# Patient Record
Sex: Female | Born: 1935 | Race: White | Hispanic: No | Smoking: Former smoker
Health system: Southern US, Community
[De-identification: ages and names within clinical notes are randomized; demographics above are authoritative.]

## PROBLEM LIST (undated history)

## (undated) DIAGNOSIS — K219 Gastro-esophageal reflux disease without esophagitis: Secondary | ICD-10-CM

## (undated) DIAGNOSIS — C3490 Malignant neoplasm of unspecified part of unspecified bronchus or lung: Secondary | ICD-10-CM

## (undated) DIAGNOSIS — E079 Disorder of thyroid, unspecified: Secondary | ICD-10-CM

## (undated) DIAGNOSIS — E039 Hypothyroidism, unspecified: Secondary | ICD-10-CM

## (undated) DIAGNOSIS — M7550 Bursitis of unspecified shoulder: Secondary | ICD-10-CM

## (undated) DIAGNOSIS — M25519 Pain in unspecified shoulder: Secondary | ICD-10-CM

## (undated) DIAGNOSIS — Z923 Personal history of irradiation: Secondary | ICD-10-CM

## (undated) DIAGNOSIS — M67919 Unspecified disorder of synovium and tendon, unspecified shoulder: Secondary | ICD-10-CM

## (undated) DIAGNOSIS — M2550 Pain in unspecified joint: Secondary | ICD-10-CM

## (undated) DIAGNOSIS — F419 Anxiety disorder, unspecified: Secondary | ICD-10-CM

## (undated) DIAGNOSIS — J189 Pneumonia, unspecified organism: Secondary | ICD-10-CM

## (undated) DIAGNOSIS — M1990 Unspecified osteoarthritis, unspecified site: Secondary | ICD-10-CM

## (undated) DIAGNOSIS — H8110 Benign paroxysmal vertigo, unspecified ear: Secondary | ICD-10-CM

## (undated) DIAGNOSIS — J90 Pleural effusion, not elsewhere classified: Secondary | ICD-10-CM

## (undated) DIAGNOSIS — I10 Essential (primary) hypertension: Secondary | ICD-10-CM

## (undated) DIAGNOSIS — E785 Hyperlipidemia, unspecified: Secondary | ICD-10-CM

## (undated) DIAGNOSIS — R32 Unspecified urinary incontinence: Secondary | ICD-10-CM

## (undated) DIAGNOSIS — M719 Bursopathy, unspecified: Secondary | ICD-10-CM

## (undated) DIAGNOSIS — Z9289 Personal history of other medical treatment: Secondary | ICD-10-CM

## (undated) DIAGNOSIS — S52539A Colles' fracture of unspecified radius, initial encounter for closed fracture: Secondary | ICD-10-CM

## (undated) DIAGNOSIS — D649 Anemia, unspecified: Secondary | ICD-10-CM

## (undated) DIAGNOSIS — C3410 Malignant neoplasm of upper lobe, unspecified bronchus or lung: Secondary | ICD-10-CM

## (undated) DIAGNOSIS — F0390 Unspecified dementia without behavioral disturbance: Secondary | ICD-10-CM

## (undated) HISTORY — DX: Anxiety disorder, unspecified: F41.9

## (undated) HISTORY — DX: Hyperlipidemia, unspecified: E78.5

## (undated) HISTORY — PX: TONSILLECTOMY: SUR1361

## (undated) HISTORY — DX: Disorder of thyroid, unspecified: E07.9

## (undated) HISTORY — PX: CATARACT EXTRACTION: SUR2

## (undated) HISTORY — PX: EYE SURGERY: SHX253

## (undated) HISTORY — PX: FRACTURE SURGERY: SHX138

## (undated) HISTORY — DX: Unspecified osteoarthritis, unspecified site: M19.90

## (undated) HISTORY — PX: ABDOMINAL HYSTERECTOMY: SHX81

## (undated) HISTORY — DX: Essential (primary) hypertension: I10

## (undated) HISTORY — PX: CARPAL TUNNEL RELEASE: SHX101

## (undated) HISTORY — PX: CHOLECYSTECTOMY: SHX55

## (undated) HISTORY — PX: APPENDECTOMY: SHX54

## (undated) HISTORY — PX: OTHER SURGICAL HISTORY: SHX169

---

## 1998-05-14 ENCOUNTER — Ambulatory Visit (HOSPITAL_COMMUNITY): Admission: RE | Admit: 1998-05-14 | Discharge: 1998-05-14 | Payer: Self-pay | Admitting: Gastroenterology

## 1998-05-14 ENCOUNTER — Encounter: Payer: Self-pay | Admitting: Gastroenterology

## 2002-06-21 ENCOUNTER — Ambulatory Visit (HOSPITAL_COMMUNITY): Admission: RE | Admit: 2002-06-21 | Discharge: 2002-06-21 | Payer: Self-pay | Admitting: General Surgery

## 2002-12-07 ENCOUNTER — Ambulatory Visit: Admission: RE | Admit: 2002-12-07 | Discharge: 2002-12-07 | Payer: Self-pay | Admitting: Orthopedic Surgery

## 2002-12-07 ENCOUNTER — Encounter: Payer: Self-pay | Admitting: Orthopedic Surgery

## 2003-08-28 ENCOUNTER — Ambulatory Visit (HOSPITAL_COMMUNITY): Admission: RE | Admit: 2003-08-28 | Discharge: 2003-08-28 | Payer: Self-pay | Admitting: Family Medicine

## 2003-12-13 ENCOUNTER — Emergency Department (HOSPITAL_COMMUNITY): Admission: EM | Admit: 2003-12-13 | Discharge: 2003-12-13 | Payer: Self-pay | Admitting: Emergency Medicine

## 2003-12-17 ENCOUNTER — Ambulatory Visit (HOSPITAL_COMMUNITY): Admission: RE | Admit: 2003-12-17 | Discharge: 2003-12-17 | Payer: Self-pay | Admitting: Orthopedic Surgery

## 2003-12-17 ENCOUNTER — Encounter: Payer: Self-pay | Admitting: Orthopedic Surgery

## 2003-12-26 ENCOUNTER — Encounter (HOSPITAL_COMMUNITY): Admission: RE | Admit: 2003-12-26 | Discharge: 2003-12-27 | Payer: Self-pay | Admitting: Orthopedic Surgery

## 2004-01-01 ENCOUNTER — Encounter (HOSPITAL_COMMUNITY): Admission: RE | Admit: 2004-01-01 | Discharge: 2004-01-31 | Payer: Self-pay | Admitting: Orthopedic Surgery

## 2004-02-04 ENCOUNTER — Encounter (HOSPITAL_COMMUNITY): Admission: RE | Admit: 2004-02-04 | Discharge: 2004-03-05 | Payer: Self-pay | Admitting: Orthopedic Surgery

## 2004-03-12 ENCOUNTER — Ambulatory Visit: Payer: Self-pay | Admitting: Orthopedic Surgery

## 2004-06-08 ENCOUNTER — Ambulatory Visit: Payer: Self-pay | Admitting: Orthopedic Surgery

## 2004-07-01 ENCOUNTER — Ambulatory Visit (HOSPITAL_COMMUNITY): Admission: RE | Admit: 2004-07-01 | Discharge: 2004-07-01 | Payer: Self-pay | Admitting: Family Medicine

## 2004-10-21 ENCOUNTER — Ambulatory Visit (HOSPITAL_COMMUNITY): Admission: RE | Admit: 2004-10-21 | Discharge: 2004-10-21 | Payer: Self-pay | Admitting: Internal Medicine

## 2004-10-21 ENCOUNTER — Ambulatory Visit: Payer: Self-pay | Admitting: Internal Medicine

## 2004-12-11 ENCOUNTER — Ambulatory Visit (HOSPITAL_COMMUNITY): Admission: RE | Admit: 2004-12-11 | Discharge: 2004-12-11 | Payer: Self-pay | Admitting: Family Medicine

## 2005-06-07 ENCOUNTER — Ambulatory Visit: Payer: Self-pay | Admitting: Orthopedic Surgery

## 2006-05-20 ENCOUNTER — Ambulatory Visit (HOSPITAL_COMMUNITY): Admission: RE | Admit: 2006-05-20 | Discharge: 2006-05-20 | Payer: Self-pay | Admitting: Family Medicine

## 2006-10-21 ENCOUNTER — Ambulatory Visit (HOSPITAL_COMMUNITY): Admission: RE | Admit: 2006-10-21 | Discharge: 2006-10-21 | Payer: Self-pay | Admitting: Family Medicine

## 2006-11-03 ENCOUNTER — Ambulatory Visit: Payer: Self-pay | Admitting: Orthopedic Surgery

## 2006-12-01 ENCOUNTER — Ambulatory Visit: Payer: Self-pay | Admitting: Orthopedic Surgery

## 2007-06-27 ENCOUNTER — Ambulatory Visit: Payer: Self-pay | Admitting: Orthopedic Surgery

## 2007-06-27 DIAGNOSIS — M1710 Unilateral primary osteoarthritis, unspecified knee: Secondary | ICD-10-CM

## 2007-06-27 DIAGNOSIS — IMO0002 Reserved for concepts with insufficient information to code with codable children: Secondary | ICD-10-CM | POA: Insufficient documentation

## 2008-03-18 ENCOUNTER — Ambulatory Visit: Payer: Self-pay | Admitting: Orthopedic Surgery

## 2008-03-18 DIAGNOSIS — M25539 Pain in unspecified wrist: Secondary | ICD-10-CM

## 2008-05-24 ENCOUNTER — Ambulatory Visit (HOSPITAL_COMMUNITY): Admission: RE | Admit: 2008-05-24 | Discharge: 2008-05-24 | Payer: Self-pay | Admitting: Family Medicine

## 2008-06-05 ENCOUNTER — Encounter (HOSPITAL_COMMUNITY): Admission: RE | Admit: 2008-06-05 | Discharge: 2008-07-05 | Payer: Self-pay | Admitting: Family Medicine

## 2008-06-26 ENCOUNTER — Ambulatory Visit (HOSPITAL_COMMUNITY): Admission: RE | Admit: 2008-06-26 | Discharge: 2008-06-26 | Payer: Self-pay | Admitting: Urology

## 2008-12-03 ENCOUNTER — Ambulatory Visit: Payer: Self-pay | Admitting: Orthopedic Surgery

## 2008-12-03 DIAGNOSIS — S52539A Colles' fracture of unspecified radius, initial encounter for closed fracture: Secondary | ICD-10-CM | POA: Insufficient documentation

## 2008-12-03 HISTORY — DX: Colles' fracture of unspecified radius, initial encounter for closed fracture: S52.539A

## 2009-09-23 ENCOUNTER — Ambulatory Visit (HOSPITAL_COMMUNITY): Admission: RE | Admit: 2009-09-23 | Discharge: 2009-09-23 | Payer: Self-pay | Admitting: Family Medicine

## 2009-09-24 ENCOUNTER — Ambulatory Visit: Payer: Self-pay | Admitting: Orthopedic Surgery

## 2009-12-31 ENCOUNTER — Ambulatory Visit: Payer: Self-pay | Admitting: Orthopedic Surgery

## 2010-01-27 ENCOUNTER — Encounter: Payer: Self-pay | Admitting: Orthopedic Surgery

## 2010-03-12 ENCOUNTER — Ambulatory Visit (HOSPITAL_COMMUNITY)
Admission: RE | Admit: 2010-03-12 | Discharge: 2010-03-12 | Payer: Self-pay | Source: Home / Self Care | Attending: Ophthalmology | Admitting: Ophthalmology

## 2010-04-18 ENCOUNTER — Encounter: Payer: Self-pay | Admitting: Family Medicine

## 2010-04-20 ENCOUNTER — Ambulatory Visit (HOSPITAL_COMMUNITY)
Admission: RE | Admit: 2010-04-20 | Discharge: 2010-04-20 | Payer: Self-pay | Source: Home / Self Care | Attending: Ophthalmology | Admitting: Ophthalmology

## 2010-04-21 LAB — GLUCOSE, CAPILLARY: Glucose-Capillary: 93 mg/dL (ref 70–99)

## 2010-04-28 NOTE — Assessment & Plan Note (Signed)
Summary: WANTS INJECT IN BOTH KNEES XR  HERE  JUNE  '11/MCR/TRICARE/BSF   Visit Type:  Follow-up  CC:  bilateral knee pain.  History of Present Illness: I saw Peggy Espinoza in the office today for a followup visit.  She is a 77 years old woman with the complaint of:  knee pain, left is worse.  She has a history of osteoarthritis and today she is requesting injections  09/24/09 last injections bilateral knees, lasted for 3 months.  Meds: Synthroid, Metformin, Nexium, Advil as needed, helps.   bilateral knee pain LEFT greater than RIGHT patient would like to have 2 injections.  We also discussed her reluctance to have knee replacement surgery and this is secondary to a relative of her dying after knee surgery from a pulmonary embolus and then a friend of hers also had fracture of her knee area and also had the same issue  I advised her that we do for different things to prevent embolus including early mobilization, TED hose stockings, or medication and foot pumps  We then injected both knee joints using the following technique:  Verbal consent was obtained. The knee was prepped with alcohol and ethyl chloride. 1 cc of depomedrol 40mg /cc and 4 cc of lidocaine 1% was injected. there were no complications.          Allergies: No Known Drug Allergies   Impression & Recommendations:  Problem # 1:  KNEE, ARTHRITIS, DEGEN./OSTEO (ICD-715.96) Assessment Deteriorated  Orders: Joint Aspirate / Injection, Large (20610) Depo- Medrol 40mg  (J1030)  Patient Instructions: 1)  Please schedule a follow-up appointment as needed.

## 2010-04-28 NOTE — Letter (Signed)
Summary: [facility] medical record request  [facility] medical record request   Imported By: Jacklynn Ganong 02/26/2010 14:15:59  _____________________________________________________________________  External Attachment:    Type:   Image     Comment:   External Document

## 2010-04-28 NOTE — Assessment & Plan Note (Signed)
Summary: REQ INJECT IN BOTH KNEES NO RECENT XR/[facility]/TRICARE/BSF   Visit Type:  Follow-up  CC:  knee pain.  History of Present Illness: I saw Peggy Espinoza in the office today for a followup visit.  She is a 77 years old woman with the complaint of:  knee pain,   She has a history of osteoarthritis and today she is requesting injections  Last injections 02/2008.  Meds: Synthroid, Metformin, Nexium, Advil helps.  X-rays will be obtained as well. Both knees.      Allergies: No Known Drug Allergies  Past History:  Past Medical History: Last updated: 06/27/2007 high cholesterol nerves headaches knee pain  Past Surgical History: Last updated: 06/27/2007 Appendectomy hysterectomy tumor removed from side Tonsillectomy OTIF right wrist  Review of Systems       RIGHT wrist prominent screw from volar plating, not bothering her. Right now, she did ask if she needed a new x-ray, but we decided, that she's not having any surgery or having it removed. No need for extra radiation. No other difficulties in the wrist.    Physical Exam  Additional Exam:  The patient is well developed and nourished, with normal grooming and hygiene. The body habitus is Medium  The pulses and perfusion were normal with normal color, temperature  and no swelling In both lower extremities  skin normal in both lower extremities  RIGHT knee mild varus deformity, mild medial joint line tenderness. No effusion, mild flexion contracture, flexion, 120, knee stable  LEFT knee, mild varus deformity, medial joint line tenderness, mild effusion, mild flexion contracture, flexion, 120 knee is stable    Impression & Recommendations:  Problem # 1:  KNEE, ARTHRITIS, DEGEN./OSTEO (ICD-715.96) Assessment Deteriorated  AP and lateral with standing films, including patellofemoral views of both knees show LEFT knee has bone to bone changes with medial joint space na, alignment, varus, mild.  RIGHT  knee, not as bad, but still joint space compromise medially less varus.  Patellofemoral joints are aligned properly on the sunrise view.  Compared to previous films. There appears to be decreased joint space on the LEFT knee.   Orders: Est. Patient Level IV (04540) Depo- Medrol 40mg  (J1030) Joint Aspirate / Injection, Large (20610) Knees  x-ray bilateral ([id number]) Verbal consent was obtained. The knee was prepped with alcohol and ethyl chloride. 1 cc of depomedrol 40mg /cc and 4 cc of lidocaine 1% was injected. there were no complications.  both knees   Patient Instructions: 1)  You have received an injection of cortisone today. You may experience increased pain at the injection site. Apply ice pack to the area for 20 minutes every 2 hours and take 2 xtra strength tylenol every 8 hours. This increased pain will usually resolve in 24 hours. The injection will take effect in 3-10 days.  2)  follow up as needed

## 2010-06-08 LAB — BASIC METABOLIC PANEL
Calcium: 9.8 mg/dL (ref 8.4–10.5)
Chloride: 101 mEq/L (ref 96–112)

## 2010-06-08 LAB — HEMOGLOBIN AND HEMATOCRIT, BLOOD: HCT: 37.2 % (ref 36.0–46.0)

## 2010-08-14 NOTE — Op Note (Signed)
NAMEKEYONNI, PERCIVAL                 ACCOUNT NO.:  [id number]   MEDICAL RECORD NO.:  [id number]          PATIENT TYPE:  AMB   LOCATION:  DAY                           FACILITY:  [facility]   PHYSICIAN:  Vickki Hearing, M.D.DATE OF BIRTH:  Jan 28, 1930   DATE OF PROCEDURE:  DATE OF DISCHARGE:                                 OPERATIVE REPORT   A 77 year old female fell and fractured her right distal radius sustaining a  Smith's fracture. She was placed in a splint and scheduled for surgery.   INDICATIONS FOR PROCEDURE:  Displaced unstable right distal radius fracture.   PREOPERATIVE DIAGNOSIS:  Right distal radius fracture.   POSTOPERATIVE DIAGNOSIS:  Right distal radius fracture.   PROCEDURE:  Open treatment internal fixation with volar plate and a Stryker-  Leibinger plate set.   SURGEON:  Romeo Apple.   ANESTHESIA:  LMA general.   OPERATIVE FINDINGS:  Comminuted intra-articular distal radius fracture with  volar fragment.   DETAILS OF PROCEDURE:  The patient was identified in the preoperative  holding area. Her right upper extremity was marked as the surgical site. My  initials were placed over the surgical site as well. She was given  clindamycin due to penicillin allergy and taken to the operating room for  LMA general anesthesia. Closed manipulation of the wrist was obtained prior  to surgery. The right upper extremity was prepped and draped in the usual  sterile technique. A mandatory time out was taken. The procedure was  confirmed as open treatment internal fixation right distal radius on Jackee  Munday. After exsanguination of the limb and elevation of the tourniquet, a  volar incision was made over the FCR tendon. The radial artery was  protected. The dissection was carried down bluntly to the pronator quadratus  which was elevated from the distal radius. Open manipulation was performed.  The plate secured the reduction. Two screws were placed using a 2.0 drill  bit.  Radiographs confirmed the position of the hardware and the fracture  reduction in AP and lateral views. The wound was irrigated, closed with 2-0  Vicryl and staples. A volar splint was  applied, and the tourniquet was deflated. The patient was extubated, taken  to recovery room in stable condition. Plan is for her to go home today on  Lorcet Plus. She will follow up in 2 days for a dressing change. She will  start rehab after staples are taken out in 10 days.      SEH/MEDQ  D:  12/17/2003  T:  12/17/2003  Job:  [id number]

## 2010-08-14 NOTE — Op Note (Signed)
NAMEJEMEKA, WAGLER                 ACCOUNT NO.:  [id number]   MEDICAL RECORD NO.:  [id number]          PATIENT TYPE:  AMB   LOCATION:  DAY                           FACILITY:  [facility]   PHYSICIAN:  Lionel December, M.D.    DATE OF BIRTH:  Apr 21, 1929   DATE OF PROCEDURE:  10/21/2004  DATE OF DISCHARGE:                                 OPERATIVE REPORT   PROCEDURE:  Colonoscopy.   INDICATION:  Markeeta is a 77 year old Caucasian female who is here for  surveillance colonoscopy. She had a tubular adenoma removed in September  1998. She another polyp removed from cecum which was hyperplastic. She  apparently has gone eight years without an exam. Her father died of colon  carcinoma at age 22. Procedure risks were reviewed with the patient, and  informed consent was obtained.   PREMEDICATION:  Demerol 50 mg IV, Versed 12 mg IV.   FINDINGS:  Procedure performed in endoscopy suite. The patient's vital signs  and O2 saturation were monitored during the procedure and remained stable.  The patient was placed in left lateral position and rectal examination  performed. No abnormality noted on external or digital exam. Olympus  videoscope was placed in rectum and advanced under vision into sigmoid colon  beyond. Preparation was satisfactory. Redundant sigmoid colon. Scope had to  be withdrawn multiple times in order to prevent loop formation. Finally,  this was possible. Endoscope was advanced to cecum which was identified by  ileocecal valve. Blunt cecum was well seen. She had some stool which was  washed away. Pictures taken for the record. As the scope was withdrawn,  colonic mucosa was once again carefully examined. There was scattered small  diverticula throughout the colon, few in number, but there were no polyps or  other abnormalities. The rectal mucosa was normal. Scope was retroflexed to  examine anorectal junction and small hemorrhoids were noted below the  dentate line.  Endoscope was  straightened and withdrawn. The patient  tolerated the procedure well.   FINAL DIAGNOSIS:  Few small scattered diverticula throughout colon and small  external hemorrhoids.   No evidence of recurrent polyps.   RECOMMENDATIONS:  1.  High-fiber diet.  2.  She may consider next exam in five years given history of polyps and      family history of colon carcinoma.      NR/MEDQ  D:  10/21/2004  T:  10/21/2004  Job:  [id number]

## 2010-08-14 NOTE — H&P (Signed)
   NAMEAIDEN, RAO                             ACCOUNT NO.:  [id number]   MEDICAL RECORD NO.:  [id number]                  PATIENT TYPE:   LOCATION:                                       FACILITY:   PHYSICIAN:  Dalia Heading, M.D.               DATE OF BIRTH:  05-06-1929   DATE OF ADMISSION:  DATE OF DISCHARGE:                                HISTORY & PHYSICAL   CHIEF COMPLAINT:  History of colon polyps.  Family history of colon  carcinoma.   HISTORY OF PRESENT ILLNESS:  The patient is a 77 year old white female who  is referred for evaluation and treatment of a history of colon polyps and a  family history of colon carcinoma.  Her last colonoscopy was in 1999.  Her  father had a history of colon cancer. She denies any abdominal complaints.   PAST MEDICAL HISTORY:  Her past medical history includes hypothyroidism and  high cholesterol levels.   PAST SURGICAL HISTORY:  Hysterectomy, appendectomy, and tonsillectomy.   CURRENT MEDICATIONS:  1. Synthroid.  2. Zocor.  3. Glucosamine.   ALLERGIES:  No known drug allergies.   REVIEW OF SYSTEMS:  Noncontributory.   PHYSICAL EXAMINATION:  GENERAL:  On physical examination, the patient is a  well-developed well-nourished white female in no acute distress.  VITAL SIGNS:  She is afebrile and vital signs are stable.  LUNGS:  Clear to auscultation with equal breath sounds bilaterally.  HEART:  Heart examination reveals a regular rate and rhythm without S3, S4,  or murmurs.  ABDOMEN:  The abdomen is soft, nontender, nondistended.  No  hepatosplenomegaly, masses, or hernias are identified.  RECTAL:  Examination was deferred to the procedure.   IMPRESSION:  1. History of colon polyps.  2. Family history of colon carcinoma.    PLAN:  The patient is scheduled for a colonoscopy on 06/19/2002.  The risks  and benefits of the procedure including bleeding and perforation were fully  explained to the patient, who gave informed  consent.                                               Dalia Heading, M.D.    MAJ/MEDQ  D:  06/12/2002  T:  06/12/2002  Job:  [id number]   cc:   Kirk Ruths, M.D.  [address]  [place]  [place] [address]  Fax: [phone]

## 2011-01-08 ENCOUNTER — Other Ambulatory Visit (HOSPITAL_COMMUNITY): Payer: Self-pay | Admitting: Family Medicine

## 2011-01-08 DIAGNOSIS — Z139 Encounter for screening, unspecified: Secondary | ICD-10-CM

## 2011-01-11 ENCOUNTER — Other Ambulatory Visit (HOSPITAL_COMMUNITY): Payer: Self-pay

## 2011-01-15 ENCOUNTER — Ambulatory Visit (HOSPITAL_COMMUNITY)
Admission: RE | Admit: 2011-01-15 | Discharge: 2011-01-15 | Disposition: A | Discharge status: Home / Self Care | Payer: Medicare Other | Source: Ambulatory Visit | Attending: Family Medicine | Admitting: Family Medicine

## 2011-01-15 DIAGNOSIS — M818 Other osteoporosis without current pathological fracture: Secondary | ICD-10-CM | POA: Insufficient documentation

## 2011-01-15 DIAGNOSIS — Z780 Asymptomatic menopausal state: Secondary | ICD-10-CM | POA: Insufficient documentation

## 2011-01-15 DIAGNOSIS — Z139 Encounter for screening, unspecified: Secondary | ICD-10-CM

## 2011-01-26 ENCOUNTER — Encounter: Payer: Self-pay | Admitting: Orthopedic Surgery

## 2011-01-26 ENCOUNTER — Ambulatory Visit (INDEPENDENT_AMBULATORY_CARE_PROVIDER_SITE_OTHER): Payer: Medicare Other | Admitting: Orthopedic Surgery

## 2011-01-26 VITALS — BP 130/90 | Ht 63.5 in | Wt 153.0 lb

## 2011-01-26 DIAGNOSIS — M25519 Pain in unspecified shoulder: Secondary | ICD-10-CM

## 2011-01-26 HISTORY — DX: Pain in unspecified shoulder: M25.519

## 2011-01-26 MED ORDER — HYDROCODONE-ACETAMINOPHEN 5-325 MG PO TABS: 1.0000 | ORAL_TABLET | Freq: Four times a day (QID) | ORAL | Status: AC | PRN

## 2011-01-26 MED ORDER — METHYLPREDNISOLONE ACETATE 40 MG/ML IJ SUSP: 40.0000 mg | Freq: Once | INTRAMUSCULAR | Status: DC

## 2011-01-26 NOTE — Patient Instructions (Signed)
You have received a steroid shot. 15% of patients experience increased pain at the injection site with in the next 24 hours. This is best treated with ice and tylenol extra strength 2 tabs every 8 hours. If you are still having pain please call the office.    

## 2011-01-26 NOTE — Progress Notes (Signed)
RIGHT shoulder pain x2 weeks.  The patient says that she fell out of the bed. Her RIGHT shoulder hit the corner of the end table, and she's had pain and loss of motion since been unrelieved by N. Phil  Review of systems easy bleeding and bruising seasonal allergies, joint pain, the other 11 systems are normal.  Past Medical History  Diagnosis Date  . HTN (hypertension)   . Hyperlipidemia   . Thyroid disease   . Diabetes mellitus     Past Surgical History  Procedure Date  . Arm fracture surgery   . Tonsillectomy   . Appendectomy   . Abdominal hysterectomy   . Lipoma removal   . Eye surgery     Physical Exam(12) GENERAL: normal development   CDV: pulses are normal   Skin: normal  Lymph: nodes were not palpable/normal  Psychiatric: awake, alert and oriented  Neuro: normal sensation  MSK Ambulation is normal 1 RIGHT shoulder is nontender over the a.c. Joint and subacromial space 2 Painful forward elevation, passive range of motion, 150 3 Mild weakness. Rotator cuff 4 No instability 5 Positive impingement sign 6 Negative abduction sign  Separate x-ray report 2 views, RIGHT shoulder There is no gross deformity or fracture. There is calcification in the subacromial space with a type II acromion.  Impression no acute fracture  Assessment: Shoulder pain with possible impingement syndrome, posttraumatic    Plan: Injection oral analgesic  Shoulder Injection Procedure Note   Pre-operative Diagnosis: right  RC Syndrome  Post-operative Diagnosis: same  Indications: pain   Anesthesia: ethyl chloride   Procedure Details   Verbal consent was obtained for the procedure. The shoulder was prepped withalcohol and the skin was anesthetized. A 20 gauge needle was advanced into the subacromial space through posterior approach without difficulty  The space was then injected with 3 ml 1% lidocaine and 1 ml of depomedrol. The injection site was cleansed with isopropyl  alcohol and a dressing was applied.  Complications:  None; patient tolerated the procedure well.

## 2011-03-10 ENCOUNTER — Encounter: Payer: Self-pay | Admitting: Orthopedic Surgery

## 2011-03-10 ENCOUNTER — Ambulatory Visit (INDEPENDENT_AMBULATORY_CARE_PROVIDER_SITE_OTHER): Payer: TRICARE For Life (TFL) | Admitting: Orthopedic Surgery

## 2011-03-10 VITALS — BP 110/60 | Ht 63.5 in | Wt 153.0 lb

## 2011-03-10 DIAGNOSIS — M5410 Radiculopathy, site unspecified: Secondary | ICD-10-CM

## 2011-03-10 DIAGNOSIS — M5412 Radiculopathy, cervical region: Secondary | ICD-10-CM

## 2011-03-10 MED ORDER — HYDROCODONE-ACETAMINOPHEN 5-325 MG PO TABS: 1.0000 | ORAL_TABLET | Freq: Four times a day (QID) | ORAL | Status: AC | PRN

## 2011-03-10 MED ORDER — PREDNISONE 10 MG PO KIT: 10.0000 mg | PACK | ORAL | Status: DC

## 2011-03-10 MED ORDER — GABAPENTIN 100 MG PO CAPS: 100.0000 mg | ORAL_CAPSULE | Freq: Three times a day (TID) | ORAL | Status: DC

## 2011-03-10 NOTE — Patient Instructions (Signed)
Take new meds as directed

## 2011-03-10 NOTE — Progress Notes (Signed)
Right shoulder pain  Right shoulder injection did not tell  Complaint of right arm pain radiating to the right wrist  Normal forward elevation without pain normal range across the chest without pain  Denies neck pain  Reexamination  Normal appearance, alert and oriented x3 mood and affect normal. Ambulation normal. Right shoulder full range of motion. Negative impingement sign. Negative Hawkins sign. Negative cross chest abduction test. Range of motion elbow wrist and hand.  Suspect radicular pain  Recommend Norco, steroid Dosepak and gabapentin. Followup Monday.

## 2011-03-15 ENCOUNTER — Ambulatory Visit (INDEPENDENT_AMBULATORY_CARE_PROVIDER_SITE_OTHER): Payer: TRICARE For Life (TFL) | Admitting: Orthopedic Surgery

## 2011-03-15 ENCOUNTER — Encounter: Payer: Self-pay | Admitting: Orthopedic Surgery

## 2011-03-15 DIAGNOSIS — M25519 Pain in unspecified shoulder: Secondary | ICD-10-CM

## 2011-03-15 DIAGNOSIS — IMO0002 Reserved for concepts with insufficient information to code with codable children: Secondary | ICD-10-CM

## 2011-03-15 DIAGNOSIS — M792 Neuralgia and neuritis, unspecified: Secondary | ICD-10-CM

## 2011-03-15 NOTE — Patient Instructions (Signed)
Continue the gabapentin 100 mg 3 times a day

## 2011-03-15 NOTE — Progress Notes (Signed)
Patient ID: Peggy Espinoza, female   DOB: Jan 26, 1930, 77 y.o.   MRN: [id number] Visit after starting oral Neurontin and prednisone Dosepak for RIGHT upper extremity pain suspected radiculopathy  Patient notes major improvement just some stiffness now in the shoulder most of her arm pain has resolved she is getting a little bit sleepy from the medication.  But today she has full forward elevation.  Continue medication Neurontin 100 mg 3 times a day until this bottle has been finished.

## 2011-04-07 ENCOUNTER — Encounter: Payer: Self-pay | Admitting: Orthopedic Surgery

## 2011-04-07 ENCOUNTER — Ambulatory Visit (INDEPENDENT_AMBULATORY_CARE_PROVIDER_SITE_OTHER): Payer: Medicare Other | Admitting: Orthopedic Surgery

## 2011-04-07 VITALS — BP 100/60 | Ht 63.5 in | Wt 153.0 lb

## 2011-04-07 DIAGNOSIS — S43429A Sprain of unspecified rotator cuff capsule, initial encounter: Secondary | ICD-10-CM

## 2011-04-07 DIAGNOSIS — M75100 Unspecified rotator cuff tear or rupture of unspecified shoulder, not specified as traumatic: Secondary | ICD-10-CM

## 2011-04-07 MED ORDER — PREDNISONE 5 MG PO KIT: 5.0000 mg | PACK | ORAL | Status: DC

## 2011-04-07 MED ORDER — HYDROCODONE-ACETAMINOPHEN 5-500 MG PO TABS: 1.0000 | ORAL_TABLET | ORAL | Status: DC | PRN

## 2011-04-07 MED ORDER — GABAPENTIN 100 MG PO CAPS: 100.0000 mg | ORAL_CAPSULE | Freq: Three times a day (TID) | ORAL | Status: DC

## 2011-04-07 NOTE — Progress Notes (Signed)
Patient ID: Peggy Espinoza, female   DOB: 1929-08-23, 78 y.o.   MRN: [id number] Chief Complaint  Patient presents with  . Follow-up    Still having right shoulder pain, meds did not help.   She says the medications did not help but needed help her when she stopped taking them her pain came back.  Sheet she complains of pain over the RIGHT deltoid radiating down the RIGHT arm into the RIGHT wrist, she complains of loss of motion, she describes the pain as a dull ache, she rates the pain as severe, she reports weakness in the RIGHT upper extremity.  Review of systems she denies neck pain, numbness or tendon  Past Medical History  Diagnosis Date  . HTN (hypertension)   . Hyperlipidemia   . Thyroid disease   . Diabetes mellitus     Exam she is a well-developed well-nourished female who is awake alert and oriented x3 her mood and affect are normal.  She has normal ambulation.  His cervical spine is nontender with no deformity range of motion is full and supple with no instability and normal muscle tone the scan is normal without rash or pigmented lesion  The RIGHT shoulder is tender over the lateral and anterior deltoid musculature with decreased active range of motion.  Passive external rotation 45, passive abduction 45, passive forward elevation 130.  Shoulder stability normal.  Strength could not be assessed in the shoulder cuff muscles.  Skin intact.  Distally her pulses were normal, the temperature of the arm was normal.  There is no edema, swelling or varicosities  The lymph nodes in the axilla were normal the supraclavicular region normal.  Sensation was normal.  Pathologic reflexes such as a Hoffman test were normal.  Overall balance was normal.  X-rays were taken prior to this and are included in previous notes there was no glenohumeral arthritis  Impression differential diagnosis of rotator cuff syndrome still not resolved vs. Partial thickness rotator cuff tear.  Note, I was able to  elevate the arm to 130 but she had significant pain and giving way when the arm was brought down to her side-this is indicative of partial tearing of the cuff usually on the articular surface side.  We repeated her injection and started her on Vicodin, repeated her prednisone and Neurontin which she responded to well last time  Recommend MRI of RIGHT shoulder to evaluate the rotator cuff repair  Return 2 weeks  Open unit needed. Subacromial Shoulder Injection Procedure Note  Pre-operative Diagnosis: right shoulder pain , RC SYNDROME , ? tear  Post-operative Diagnosis: same  Indications: pain   Anesthesia: not required na  Procedure Details   Verbal consent was obtained for the procedure. The shoulder was prepped withalcohol and the skin was anesthetized. A 20 gauge needle was advanced into the subacromial space through posterior approach without difficulty  The space was then injected with 3 ml 1% lidocaine and 1 ml of depomedrol. The injection site was cleansed with isopropyl alcohol and a dressing was applied.  Complications:  None; patient tolerated the procedure well.

## 2011-04-07 NOTE — Patient Instructions (Signed)
MRI OF YOUR SHOULDER WE WILL CALL YOU WITH YOUR APPOINTMENT

## 2011-04-26 ENCOUNTER — Telehealth: Payer: Self-pay | Admitting: Radiology

## 2011-04-26 NOTE — Telephone Encounter (Signed)
I called to give the patient her MRI appointment at [facility] on 05-03-11 at 1:45. Patient has [employer], authorization [id number]. Patient will follow up back at our office to go over her results.

## 2011-04-27 ENCOUNTER — Ambulatory Visit: Payer: Medicare Other | Admitting: Orthopedic Surgery

## 2011-04-30 ENCOUNTER — Encounter: Payer: Self-pay | Admitting: Radiology

## 2011-04-30 NOTE — Telephone Encounter (Signed)
error 

## 2011-05-03 ENCOUNTER — Ambulatory Visit
Admission: RE | Admit: 2011-05-03 | Discharge: 2011-05-03 | Disposition: A | Discharge status: Home / Self Care | Payer: Medicare Other | Source: Ambulatory Visit | Attending: Orthopedic Surgery | Admitting: Orthopedic Surgery

## 2011-05-03 DIAGNOSIS — M75100 Unspecified rotator cuff tear or rupture of unspecified shoulder, not specified as traumatic: Secondary | ICD-10-CM

## 2011-05-06 ENCOUNTER — Ambulatory Visit (INDEPENDENT_AMBULATORY_CARE_PROVIDER_SITE_OTHER): Payer: Medicare Other | Admitting: Orthopedic Surgery

## 2011-05-06 ENCOUNTER — Encounter: Payer: Self-pay | Admitting: Orthopedic Surgery

## 2011-05-06 VITALS — Ht 63.5 in | Wt 153.0 lb

## 2011-05-06 DIAGNOSIS — S43429A Sprain of unspecified rotator cuff capsule, initial encounter: Secondary | ICD-10-CM

## 2011-05-06 DIAGNOSIS — M75100 Unspecified rotator cuff tear or rupture of unspecified shoulder, not specified as traumatic: Secondary | ICD-10-CM

## 2011-05-06 NOTE — Progress Notes (Signed)
Subjective:     Patient ID: Peggy Espinoza, female   DOB: Aug 12, 1929, 78 y.o.   MRN: [id number]  HPI Followup  Diagnosis rotator cuff tear RIGHT shoulder   Review of Systems     Objective:   Physical Exam Active range of motion of 150 of flexion with weakness in the supraspinatus    Assessment:     MRI shows complete supraspinatus tendon tear with retraction to the top of the humeral head estimated 4 cm no atrophy.  There is tendinitis and tendinopathy of the infraspinatus and a.c. Joint arthrosis mild, type III acromion with subacromial spur.    Plan:     The patient feels that she has recovered significantly and has only intermittent pain.  She can perform for elevation with mild discomfort and would like to continue nonoperative treatment.  On agreement with this.  We discussed her surgical options.  She will need skilled care and if she decides to have surgery.  If things get worse she will call us back.

## 2011-05-06 NOTE — Patient Instructions (Signed)
Prn

## 2011-06-22 ENCOUNTER — Telehealth: Payer: Self-pay | Admitting: Orthopedic Surgery

## 2011-06-22 NOTE — Telephone Encounter (Signed)
Patient had stopped into office to relay that the same shoulder is hurting again.  Notes that Dr. Romeo Apple had discussed options at last visit to continue non-surgical treatment and to return as needed.  She is requesting another injection if possible, and would like to "talk with Dr. Romeo Apple."  Please advise.  Her home ph# is [phone].

## 2011-06-23 NOTE — Telephone Encounter (Signed)
Ill have to see her when I come back

## 2011-06-23 NOTE — Telephone Encounter (Signed)
Called back to patient, appointment scheduled. °

## 2011-07-12 ENCOUNTER — Ambulatory Visit (INDEPENDENT_AMBULATORY_CARE_PROVIDER_SITE_OTHER): Payer: Medicare Other | Admitting: Orthopedic Surgery

## 2011-07-12 ENCOUNTER — Encounter: Payer: Self-pay | Admitting: Orthopedic Surgery

## 2011-07-12 VITALS — BP 100/70 | Ht 63.5 in | Wt 158.0 lb

## 2011-07-12 DIAGNOSIS — M5410 Radiculopathy, site unspecified: Secondary | ICD-10-CM

## 2011-07-12 DIAGNOSIS — S43429A Sprain of unspecified rotator cuff capsule, initial encounter: Secondary | ICD-10-CM

## 2011-07-12 DIAGNOSIS — M75100 Unspecified rotator cuff tear or rupture of unspecified shoulder, not specified as traumatic: Secondary | ICD-10-CM

## 2011-07-12 MED ORDER — PREDNISONE 5 MG PO KIT: 5.0000 mg | PACK | ORAL | Status: DC

## 2011-07-12 NOTE — Patient Instructions (Signed)
You have received a steroid shot. 15% of patients experience increased pain at the injection site with in the next 24 hours. This is best treated with ice and tylenol extra strength 2 tabs every 8 hours. If you are still having pain please call the office.    

## 2011-07-12 NOTE — Progress Notes (Signed)
Patient ID: Raelyn Number, female   DOB: Jul 25, 1929, 78 y.o.   MRN: [id number] Chief Complaint  Patient presents with  . Follow-up    recheck right shoulder, still hurting   Followup RIGHT shoulder MRI showed complete supraspinatus tendon tear with retraction estimated 4 cm no atrophy  Still complains of RIGHT shoulder pain  Has good forward elevation and she does have some strength in the cuff.  She also complained of some knee pain but basically she would like another injection  Shoulder Injection Procedure Note   Pre-operative Diagnosis: right  RC Syndrome  Post-operative Diagnosis: same  Indications: pain   Anesthesia: ethyl chloride   Procedure Details   Verbal consent was obtained for the procedure. The shoulder was prepped withalcohol and the skin was anesthetized. A 20 gauge needle was advanced into the subacromial space through posterior approach without difficulty  The space was then injected with 3 ml 1% lidocaine and 1 ml of depomedrol. The injection site was cleansed with isopropyl alcohol and a dressing was applied.  Complications:  None; patient tolerated the procedure well.

## 2011-10-06 ENCOUNTER — Ambulatory Visit (INDEPENDENT_AMBULATORY_CARE_PROVIDER_SITE_OTHER): Payer: Medicare Other | Admitting: Orthopedic Surgery

## 2011-10-06 ENCOUNTER — Encounter: Payer: Self-pay | Admitting: Orthopedic Surgery

## 2011-10-06 VITALS — BP 90/60 | Ht 63.5 in | Wt 157.0 lb

## 2011-10-06 DIAGNOSIS — M1710 Unilateral primary osteoarthritis, unspecified knee: Secondary | ICD-10-CM

## 2011-10-06 DIAGNOSIS — M179 Osteoarthritis of knee, unspecified: Secondary | ICD-10-CM | POA: Insufficient documentation

## 2011-10-06 NOTE — Progress Notes (Signed)
Patient ID: Peggy Espinoza, female   DOB: 30-Aug-1929, 78 y.o.   MRN: [id number] Chief Complaint  Patient presents with  . Injections    requests bilateral knee injections    BP 90/60  Ht 5' 3.5" (1.613 m)  Wt 157 lb (71.215 kg)  BMI 27.38 kg/m2  Knee  Injection Procedure Note  Pre-operative Diagnosis: left knee oa  Post-operative Diagnosis: same  Indications: pain  Anesthesia: ethyl chloride   Procedure Details   Verbal consent was obtained for the procedure. Time out was completed.The joint was prepped with alcohol, followed by  Ethyl chloride spray and A 20 gauge needle was inserted into the knee via lateral approach; 4ml 1% lidocaine and 1 ml of depomedrol  was then injected into the joint . The needle was removed and the area cleansed and dressed.  Complications:  None; patient tolerated the procedure well. Knee  Injection Procedure Note  Pre-operative Diagnosis: right knee oa  Post-operative Diagnosis: same  Indications: pain  Anesthesia: ethyl chloride   Procedure Details   Verbal consent was obtained for the procedure. Time out was completed.The joint was prepped with alcohol, followed by  Ethyl chloride spray and A 20 gauge needle was inserted into the knee via lateral approach; 4ml 1% lidocaine and 1 ml of depomedrol  was then injected into the joint . The needle was removed and the area cleansed and dressed.  Complications:  None; patient tolerated the procedure well.

## 2012-01-06 ENCOUNTER — Encounter: Payer: Self-pay | Admitting: Orthopedic Surgery

## 2012-01-06 ENCOUNTER — Ambulatory Visit (INDEPENDENT_AMBULATORY_CARE_PROVIDER_SITE_OTHER): Payer: Medicare Other | Admitting: Orthopedic Surgery

## 2012-01-06 VITALS — BP 88/60 | Ht 63.5 in | Wt 157.0 lb

## 2012-01-06 DIAGNOSIS — M1710 Unilateral primary osteoarthritis, unspecified knee: Secondary | ICD-10-CM

## 2012-01-06 DIAGNOSIS — M25569 Pain in unspecified knee: Secondary | ICD-10-CM

## 2012-01-06 DIAGNOSIS — IMO0002 Reserved for concepts with insufficient information to code with codable children: Secondary | ICD-10-CM

## 2012-01-06 MED ORDER — HYDROCODONE-ACETAMINOPHEN 10-325 MG PO TABS: 1.0000 | ORAL_TABLET | Freq: Four times a day (QID) | ORAL | Status: DC | PRN

## 2012-01-06 NOTE — Progress Notes (Signed)
Patient ID: Raelyn Number, female   DOB: 02/07/1930, 78 y.o.   MRN: [id number] Chief Complaint  Patient presents with  . Follow-up    Recheck, requests knee injection.    Knee  Injection Procedure Note  Pre-operative Diagnosis: left knee oa  Post-operative Diagnosis: same  Indications: pain  Anesthesia: ethyl chloride   Procedure Details   Verbal consent was obtained for the procedure. Time out was completed.The joint was prepped with alcohol, followed by  Ethyl chloride spray and A 20 gauge needle was inserted into the knee via lateral approach; 4ml 1% lidocaine and 1 ml of depomedrol  was then injected into the joint . The needle was removed and the area cleansed and dressed.  Complications:  None; patient tolerated the procedure well.  Knee  Injection Procedure Note  Pre-operative Diagnosis: right knee oa  Post-operative Diagnosis: same  Indications: pain  Anesthesia: ethyl chloride   Procedure Details   Verbal consent was obtained for the procedure. Time out was completed.The joint was prepped with alcohol, followed by  Ethyl chloride spray and A 20 gauge needle was inserted into the knee via lateral approach; 4ml 1% lidocaine and 1 ml of depomedrol  was then injected into the joint . The needle was removed and the area cleansed and dressed.  Complications:  None; patient tolerated the procedure well.

## 2012-01-06 NOTE — Patient Instructions (Addendum)
You have received a steroid shot. 15% of patients experience increased pain at the injection site with in the next 24 hours. This is best treated with ice and tylenol extra strength 2 tabs every 8 hours. If you are still having pain please call the office.    

## 2012-02-14 ENCOUNTER — Encounter (INDEPENDENT_AMBULATORY_CARE_PROVIDER_SITE_OTHER): Payer: Self-pay

## 2012-08-01 ENCOUNTER — Encounter: Payer: Self-pay | Admitting: Orthopedic Surgery

## 2012-08-01 ENCOUNTER — Ambulatory Visit (INDEPENDENT_AMBULATORY_CARE_PROVIDER_SITE_OTHER): Payer: Medicare Other | Admitting: Orthopedic Surgery

## 2012-08-01 VITALS — BP 130/78 | Ht 63.5 in | Wt 157.0 lb

## 2012-08-01 DIAGNOSIS — M2550 Pain in unspecified joint: Secondary | ICD-10-CM

## 2012-08-01 DIAGNOSIS — M1710 Unilateral primary osteoarthritis, unspecified knee: Secondary | ICD-10-CM

## 2012-08-01 HISTORY — DX: Pain in unspecified joint: M25.50

## 2012-08-01 MED ORDER — PREDNISONE 5 MG PO KIT: 5.0000 mg | PACK | ORAL | Status: DC

## 2012-08-01 NOTE — Progress Notes (Signed)
Patient ID: Raelyn Number, female   DOB: [birth year], 79 y.o.   MRN: [id number] Chief Complaint  Patient presents with  . Follow-up    follow up bilateral knees requst injections    Knee  Injection Procedure Note  Pre-operative Diagnosis: left knee oa  Post-operative Diagnosis: same  Indications: pain  Anesthesia: ethyl chloride   Procedure Details   Verbal consent was obtained for the procedure. Time out was completed.The joint was prepped with alcohol, followed by  Ethyl chloride spray and A 20 gauge needle was inserted into the knee via lateral approach; 4ml 1% lidocaine and 1 ml of depomedrol  was then injected into the joint . The needle was removed and the area cleansed and dressed.  Complications:  None; patient tolerated the procedure well. Knee  Injection Procedure Note  Pre-operative Diagnosis: right knee oa  Post-operative Diagnosis: same  Indications: pain  Anesthesia: ethyl chloride   Procedure Details   Verbal consent was obtained for the procedure. Time out was completed.The joint was prepped with alcohol, followed by  Ethyl chloride spray and A 20 gauge needle was inserted into the knee via lateral approach; 4ml 1% lidocaine and 1 ml of depomedrol  was then injected into the joint . The needle was removed and the area cleansed and dressed.  Complications:  None; patient tolerated the procedure well.

## 2012-08-01 NOTE — Addendum Note (Signed)
Addended by: Vickki Hearing on: 08/01/2012 09:43 AM   Modules accepted: Orders

## 2012-08-01 NOTE — Patient Instructions (Addendum)
You have received a steroid shot. 15% of patients experience increased pain at the injection site with in the next 24 hours. This is best treated with ice and tylenol extra strength 2 tabs every 8 hours. If you are still having pain please call the office.    

## 2012-10-09 ENCOUNTER — Other Ambulatory Visit (HOSPITAL_COMMUNITY): Payer: Self-pay | Admitting: Family Medicine

## 2012-10-09 ENCOUNTER — Ambulatory Visit (HOSPITAL_COMMUNITY)
Admission: RE | Admit: 2012-10-09 | Discharge: 2012-10-09 | Disposition: A | Discharge status: Home / Self Care | Payer: Medicare Other | Source: Ambulatory Visit | Attending: Family Medicine | Admitting: Family Medicine

## 2012-10-09 DIAGNOSIS — M19019 Primary osteoarthritis, unspecified shoulder: Secondary | ICD-10-CM

## 2012-10-09 DIAGNOSIS — M25519 Pain in unspecified shoulder: Secondary | ICD-10-CM | POA: Insufficient documentation

## 2012-10-26 ENCOUNTER — Ambulatory Visit (INDEPENDENT_AMBULATORY_CARE_PROVIDER_SITE_OTHER): Payer: Medicare Other | Admitting: Orthopedic Surgery

## 2012-10-26 ENCOUNTER — Encounter: Payer: Self-pay | Admitting: Orthopedic Surgery

## 2012-10-26 VITALS — BP 117/70 | Ht 63.5 in | Wt 157.0 lb

## 2012-10-26 DIAGNOSIS — M7030 Other bursitis of elbow, unspecified elbow: Secondary | ICD-10-CM | POA: Insufficient documentation

## 2012-10-26 DIAGNOSIS — M7550 Bursitis of unspecified shoulder: Secondary | ICD-10-CM

## 2012-10-26 DIAGNOSIS — M67919 Unspecified disorder of synovium and tendon, unspecified shoulder: Secondary | ICD-10-CM

## 2012-10-26 DIAGNOSIS — M7020 Olecranon bursitis, unspecified elbow: Secondary | ICD-10-CM

## 2012-10-26 DIAGNOSIS — M7022 Olecranon bursitis, left elbow: Secondary | ICD-10-CM

## 2012-10-26 DIAGNOSIS — M7552 Bursitis of left shoulder: Secondary | ICD-10-CM

## 2012-10-26 HISTORY — DX: Bursitis of unspecified shoulder: M75.50

## 2012-10-26 NOTE — Progress Notes (Signed)
Patient ID: Raelyn Number, female   DOB: Sep 17, 1929, 79 y.o.   MRN: [id number] Chief Complaint  Patient presents with  . Shoulder Pain    Left shoulder pain. Referred by DR. Phillips Odor  . Elbow Pain    Swelling and pain left elbow   Patient history: Left shoulder pain. Left arm pain. Left elbow pain and swelling. Pain started a few weeks ago No injury Sharp 10 out of 10 Constant Improved by Aleve  Review of Systems  Constitutional: Positive for weight loss.  Gastrointestinal: Positive for nausea.  Neurological:       Anxiety nervousness  Endo/Heme/Allergies: Bruises/bleeds easily.  Psychiatric/Behavioral: The patient is nervous/anxious.   All other systems reviewed and are negative.     BP 117/70  Ht 5' 3.5" (1.613 m)  Wt 157 lb (71.215 kg)  BMI 27.37 kg/m2 General appearance is normal, the patient is alert and oriented x3 with normal mood and affect. Body habitus is medium Ambulation is slow stride lengths are diminished there is no limp  Left upper extremity she has tenderness over the deltoid insertion in the peri-acromial region with decreased active range of motion normal painful passive range of motion with normal shoulder stability. Drop arm test was negative but on descent of the arm she had pain at about 90 of flexion which could indicate partial tearing of the rotator cuff which is expected in this age group. Overall strength and muscle tone were normal skin was intact there was a large bursal swelling and tenderness over the elbow but the elbow motion was normal biceps and triceps strength was normal she had good distal pulse no lymphadenopathy in the axilla or supraclavicular region sensation was normal good reflexes were noted  X-ray showed some a.c. joint arthrosis which is chronic  Encounter Diagnoses  Name Primary?  . Bursitis of elbow, left Yes  . Shoulder bursitis, left     Subacromial Shoulder Injection Procedure Note  Pre-operative Diagnosis: left RC  Syndrome  Post-operative Diagnosis: same  Indications: pain   Anesthesia: ethyl chloride   Procedure Details   Verbal consent was obtained for the procedure. The shoulder was prepped withalcohol and the skin was anesthetized. A 20 gauge needle was advanced into the subacromial space through posterior approach without difficulty  The space was then injected with 3 ml 1% lidocaine and 1 ml of depomedrol. The injection site was cleansed with isopropyl alcohol and a dressing was applied.  Complications:  None; patient tolerated the procedure well.  Second procedure note aspiration injection left elbow Left elbow olecranon bursitis Pain swelling loss of motion Anesthesia ethyl chloride  Procedure details After verbal consent and timeout the elbow was prepped with alcohol and anesthetized with ethyl chloride. We aspirated approximately 8 cc of clear yellow fluid and injected 2-1/2 cc of Depo-Medrol 40 and lidocaine 1% no complications were noted

## 2012-10-26 NOTE — Patient Instructions (Addendum)
You have received a steroid shot. 15% of patients experience increased pain at the injection site with in the next 24 hours. This is best treated with ice and tylenol extra strength 2 tabs every 8 hours. If you are still having pain please call the office.   BURSITIS SHOULDER AND ELBOW   TAKE STERAPRED DOSE PACK X 12 DAYS    Thank you for enrolling in MyChart. Please follow the instructions below to securely access your online medical record. MyChart allows you to send messages to your doctor, view your test results, manage appointments, and more.   How Do I Sign Up? 1. In your Internet browser, go to [employer] and enter [email or web address]. 2. Click on the Sign Up Now link in the Sign In box. You will see the New Member Sign Up page. 3. Enter your MyChart Access Code exactly as it appears below. You will not need to use this code after you've completed the sign-up process. If you do not sign up before the expiration date, you must request a new code. MyChart Access Code: [email or web address] Expires: 11/25/2012  9:08 AM  4. Enter your Social Security Number ([id number]) and Date of Birth (mm/dd/yyyy) as indicated and click Submit. You will be taken to the next sign-up page. 5. Create a MyChart ID. This will be your MyChart login ID and cannot be changed, so think of one that is secure and easy to remember. 6. Create a MyChart password. You can change your password at any time. 7. Enter your Password Reset Question and Answer. This can be used at a later time if you forget your password.  8. Enter your e-mail address. You will receive e-mail notification when new information is available in MyChart. 9. Click Sign Up. You can now view your medical record.   Additional Information Remember, MyChart is NOT to be used for urgent needs. For medical emergencies, dial 911.

## 2012-11-01 ENCOUNTER — Ambulatory Visit (HOSPITAL_COMMUNITY)
Admission: RE | Admit: 2012-11-01 | Discharge: 2012-11-01 | Disposition: A | Discharge status: Home / Self Care | Payer: Medicare Other | Source: Ambulatory Visit | Attending: Family Medicine | Admitting: Family Medicine

## 2012-11-01 ENCOUNTER — Other Ambulatory Visit (HOSPITAL_COMMUNITY): Payer: Self-pay | Admitting: Family Medicine

## 2012-11-01 DIAGNOSIS — R222 Localized swelling, mass and lump, trunk: Secondary | ICD-10-CM

## 2012-11-01 DIAGNOSIS — J069 Acute upper respiratory infection, unspecified: Secondary | ICD-10-CM

## 2012-11-01 MED ORDER — IOHEXOL 300 MG/ML  SOLN
80.0000 mL | Freq: Once | INTRAMUSCULAR | Status: AC | PRN
  Administered 2012-11-01: 80 mL via INTRAVENOUS

## 2012-11-07 ENCOUNTER — Institutional Professional Consult (permissible substitution) (INDEPENDENT_AMBULATORY_CARE_PROVIDER_SITE_OTHER): Payer: Medicare Other | Admitting: Thoracic Surgery (Cardiothoracic Vascular Surgery)

## 2012-11-07 ENCOUNTER — Encounter: Payer: Self-pay | Admitting: Thoracic Surgery (Cardiothoracic Vascular Surgery)

## 2012-11-07 ENCOUNTER — Other Ambulatory Visit: Payer: Self-pay

## 2012-11-07 VITALS — BP 116/72 | HR 100 | Resp 20 | Ht 63.5 in | Wt 150.0 lb

## 2012-11-07 DIAGNOSIS — D381 Neoplasm of uncertain behavior of trachea, bronchus and lung: Secondary | ICD-10-CM

## 2012-11-07 DIAGNOSIS — R918 Other nonspecific abnormal finding of lung field: Secondary | ICD-10-CM

## 2012-11-07 DIAGNOSIS — R222 Localized swelling, mass and lump, trunk: Secondary | ICD-10-CM

## 2012-11-07 NOTE — Progress Notes (Signed)
PCP is Kirk Ruths, MD Referring Provider is Corrie Mckusick, MD  Chief Complaint  Patient presents with  . Lung Mass    Surgical eval on right lung mass, Chest CT 11/01/12    HPI: 79 year old woman with a remote history of tobacco abuse who presents with a chief complaint of frequent cough.  Mrs. Monday is an 79 year old woman with a 60-pack-year history of smoking who quit 27 years ago. She was in her usual state of health until a couple of months ago. At that time she lost her appetite and had a frequent cough. The cough was productive and she thought it was due to a sinus infection. She has lost about 28 pounds over the past 2 months. She was seen and initially treated for possible bronchitis with antibiotics. When she failed to improve a chest x-ray was done, and it showed a 6 cm right hilar mass.  A CT of the chest then was done which confirmed a 4.6 x 4.3 x 5.8 cm mass centrally located in the right middle and upper lobes. There was no mediastinal adenopathy.  She is widowed and lives at home alone. She says that she sleeps a lot and doesn't have much energy. She does not get short of breath with routine activities such as light housework. She has a walk up 7 steps to get to her front door and does not had any difficulty with that. She cannot remember the last time she walked up a flight of stairs. She denies any prior history of cardiac disease. She denies asthma, COPD, emphysema. She says her cough has been productive of yellow and clear sputum. She denies any hemoptysis.  She was also noted to have some abnormality of her distal esophagus on the CT and has been referred to Dr. Leone Payor for evaluation of that.  Zubrod score is 1   Past Medical History  Diagnosis Date  . HTN (hypertension)   . Hyperlipidemia   . Thyroid disease   . Diabetes mellitus   . Arthritis   . Anxiety     Past Surgical History  Procedure Laterality Date  . Arm fracture surgery    . Tonsillectomy     . Appendectomy    . Abdominal hysterectomy    . Lipoma removal    . Eye surgery    . Cholecystectomy    . Cataract extraction      Family History  Problem Relation Age of Onset  . Heart disease    . Arthritis    . Cancer    . Diabetes      Social History History  Substance Use Topics  . Smoking status: Former Smoker    Types: Cigarettes    Start date: 03/29/1985  . Smokeless tobacco: Never Used  . Alcohol Use: No    Current Outpatient Prescriptions  Medication Sig Dispense Refill  . cholecalciferol (VITAMIN D) 1000 UNITS tablet Take 1,000 Units by mouth daily.      . diazepam (VALIUM) 5 MG tablet Take 5 mg by mouth every 6 (six) hours as needed for anxiety.      . donepezil (ARICEPT) 5 MG tablet Take 5 mg by mouth at bedtime as needed.      [name] esomeprazole (NEXIUM) 40 MG capsule Take 40 mg by mouth daily before breakfast.        . ezetimibe (ZETIA) 10 MG tablet Take 10 mg by mouth daily.        . ferrous sulfate 325 (65  FE) MG tablet Take 325 mg by mouth 2 (two) times daily.      . fluticasone (FLONASE) 50 MCG/ACT nasal spray Place 2 sprays into the nose daily.      [name] levothyroxine (SYNTHROID, LEVOTHROID) 88 MCG tablet Take 100 mcg by mouth daily.       . meclizine (ANTIVERT) 25 MG tablet Take 25 mg by mouth 3 (three) times daily as needed for dizziness.      . metFORMIN (GLUCOPHAGE) 500 MG tablet Take 500 mg by mouth 2 (two) times daily with a meal.        . Multiple Vitamin (MULTIVITAMIN) tablet Take 1 tablet by mouth daily.      [name] olmesartan-hydrochlorothiazide (BENICAR HCT) 20-12.5 MG per tablet Take 1 tablet by mouth daily.      . promethazine (PHENERGAN) 25 MG tablet Take 25 mg by mouth every 6 (six) hours as needed for nausea.      . rosuvastatin (CRESTOR) 5 MG tablet Take 5 mg by mouth daily.         No current facility-administered medications for this visit.    Allergies  Allergen Reactions  . Bactrim (Sulfamethoxazole W-Trimethoprim) Hives  . Ciprofloxacin  Hives  . Penicillins Swelling  . Welchol (Colesevelam Hcl) Other (See Comments)    Body aches  . Zocor (Simvastatin) Other (See Comments)    Muscle aches    Review of Systems  Constitutional: Positive for activity change, appetite change, fatigue and unexpected weight change (lost 28 pounds in 2 months).       Sleeps a lot  Respiratory: Positive for cough (frequent, productive/ no hemoptysis). Negative for chest tightness and wheezing.   Cardiovascular: Negative for chest pain.  Gastrointestinal:       Reflux  Musculoskeletal: Positive for joint swelling (left shoulder, left elbow) and arthralgias.  Neurological: Positive for dizziness.       Memory loss - on aricept  Hematological: Bruises/bleeds easily.  Psychiatric/Behavioral: The patient is nervous/anxious.        Anxiety and depression  All other systems reviewed and are negative.    BP 116/72  Pulse 100  Resp 20  Ht 5' 3.5" (1.613 m)  Wt 150 lb (68.04 kg)  BMI 26.15 kg/m2  SpO2 94% Physical Exam  Vitals reviewed. Constitutional: She is oriented to person, place, and time. She appears well-developed and well-nourished. No distress.  HENT:  Head: Normocephalic and atraumatic.  Eyes: EOM are normal. Pupils are equal, round, and reactive to light.  Neck: Neck supple. No thyromegaly present.  Cardiovascular: Normal rate, regular rhythm and normal heart sounds.  Exam reveals no gallop and no friction rub.   No murmur heard. Pulmonary/Chest: Effort normal and breath sounds normal. She has no wheezes. She has no rales.  Abdominal: Soft. There is no tenderness.  Musculoskeletal: She exhibits no edema.  Lymphadenopathy:    She has no cervical adenopathy.  Neurological: She is alert and oriented to person, place, and time. No cranial nerve deficit.  No focal motor abnormalities  Skin: Skin is warm and dry.     Diagnostic Tests: PA and lateral chest x-ray 11/01/12 *RADIOLOGY REPORT*  Clinical Data: Productive cough   CHEST - 2 VIEW  Comparison: September 23, 2009  Findings: There is a mass in the right middle lobe region measuring  6.5 x 6.8 x 5.0 cm. This mass is suspicious for neoplasm.  There is atelectatic change in the right middle lobe anteriorly.  Elsewhere, the lungs clear. There does  appear be some underlying  emphysema.  Heart size is normal. Pulmonary vascularity is normal. No  apparent adenopathy. Aorta is tortuous. There is degenerative  change in the thoracic spine.  IMPRESSION: Large right middle lobe mass suspicious for neoplasm.  Correlation with contrast enhanced chest CT advised to further  assess. Underlying emphysema.  Original Report Authenticated By: Bretta Bang, M.D.   CT of chest 11/01/12 *RADIOLOGY REPORT*  Clinical Data: Possible mass noted on recent chest x-ray.  CT CHEST WITH CONTRAST  Technique: Multidetector CT imaging of the chest was performed  following the standard protocol during bolus administration of  intravenous contrast.  Contrast: 80mL OMNIPAQUE IOHEXOL 300 MG/ML SOLN  Comparison: Chest x-ray 11/01/2012.  Findings:  Mediastinum: Heart size is normal. There is no significant  pericardial fluid, thickening or pericardial calcification. There  is atherosclerosis of the thoracic aorta, the great vessels of the  mediastinum and the coronary arteries, including calcified  atherosclerotic plaque in the left anterior descending and right  coronary arteries. No pathologically enlarged mediastinal or hilar  lymph nodes. Moderate sized hiatal hernia with some circumferential  thickening of the distal third of the esophagus.  Lungs/Pleura: In the anterior aspect of the right lung there is a  large mass which measures approximately 4.6 x 4.3 x 5.8 cm. This  mass involves the central portions of both the right middle and  upper lobes, and clearly violates the minor fissure (best  demonstrated on sagittal reconstructions). Extending anteriorly  from this mass there  is some nodular thickening of the minor  fissure. The portion of the mass within the right middle lobe  splays the bronchi extending to the medial and lateral segments of  the right middle lobe. It demonstrates heterogeneous peripheral  enhancement with central areas of lower attenuation, which may  represent some internal necrosis. No additional suspicious  appearing pulmonary nodules or masses are otherwise identified.  Small pleural-based calcification in the medial aspect of the left  lower lobe likely represents calcified granuloma. A small area of  scarring is noted in the medial aspect of the left lower lobe. No  acute consolidative airspace disease. No pleural effusions.  Upper Abdomen: Status post cholecystectomy. Multiple small  calcifications within the liver compatible with calcified  granulomas. A 1.6 cm well-defined low attenuation lesion in the  upper pole of the left kidney compatible with a simple cyst.  Atherosclerosis.  Musculoskeletal: There are no aggressive appearing lytic or blastic  lesions noted in the visualized portions of the skeleton.  IMPRESSION:  1. 4.6 x 4.3 x 5.8 cm mass in the right lung involving the central  portions of both the right middle and upper lobes. No associated  hilar or mediastinal adenopathy at this time. Based on the  aggressive appearance extending across the minor fissure, this is  compatible with a T4 lesion. Assuming no distal metastases (which  needs to be confirmed), this appears to represent T4, N0, M0  disease (i.e., this may represent Stage III-A disease). Given the  proximity of this lesion into the right middle lobe bronchi  (discussed above), this should be amendable to bronchoscopic guided  biopsy. Surgical consultation is recommended.  2. Atherosclerosis, including left main and three-vessel coronary  artery disease.  3. Moderate sized hiatal hernia with some mild circumferential  thickening of the distal third of the  esophagus. These findings  may simply reflect a reflux esophagitis, however if there is any  clinical concern for Barrett's metaplasia or esophageal neoplasia,  further evaluation with endoscopy should be considered.  4. Sequelae of old granulomatous disease, as above.  5. Status post cholecystectomy.  These results were called by telephone on 11/01/2012 at 11:20 a.m.  to Dr. Phillips Odor, who verbally acknowledged these results.  Original Report Authenticated By: Trudie Reed, M.D.  Impression: 79 year old former smoker who presents with a persistent cough and a 28 pound weight loss over a two-month period of time. She has been found to have a large right hilar mass. This almost certainly represents a new lung cancer. Has to be considered as such was can be proven otherwise. This lesion is very central and large it abuts the mediastinum as well as the PA. It would require a right upper and middle lobectomy at a minimum, but there is a very good chance that it would require pneumonectomy. I tried to give the patient and her daughter a realistic viewpoint on the magnitude of the surgery. She was resistant to consider any alternative. We did briefly discuss radiation and chemotherapy.  I had a long discussion with Mrs. Huntoon and her daughter. I made it clear that this would be a very big operation with significant risk in a patient her age. I think she is a moderately high risk candidate for a bilobectomy and extremely high risk candidate for a pneumonectomy.  We need a PET/CT to look for any evidence of metastatic disease.  She'll need pulmonary function testing with and without bronchodilators and a room air blood gas.  She'll need an MR of the brain to rule out metastatic disease.  She has no history of cardiac disease and no definite anginal symptoms. However she is somewhat limited in her physical activities and her CT showed left main and three-vessel disease. Therefore she needs cardiology  evaluation for clearance prior to considering surgical resection.  Once we have obtained the results of these tests and consultations, I will see her back to further discuss definitive diagnosis and treatment.  Plan: 1. PET/CT  2. MR brain  3. PFTs with room air blood gas  4. Cardiology consultation.  5. Return in 2 weeks to discuss results of tests

## 2012-11-09 ENCOUNTER — Encounter (HOSPITAL_COMMUNITY): Payer: Self-pay

## 2012-11-09 ENCOUNTER — Encounter (HOSPITAL_COMMUNITY): Payer: Medicare Other | Attending: Internal Medicine

## 2012-11-09 VITALS — BP 126/81 | HR 109 | Temp 97.9°F | Resp 18 | Ht 63.0 in | Wt 149.1 lb

## 2012-11-09 DIAGNOSIS — R222 Localized swelling, mass and lump, trunk: Secondary | ICD-10-CM | POA: Insufficient documentation

## 2012-11-09 DIAGNOSIS — R634 Abnormal weight loss: Secondary | ICD-10-CM

## 2012-11-09 DIAGNOSIS — R918 Other nonspecific abnormal finding of lung field: Secondary | ICD-10-CM

## 2012-11-09 DIAGNOSIS — N39498 Other specified urinary incontinence: Secondary | ICD-10-CM

## 2012-11-09 LAB — CBC
HCT: 38.1 % (ref 36.0–46.0)
MCV: 90.5 fL (ref 78.0–100.0)
Platelets: 306 10*3/uL (ref 150–400)
RBC: 4.21 MIL/uL (ref 3.87–5.11)
WBC: 13.3 10*3/uL — ABNORMAL HIGH (ref 4.0–10.5)

## 2012-11-09 LAB — APTT: aPTT: 30 seconds (ref 24–37)

## 2012-11-09 LAB — CREATININE, SERUM: GFR calc Af Amer: >90 mL/min (ref >90)

## 2012-11-09 LAB — PROTIME-INR
INR: 0.97 (ref 0.00–1.49)
Prothrombin Time: 12.7 seconds (ref 11.6–15.2)

## 2012-11-09 NOTE — Patient Instructions (Addendum)
[facility] Discharge Instructions  RECOMMENDATIONS MADE BY THE CONSULTANT AND ANY TEST RESULTS WILL BE SENT TO YOUR REFERRING PHYSICIAN.  EXAM FINDINGS BY THE PHYSICIAN TODAY AND SIGNS OR SYMPTOMS TO REPORT TO CLINIC OR PRIMARY PHYSICIAN: Exam and discussion by Dr. Neale Burly.  MEDICATIONS PRESCRIBED:  none  INSTRUCTIONS GIVEN AND DISCUSSED: Will see you back after seen by thoracic surgeon  SPECIAL INSTRUCTIONS/FOLLOW-UP: Follow-up after seen by surgeon.  Thank you for choosing [facility] to provide your oncology and hematology care.  To afford each patient quality time with our providers, please arrive at least 15 minutes before your scheduled appointment time.  With your help, our goal is to use those 15 minutes to complete the necessary work-up to ensure our physicians have the information they need to help with your evaluation and healthcare recommendations.    Effective January 1st, 2014, we ask that you re-schedule your appointment with our physicians should you arrive 10 or more minutes late for your appointment.  We strive to give you quality time with our providers, and arriving late affects you and other patients whose appointments are after yours.    Again, thank you for choosing [facility].  Our hope is that these requests will decrease the amount of time that you wait before being seen by our physicians.       _____________________________________________________________  Should you have questions after your visit to [facility], please contact our office at [phone] between the hours of 8:30 a.m. and 5:00 p.m.  Voicemails left after 4:30 p.m. will not be returned until the following business day.  For prescription refill requests, have your pharmacy contact our office with your prescription refill request.

## 2012-11-12 NOTE — Progress Notes (Signed)
[facility] - [facility]    MEDICAL ONCOLOGY - INITIAL CONSULATION  Referral MD  DR Phillips Odor  HAS ALSO SEEN DR Charlett Lango, THORACIC SURGERY  Reason for Referral: EVALUATION LUNG MASS  NO ACUTE COMPLAINT  HPI: patient seen in the office for initial evaluation, per the history is as patient who presented recently to primary care with a respiratory infection, had an abnormal chest x-ray, showing lung mass, followed by a CT chest with contrast on 11/01/12.  Patient has been referred to thoracic surgery as well as medical oncology.  The CT scan shows a 5.8 x 4.3 x 4.6 cm mass in the right lung involving the right middle and upper lobes has been interpreted as a T4 N0 NX tumor extends across the minor fissure. The patient is already seen thoracic oncology.  She is scheduled for pulmonary function tests and a PET scan and brain MRI.  She will has an appointment with cardiology on 8/25.  There is a history of a 20 pound weight loss with poor appetite.  Some cough nonproductive no mitosis.  No fevers chills or sweats. No chest pain. No abdominal pain.  She complained of diarrhea to nursing she mentioned to me occasional loose stools minimize any changes from chronic.  She has urine incontinence this is chronic.  No new bone pain    Past Medical History  Diagnosis Date  . HTN (hypertension)   . Hyperlipidemia   . Thyroid disease   . Diabetes mellitus   . Arthritis   . Anxiety   :  Past Surgical History  Procedure Laterality Date  . Arm fracture surgery    . Tonsillectomy    . Appendectomy    . Abdominal hysterectomy    . Lipoma removal    . Eye surgery    . Cholecystectomy    . Cataract extraction    :  Current Outpatient Prescriptions  Medication Sig Dispense Refill  . cholecalciferol (VITAMIN D) 1000 UNITS tablet Take 1,000 Units by mouth daily.      . diazepam (VALIUM) 5 MG tablet Take 5 mg by mouth every 6 (six) hours as needed for anxiety.      . donepezil (ARICEPT) 5  MG tablet Take 5 mg by mouth at bedtime as needed.      [name] esomeprazole (NEXIUM) 40 MG capsule Take 40 mg by mouth daily before breakfast.        . ezetimibe (ZETIA) 10 MG tablet Take 10 mg by mouth daily.        . ferrous sulfate 325 (65 FE) MG tablet Take 325 mg by mouth 2 (two) times daily.      . fluticasone (FLONASE) 50 MCG/ACT nasal spray Place 2 sprays into the nose daily.      [name] levothyroxine (SYNTHROID, LEVOTHROID) 88 MCG tablet Take 100 mcg by mouth daily.       . meclizine (ANTIVERT) 25 MG tablet Take 25 mg by mouth 3 (three) times daily as needed for dizziness.      . metFORMIN (GLUCOPHAGE) 500 MG tablet Take 500 mg by mouth 2 (two) times daily with a meal.        . Multiple Vitamin (MULTIVITAMIN) tablet Take 1 tablet by mouth daily.      [name] olmesartan-hydrochlorothiazide (BENICAR HCT) 20-12.5 MG per tablet Take 1 tablet by mouth daily.      . promethazine (PHENERGAN) 25 MG tablet Take 25 mg by mouth every 6 (six) hours as needed for nausea.      [name]  rosuvastatin (CRESTOR) 5 MG tablet Take 5 mg by mouth daily.         No current facility-administered medications for this visit.     Allergies  Allergen Reactions  . Bactrim [Sulfamethoxazole W-Trimethoprim] Hives  . Ciprofloxacin Hives  . Penicillins Swelling  . Welchol [Colesevelam Hcl] Other (See Comments)    Body aches  . Zocor [Simvastatin] Other (See Comments)    Muscle aches  :  Family History  Problem Relation Age of Onset  . Heart disease    . Arthritis    . Cancer    . Diabetes    :  History   Social History  . Marital Status: Widowed    Spouse Name: N/A    Number of Children: 3  . Years of Education: 11   Occupational History  . retired    Social History Main Topics  . Smoking status: Former Smoker    Types: Cigarettes    Start date: 03/29/1985  . Smokeless tobacco: Never Used  . Alcohol Use: No  . Drug Use: No  . Sexual Activity: Not on file   Other Topics Concern  . Not on file   Social  History Narrative  . No narrative on file  :  Resolving cough.  GI and GU symptoms as in the current illness.  Patient is anxious over the results of and this.  Otherwise the rest of 10 point review of systems except for a 20 pound weight loss and poor appetite as already noted, is negative  Exam: @IPVITALS @  General:   in no acute distress.  Eyes:  no scleral icterus.  ENT: no thrush.    Lymphatics:  Negative cervical, supraclavicular or axillary adenopathy.  Respiratory: lungs were clear bilaterally without wheezing or crackles.  Cardiovascular:  Regular rate and rhythm, .  There was no pedal edema.  GI:  abdomen was soft, flat, nontender, nondistended, without organomegaly.  Muscoloskeletal:  no spinal tenderness of palpation of vertebral spine.  Skin exam was without echymosis, petichae.  Neuro exam was nonfocal.    Patient was alerted and oriented.  Attention was good.   Language was appropriate.  Mood was normal without depression, but anxious.  Speech was not pressured.  Thought content some Tangential , some memory lapses.     Lab Results  Component Value Date   WBC 13.3* 11/09/2012   HGB 12.5 11/09/2012   HCT 38.1 11/09/2012   PLT 306 11/09/2012   GLUCOSE 104* 03/03/2010   NA 140 03/03/2010   K 4.3 03/03/2010   CL 101 03/03/2010   CREATININE 0.50 11/09/2012   BUN 25* 03/03/2010   CO2 31 03/03/2010   INR 0.97 11/09/2012    Dg Chest 2 View  11/01/2012   *RADIOLOGY REPORT*  Clinical Data: Productive cough  CHEST - 2 VIEW  Comparison:  September 23, 2009  Findings: There is a mass in the right middle lobe region measuring 6.5 x 6.8 x 5.0 cm.  This mass is suspicious for neoplasm.  There is atelectatic change in the right middle lobe anteriorly.  Elsewhere, the lungs clear.  There does appear be some underlying emphysema.  Heart size is normal.  Pulmonary vascularity is normal.  No apparent adenopathy.  Aorta is tortuous.  There is degenerative change in the thoracic spine.  IMPRESSION: Large right  middle lobe mass suspicious for neoplasm. Correlation with contrast enhanced chest CT advised to further assess.  Underlying emphysema.   Original Report Authenticated By: Bretta Bang,  M.D.   Ct Chest W Contrast  11/01/2012   *RADIOLOGY REPORT*  Clinical Data: Possible mass noted on recent chest x-ray.  CT CHEST WITH CONTRAST  Technique:  Multidetector CT imaging of the chest was performed following the standard protocol during bolus administration of intravenous contrast.  Contrast: 80mL OMNIPAQUE IOHEXOL 300 MG/ML  SOLN  Comparison: Chest x-ray 11/01/2012.  Findings:  Mediastinum: Heart size is normal. There is no significant pericardial fluid, thickening or pericardial calcification. There is atherosclerosis of the thoracic aorta, the great vessels of the mediastinum and the coronary arteries, including calcified atherosclerotic plaque in the left anterior descending and right coronary arteries. No pathologically enlarged mediastinal or hilar lymph nodes. Moderate sized hiatal hernia with some circumferential thickening of the distal third of the esophagus.  Lungs/Pleura: In the anterior aspect of the right lung there is a large mass which measures approximately 4.6 x 4.3 x 5.8 cm.  This mass involves the central portions of both the right middle and upper lobes, and clearly violates the minor fissure (best demonstrated on sagittal reconstructions).  Extending anteriorly from this mass there is some nodular thickening of the minor fissure.  The portion of the mass within the right middle lobe splays the bronchi extending to the medial and lateral segments of the right middle lobe. It demonstrates heterogeneous peripheral enhancement with central areas of lower attenuation, which may represent some internal necrosis.  No additional suspicious appearing pulmonary nodules or masses are otherwise identified. Small pleural-based calcification in the medial aspect of the left lower lobe likely represents  calcified granuloma.  A small area of scarring is noted in the medial aspect of the left lower lobe.  No acute consolidative airspace disease.  No pleural effusions.  Upper Abdomen: Status post cholecystectomy.  Multiple small calcifications within the liver compatible with calcified granulomas.  A 1.6 cm well-defined low attenuation lesion in the upper pole of the left kidney compatible with a simple cyst. Atherosclerosis.  Musculoskeletal: There are no aggressive appearing lytic or blastic lesions noted in the visualized portions of the skeleton.  IMPRESSION: 1. 4.6 x 4.3 x 5.8 cm mass in the right lung involving the central portions of both the right middle and upper lobes.  No associated hilar or mediastinal adenopathy at this time.  Based on the aggressive appearance extending across the minor fissure, this is compatible with a T4 lesion.  Assuming no distal metastases (which needs to be confirmed), this appears to represent T4, N0, M0 disease (i.e., this may represent Stage III-A disease). Given the proximity of this lesion into the right middle lobe bronchi (discussed above), this should be amendable to bronchoscopic guided biopsy.  Surgical consultation is recommended. 2. Atherosclerosis, including left main and three-vessel coronary artery disease. 3.  Moderate sized hiatal hernia with some mild circumferential thickening of the distal third of the esophagus.  These findings may simply reflect a reflux esophagitis, however if there is any clinical concern for Barrett's metaplasia or esophageal neoplasia, further evaluation with endoscopy should be considered. 4.  Sequelae of old granulomatous disease, as above. 5.  Status post cholecystectomy.  These results were called by telephone on 11/01/2012 at 11:20 a.m. to Dr. Phillips Odor, who verbally acknowledged these results.   Original Report Authenticated By: Trudie Reed, M.D.    Pathology:none  Dg Chest 2 View  11/01/2012   *RADIOLOGY REPORT*  Clinical  Data: Productive cough  CHEST - 2 VIEW  Comparison:  September 23, 2009  Findings: There is a mass  in the right middle lobe region measuring 6.5 x 6.8 x 5.0 cm.  This mass is suspicious for neoplasm.  There is atelectatic change in the right middle lobe anteriorly.  Elsewhere, the lungs clear.  There does appear be some underlying emphysema.  Heart size is normal.  Pulmonary vascularity is normal.  No apparent adenopathy.  Aorta is tortuous.  There is degenerative change in the thoracic spine.  IMPRESSION: Large right middle lobe mass suspicious for neoplasm. Correlation with contrast enhanced chest CT advised to further assess.  Underlying emphysema.   Original Report Authenticated By: Bretta Bang, M.D.   Ct Chest W Contrast  11/01/2012   *RADIOLOGY REPORT*  Clinical Data: Possible mass noted on recent chest x-ray.  CT CHEST WITH CONTRAST  Technique:  Multidetector CT imaging of the chest was performed following the standard protocol during bolus administration of intravenous contrast.  Contrast: 80mL OMNIPAQUE IOHEXOL 300 MG/ML  SOLN  Comparison: Chest x-ray 11/01/2012.  Findings:  Mediastinum: Heart size is normal. There is no significant pericardial fluid, thickening or pericardial calcification. There is atherosclerosis of the thoracic aorta, the great vessels of the mediastinum and the coronary arteries, including calcified atherosclerotic plaque in the left anterior descending and right coronary arteries. No pathologically enlarged mediastinal or hilar lymph nodes. Moderate sized hiatal hernia with some circumferential thickening of the distal third of the esophagus.  Lungs/Pleura: In the anterior aspect of the right lung there is a large mass which measures approximately 4.6 x 4.3 x 5.8 cm.  This mass involves the central portions of both the right middle and upper lobes, and clearly violates the minor fissure (best demonstrated on sagittal reconstructions).  Extending anteriorly from this mass there is  some nodular thickening of the minor fissure.  The portion of the mass within the right middle lobe splays the bronchi extending to the medial and lateral segments of the right middle lobe. It demonstrates heterogeneous peripheral enhancement with central areas of lower attenuation, which may represent some internal necrosis.  No additional suspicious appearing pulmonary nodules or masses are otherwise identified. Small pleural-based calcification in the medial aspect of the left lower lobe likely represents calcified granuloma.  A small area of scarring is noted in the medial aspect of the left lower lobe.  No acute consolidative airspace disease.  No pleural effusions.  Upper Abdomen: Status post cholecystectomy.  Multiple small calcifications within the liver compatible with calcified granulomas.  A 1.6 cm well-defined low attenuation lesion in the upper pole of the left kidney compatible with a simple cyst. Atherosclerosis.  Musculoskeletal: There are no aggressive appearing lytic or blastic lesions noted in the visualized portions of the skeleton.  IMPRESSION: 1. 4.6 x 4.3 x 5.8 cm mass in the right lung involving the central portions of both the right middle and upper lobes.  No associated hilar or mediastinal adenopathy at this time.  Based on the aggressive appearance extending across the minor fissure, this is compatible with a T4 lesion.  Assuming no distal metastases (which needs to be confirmed), this appears to represent T4, N0, M0 disease (i.e., this may represent Stage III-A disease). Given the proximity of this lesion into the right middle lobe bronchi (discussed above), this should be amendable to bronchoscopic guided biopsy.  Surgical consultation is recommended. 2. Atherosclerosis, including left main and three-vessel coronary artery disease. 3.  Moderate sized hiatal hernia with some mild circumferential thickening of the distal third of the esophagus.  These findings may simply reflect a reflux  esophagitis, however if there is any clinical concern for Barrett's metaplasia or esophageal neoplasia, further evaluation with endoscopy should be considered. 4.  Sequelae of old granulomatous disease, as above. 5.  Status post cholecystectomy.  These results were called by telephone on 11/01/2012 at 11:20 a.m. to Dr. Phillips Odor, who verbally acknowledged these results.   Original Report Authenticated By: Trudie Reed, M.D.    Assessment and Plan:  Large lung mass crossing a fissure so T4 lesion, on CT no evidence adenopathy or any obvious metastatic disease but staging is incomplete.  Some cognitive impairment but consistent with age and according to family at baseline.  CBC creatinine unremarkable liver functions have been documented coagulation studies need to be document.  Patient currently appears to be a potential surgical candidate.  She is already seen thoracic surgery an MRI brain, PET scan for staging, cardiology evaluation for surgical clearance, already scheduled.  Patient expressed some interest in a 2nd opinion  Plan.. Go ahead and check liver function test PT and PTT.  Keep your scheduled appointments.  Will followup after evaluation on 8/26.  Weight for to certify results and then talk the patient's family with our recommendations about whether or not she wants  2nd opinion at another center. If patient is ultimate surgical candidate then certainly recommend proceeding without a biopsy, otherwise bronchoscopy would be done for tissue prior to evaluation for chemotherapy and radiation.  Potential following surgery, possible postop radiation might be needed depending on findings, the patient would appear to be possible candidate for postoperative chemotherapy if she goes on to successful surgery of a stage III tumor with potential cure this would have to be tempered by her age and postop performance status.  The next that this did bring all information together on 8/26 visit  Marin Roberts,  MD 11/12/2012,11:41 AM

## 2012-11-15 ENCOUNTER — Telehealth: Payer: Self-pay | Admitting: Orthopedic Surgery

## 2012-11-15 NOTE — Telephone Encounter (Signed)
Patient called to cancel her upcoming apointment (4 wk fu - Lt shoulder & elbow bursitis) due to other medical issues and appointments; states her shoulder is doing well.  She will call back to re-schedule.

## 2012-11-16 ENCOUNTER — Encounter (HOSPITAL_COMMUNITY): Payer: Self-pay

## 2012-11-16 ENCOUNTER — Ambulatory Visit (HOSPITAL_COMMUNITY)
Admission: RE | Admit: 2012-11-16 | Discharge: 2012-11-16 | Disposition: A | Discharge status: Home / Self Care | Payer: Medicare Other | Source: Ambulatory Visit | Attending: Thoracic Surgery (Cardiothoracic Vascular Surgery) | Admitting: Thoracic Surgery (Cardiothoracic Vascular Surgery)

## 2012-11-16 DIAGNOSIS — R222 Localized swelling, mass and lump, trunk: Secondary | ICD-10-CM | POA: Insufficient documentation

## 2012-11-16 DIAGNOSIS — I700 Atherosclerosis of aorta: Secondary | ICD-10-CM | POA: Insufficient documentation

## 2012-11-16 DIAGNOSIS — D381 Neoplasm of uncertain behavior of trachea, bronchus and lung: Secondary | ICD-10-CM

## 2012-11-16 DIAGNOSIS — R51 Headache: Secondary | ICD-10-CM | POA: Insufficient documentation

## 2012-11-16 DIAGNOSIS — R42 Dizziness and giddiness: Secondary | ICD-10-CM | POA: Insufficient documentation

## 2012-11-16 DIAGNOSIS — I10 Essential (primary) hypertension: Secondary | ICD-10-CM | POA: Insufficient documentation

## 2012-11-16 DIAGNOSIS — I517 Cardiomegaly: Secondary | ICD-10-CM | POA: Insufficient documentation

## 2012-11-16 LAB — PULMONARY FUNCTION TEST

## 2012-11-16 MED ORDER — ALBUTEROL SULFATE (5 MG/ML) 0.5% IN NEBU
2.5000 mg | INHALATION_SOLUTION | Freq: Once | RESPIRATORY_TRACT | Status: AC
  Administered 2012-11-16: 2.5 mg via RESPIRATORY_TRACT

## 2012-11-16 MED ORDER — GADOBENATE DIMEGLUMINE 529 MG/ML IV SOLN
15.0000 mL | Freq: Once | INTRAVENOUS | Status: AC | PRN
  Administered 2012-11-16: 14 mL via INTRAVENOUS

## 2012-11-16 MED ORDER — FLUDEOXYGLUCOSE F - 18 (FDG) INJECTION
18.0000 | Freq: Once | INTRAVENOUS | Status: AC | PRN
  Administered 2012-11-16: 18 via INTRAVENOUS

## 2012-11-20 LAB — GLUCOSE, CAPILLARY: Glucose-Capillary: 123 mg/dL — ABNORMAL HIGH (ref 70–99)

## 2012-11-21 ENCOUNTER — Ambulatory Visit: Payer: Medicare Other | Admitting: Thoracic Surgery (Cardiothoracic Vascular Surgery)

## 2012-11-22 ENCOUNTER — Ambulatory Visit (HOSPITAL_COMMUNITY): Payer: Medicare Other

## 2012-11-27 DIAGNOSIS — Z9289 Personal history of other medical treatment: Secondary | ICD-10-CM

## 2012-11-27 HISTORY — DX: Personal history of other medical treatment: Z92.89

## 2012-11-30 ENCOUNTER — Ambulatory Visit: Payer: Medicare Other | Admitting: Orthopedic Surgery

## 2012-12-12 ENCOUNTER — Encounter: Payer: Self-pay | Admitting: Thoracic Surgery (Cardiothoracic Vascular Surgery)

## 2012-12-12 ENCOUNTER — Ambulatory Visit (INDEPENDENT_AMBULATORY_CARE_PROVIDER_SITE_OTHER): Payer: Medicare Other | Admitting: Thoracic Surgery (Cardiothoracic Vascular Surgery)

## 2012-12-12 VITALS — BP 131/79 | HR 104 | Resp 20 | Ht 63.0 in | Wt 146.0 lb

## 2012-12-12 DIAGNOSIS — R222 Localized swelling, mass and lump, trunk: Secondary | ICD-10-CM

## 2012-12-12 DIAGNOSIS — R918 Other nonspecific abnormal finding of lung field: Secondary | ICD-10-CM

## 2012-12-12 NOTE — Progress Notes (Signed)
HPI:  Patient returns for routine postoperative follow-up having undergone  The patient's early postoperative recovery while in the hospital was notable for Since hospital discharge the patient reports  Past Medical History  Diagnosis Date  . HTN (hypertension)   . Hyperlipidemia   . Thyroid disease   . Diabetes mellitus   . Arthritis   . Anxiety     Current Outpatient Prescriptions  Medication Sig Dispense Refill  . cholecalciferol (VITAMIN D) 1000 UNITS tablet Take 1,000 Units by mouth daily.      . diazepam (VALIUM) 5 MG tablet Take 5 mg by mouth every 6 (six) hours as needed for anxiety.      . donepezil (ARICEPT) 5 MG tablet Take 5 mg by mouth at bedtime as needed.      . esomeprazole (NEXIUM) 40 MG capsule Take 40 mg by mouth daily before breakfast.        . ezetimibe (ZETIA) 10 MG tablet Take 10 mg by mouth daily.        . ferrous sulfate 325 (65 FE) MG tablet Take 325 mg by mouth 2 (two) times daily.      . fluticasone (FLONASE) 50 MCG/ACT nasal spray Place 2 sprays into the nose daily.      . levothyroxine (SYNTHROID, LEVOTHROID) 88 MCG tablet Take 100 mcg by mouth daily.       . meclizine (ANTIVERT) 25 MG tablet Take 25 mg by mouth 3 (three) times daily as needed for dizziness.      . metFORMIN (GLUCOPHAGE) 500 MG tablet Take 500 mg by mouth 2 (two) times daily with a meal.        . Multiple Vitamin (MULTIVITAMIN) tablet Take 1 tablet by mouth daily.      . olmesartan-hydrochlorothiazide (BENICAR HCT) 20-12.5 MG per tablet Take 1 tablet by mouth daily.      . promethazine (PHENERGAN) 25 MG tablet Take 25 mg by mouth every 6 (six) hours as needed for nausea.      . rosuvastatin (CRESTOR) 5 MG tablet Take 5 mg by mouth daily.         No current facility-administered medications for this visit.   Allergies  Allergen Reactions  . Bactrim [Sulfamethoxazole W-Trimethoprim] Hives  . Ciprofloxacin Hives  . Penicillins Swelling  . Welchol [Colesevelam Hcl] Other (See  Comments)    Body aches  . Zocor [Simvastatin] Other (See Comments)    Muscle aches     Physical Exam BP 131/79  Pulse 104  Resp 20  Ht 5' 3" (1.6 m)  Wt 146 lb (66.225 kg)  BMI 25.87 kg/m2  SpO2 93% 79-year-old woman in no acute distress Neurologic alert and oriented x3 with no focal deficits No cervical or subclavicular adenopathy Lungs diminished breath sounds bilaterally no wheezing Cardiac regular rate and rhythm normal S1 and S2, no rub or murmur Abdomen soft nontender Extremities without clubbing cyanosis or edema  Diagnostic Tests: PET/CT Clinical Data: Initial treatment strategy for lung mass.  NUCLEAR MEDICINE PET SKULL BASE TO THIGH  Fasting Blood Glucose: 123  Technique: 18 mCi F-18 FDG was injected intravenously. CT data was  obtained and used for attenuation correction and anatomic  localization only. (This was not acquired as a diagnostic CT  examination.) Additional exam technical data entered on  technologist worksheet.  Comparison: CT chest 11/01/2012  Findings:  Neck: No hypermetabolic pulmonary nodule or mass noted. No  hypermetabolic adenopathy.  Chest: The right upper lobe paramediastinal mass abutting the  right   hilum is again noted. This measures 4.2 x 5.0 x 4.7 cm. The  SUV max associated this mass is equal to 20, image 76. There is  mild postobstructive atelectasis involving the anterior right upper  lobe. No significant FDG uptake is associated with the mediastinal  or hilar lymph nodes. Prominent low right paratracheal lymph node  measures 1.1 cm but does not exhibit any significant FDG uptake.  There is no uptake in the subcarinal region. The heart size is  moderately enlarged. There is no pericardial effusion. No pleural  effusion noted.  Abdomen/Pelvis: No abnormal hypermetabolic activity within the  liver, pancreas, adrenal glands, or spleen. No hypermetabolic  lymph nodes in the abdomen or pelvis. Nonobstructing calculus is  noted  within the upper pole of the left kidney. There is a cyst  within the right adnexal region which measures 3.8 cm. Advanced  calcified atherosclerotic disease affects the abdominal aorta and  its branches. No aneurysm noted.  Skeleton: No focal hypermetabolic activity to suggest skeletal  metastasis.  IMPRESSION:  1. The right upper lobe lung mass exhibits intense FDG uptake and  is compatible with primary lung neoplasm. There is associated mild  postobstructive atelectasis involving the anterior right upper  lobe.  2. No evidence for lymph node metastasis or distant metastatic  disease.  Original Report Authenticated By: Peggy Espinoza, M.D.  MR brain CLINICAL DATA: 79-year-old female with recently diagnosed lung  malignancy. Headaches, dizziness. History of hypertension.  EXAM:  MRI HEAD WITHOUT AND WITH CONTRAST  TECHNIQUE:  Multiplanar, multiecho pulse sequences of the brain and surrounding  structures were obtained according to standard protocol without and  with intravenous contrast  CONTRAST: 14mL MULTIHANCE GADOBENATE DIMEGLUMINE 529 MG/ML IV SOLN  COMPARISON: PET-CT 11/16/2012. Paranasal sinus CT 07/01/2004.  FINDINGS:  No midline shift, mass effect, or evidence of intracranial mass  lesion. No abnormal enhancement identified.  No restricted diffusion to suggest acute infarction. No  ventriculomegaly, extra-axial collection or acute intracranial  hemorrhage. Cervicomedullary junction and pituitary are within  normal limits. Negative visualized cervical spinal cord.  Major intracranial vascular flow voids are preserved. Scattered mild  for age cerebral white matter nonspecific T2 and FLAIR  hyperintensity. No cortical encephalomalacia. Deep gray matter  nuclei, brainstem, and cerebellum within normal limits for age.  Grossly normal visualized internal auditory structures.  Normal bone marrow signal. Negative orbits soft tissues. Minor  paranasal sinus mucosal thickening.  Mastoids are clear. Visualized  scalp soft tissues are within normal limits.  IMPRESSION:  No acute or metastatic intracranial abnormality. Mild for age  nonspecific white matter signal changes in the brain.  Electronically Signed  By: Peggy Espinoza  Nuclear stress Normal with Diaphragm attenuation  Pulmonary function tests FVC 1.39 (61%) FEV1 1.07 (64%) FEV1 to FVC ratio 77% TLC 3.2 L (68%) DLCO 46% predicted  Impression: Mrs. Monday is an 79-year-old gentleman history of tobacco abuse with a new right lung mass involving the upper and middle lobes. This is a relatively central mass. We do not yet have a diagnosis. That almost certainly is a new primary bronchogenic carcinoma. It is possibly resectable although it does abut the pulmonary artery to the lower lobe. There is no evidence of regional or distant metastases.  Her stress test showed no high risk features.  My main concern with surgery is her pulmonary reserve. Her predicted postresection FEV1 would be 0.8. Her diffusion capacity is already impaired and would be further compromise as well. I had a long discussion with   Mrs. Monday and her family regarding PFT results. They understand that she is absolutely on the borderline in terms of tolerating a resection. It really is impossible to predict whether she will have an acceptable lifestyle for pulmonary reserve standpoint after resection. It is clear that she would not tolerate a pneumonectomy, which this lesion might require.  I think at this point it would be reasonable to go ahead and do a biopsy so that we know exactly what we are dealing with. I do think that will help them with decision making.  The did have questions about treating with chemoradiation. I explained to them that this would be the alternative to surgery. There is less up front morbidity and mortality but less chance of cure with that approach as well.  I discussed with them the nature of bronchoscopy for biopsy.  They understand this is diagnostic inaccuracy. We discussed the indications, risks, benefits, and alternatives. They understand that there is risk associated with the anesthesia/sedation. They understand there is a risk of bleeding, pneumothorax, failure to make a diagnosis, and laryngospasm associated with the bronchoscopy. She understands the risks and wishes to proceed   Plan: Bronchoscopy for biopsy on Friday, September 19.  

## 2012-12-13 ENCOUNTER — Encounter (HOSPITAL_COMMUNITY): Payer: Self-pay

## 2012-12-13 ENCOUNTER — Ambulatory Visit (HOSPITAL_COMMUNITY): Payer: Medicare Other | Admitting: Oncology

## 2012-12-13 ENCOUNTER — Other Ambulatory Visit: Payer: Self-pay | Admitting: *Deleted

## 2012-12-13 DIAGNOSIS — R918 Other nonspecific abnormal finding of lung field: Secondary | ICD-10-CM

## 2012-12-13 NOTE — Pre-Procedure Instructions (Signed)
Peggy Espinoza  12/13/2012   Your procedure is scheduled on:  Fri, Sept 19 @ 7:30 AM  Report to [facility] at 5:30 AM.  Call this number if you have problems the morning of surgery: [phone]   Remember:   Do not eat food or drink liquids after midnight. On Thursday   Take these medicines the morning of surgery with A SIP OF WATER: Diazepam(Valium-if needed),Esomeprazole(Nexium),Flonase(Fluticasone-if needed),Synthroid(Levothyroxine),and Phenergan(Promethazine-if needed)              Stop taking your Aleve.No Aspirin,Goody's,BC's,Ibuprofen,Fish Oil,or any Herbal Medications   Do not wear jewelry, make-up or nail polish.  Do not wear lotions, powders, or perfumes. You may wear deodorant.  Do not shave 48 hours prior to surgery.   Do not bring valuables to the hospital.  [facility] is not responsible                   for any belongings or valuables.  Contacts, dentures or bridgework may not be worn into surgery.  Leave suitcase in the car. After surgery it may be brought to your room.  For patients admitted to the hospital, checkout time is 11:00 AM the day of  discharge.   Patients discharged the day of surgery will not be allowed to drive  home.    Special Instructions: Shower using CHG 2 nights before surgery and the night before surgery.  If you shower the day of surgery use CHG.  Use special wash - you have one bottle of CHG for all showers.  You should use approximately 1/3 of the bottle for each shower.   Please read over the following fact sheets that you were given: Pain Booklet, Coughing and Deep Breathing and Surgical Site Infection Prevention

## 2012-12-14 ENCOUNTER — Encounter (HOSPITAL_COMMUNITY): Payer: Self-pay

## 2012-12-14 ENCOUNTER — Ambulatory Visit (HOSPITAL_COMMUNITY)
Admission: RE | Admit: 2012-12-14 | Discharge: 2012-12-14 | Disposition: A | Discharge status: Home / Self Care | Payer: Medicare Other | Source: Ambulatory Visit | Attending: Thoracic Surgery (Cardiothoracic Vascular Surgery) | Admitting: Thoracic Surgery (Cardiothoracic Vascular Surgery)

## 2012-12-14 ENCOUNTER — Encounter (HOSPITAL_COMMUNITY)
Admission: RE | Admit: 2012-12-14 | Discharge: 2012-12-14 | Disposition: A | Discharge status: Home / Self Care | Payer: Medicare Other | Source: Ambulatory Visit | Attending: Thoracic Surgery (Cardiothoracic Vascular Surgery) | Admitting: Thoracic Surgery (Cardiothoracic Vascular Surgery)

## 2012-12-14 VITALS — BP 108/72 | HR 105 | Temp 98.2°F | Resp 20 | Ht 60.5 in | Wt 152.0 lb

## 2012-12-14 DIAGNOSIS — R918 Other nonspecific abnormal finding of lung field: Secondary | ICD-10-CM

## 2012-12-14 DIAGNOSIS — R222 Localized swelling, mass and lump, trunk: Secondary | ICD-10-CM | POA: Insufficient documentation

## 2012-12-14 HISTORY — DX: Personal history of other medical treatment: Z92.89

## 2012-12-14 HISTORY — DX: Unspecified dementia, unspecified severity, without behavioral disturbance, psychotic disturbance, mood disturbance, and anxiety: F03.90

## 2012-12-14 HISTORY — DX: Gastro-esophageal reflux disease without esophagitis: K21.9

## 2012-12-14 HISTORY — DX: Unspecified urinary incontinence: R32

## 2012-12-14 HISTORY — DX: Hypothyroidism, unspecified: E03.9

## 2012-12-14 LAB — COMPREHENSIVE METABOLIC PANEL
AST: 17 U/L (ref 0–37)
BUN: 16 mg/dL (ref 6–23)
CO2: 30 mEq/L (ref 19–32)
Calcium: 9.9 mg/dL (ref 8.4–10.5)
Chloride: 96 mEq/L (ref 96–112)
Creatinine, Ser: 0.6 mg/dL (ref 0.50–1.10)
GFR calc Af Amer: >90 mL/min (ref >90)
GFR calc non Af Amer: 83 mL/min — ABNORMAL LOW (ref >90)
Glucose, Bld: 166 mg/dL — ABNORMAL HIGH (ref 70–99)
Total Bilirubin: 0.3 mg/dL (ref 0.3–1.2)

## 2012-12-14 LAB — PROTIME-INR: INR: 1.02 (ref 0.00–1.49)

## 2012-12-14 LAB — CBC
HCT: 35.5 % — ABNORMAL LOW (ref 36.0–46.0)
Hemoglobin: 11.6 g/dL — ABNORMAL LOW (ref 12.0–15.0)
MCH: 29.2 pg (ref 26.0–34.0)
MCV: 89.4 fL (ref 78.0–100.0)
Platelets: 320 10*3/uL (ref 150–400)
RBC: 3.97 MIL/uL (ref 3.87–5.11)
WBC: 8.7 10*3/uL (ref 4.0–10.5)

## 2012-12-14 NOTE — Progress Notes (Signed)
Call to [facility] in MR at [facility]. Asked for ekg, cardiac study results & OV note if there is one. Left this msg. On voicemail.

## 2012-12-15 ENCOUNTER — Encounter (HOSPITAL_COMMUNITY): Payer: Self-pay | Admitting: Surgery

## 2012-12-15 ENCOUNTER — Encounter (HOSPITAL_COMMUNITY)
Admission: RE | Disposition: A | Discharge status: Home / Self Care | Payer: Self-pay | Source: Ambulatory Visit | Attending: Thoracic Surgery (Cardiothoracic Vascular Surgery)

## 2012-12-15 ENCOUNTER — Encounter (HOSPITAL_COMMUNITY): Payer: Self-pay | Admitting: Anesthesiology

## 2012-12-15 ENCOUNTER — Ambulatory Visit (HOSPITAL_COMMUNITY): Payer: Medicare Other | Admitting: Anesthesiology

## 2012-12-15 ENCOUNTER — Ambulatory Visit (HOSPITAL_COMMUNITY)
Admission: RE | Admit: 2012-12-15 | Discharge: 2012-12-15 | Disposition: A | Discharge status: Home / Self Care | Payer: Medicare Other | Source: Ambulatory Visit | Attending: Thoracic Surgery (Cardiothoracic Vascular Surgery) | Admitting: Thoracic Surgery (Cardiothoracic Vascular Surgery)

## 2012-12-15 DIAGNOSIS — I10 Essential (primary) hypertension: Secondary | ICD-10-CM | POA: Insufficient documentation

## 2012-12-15 DIAGNOSIS — R918 Other nonspecific abnormal finding of lung field: Secondary | ICD-10-CM

## 2012-12-15 DIAGNOSIS — Z01812 Encounter for preprocedural laboratory examination: Secondary | ICD-10-CM | POA: Insufficient documentation

## 2012-12-15 DIAGNOSIS — E785 Hyperlipidemia, unspecified: Secondary | ICD-10-CM | POA: Insufficient documentation

## 2012-12-15 DIAGNOSIS — E079 Disorder of thyroid, unspecified: Secondary | ICD-10-CM | POA: Insufficient documentation

## 2012-12-15 DIAGNOSIS — F411 Generalized anxiety disorder: Secondary | ICD-10-CM | POA: Insufficient documentation

## 2012-12-15 DIAGNOSIS — F0390 Unspecified dementia without behavioral disturbance: Secondary | ICD-10-CM | POA: Insufficient documentation

## 2012-12-15 DIAGNOSIS — D381 Neoplasm of uncertain behavior of trachea, bronchus and lung: Secondary | ICD-10-CM

## 2012-12-15 DIAGNOSIS — M129 Arthropathy, unspecified: Secondary | ICD-10-CM | POA: Insufficient documentation

## 2012-12-15 DIAGNOSIS — R222 Localized swelling, mass and lump, trunk: Secondary | ICD-10-CM | POA: Insufficient documentation

## 2012-12-15 DIAGNOSIS — K219 Gastro-esophageal reflux disease without esophagitis: Secondary | ICD-10-CM | POA: Insufficient documentation

## 2012-12-15 DIAGNOSIS — Z01818 Encounter for other preprocedural examination: Secondary | ICD-10-CM | POA: Insufficient documentation

## 2012-12-15 HISTORY — PX: VIDEO BRONCHOSCOPY: SHX5072

## 2012-12-15 LAB — GLUCOSE, CAPILLARY: Glucose-Capillary: 181 mg/dL — ABNORMAL HIGH (ref 70–99)

## 2012-12-15 SURGERY — BRONCHOSCOPY, VIDEO-ASSISTED
Anesthesia: General | Site: Chest | Wound class: Clean

## 2012-12-15 MED ORDER — LIDOCAINE HCL 4 % MT SOLN
OROMUCOSAL | Status: DC | PRN
  Administered 2012-12-15: 4 mL via TOPICAL

## 2012-12-15 MED ORDER — OXYCODONE HCL 5 MG PO TABS: 5.0000 mg | ORAL_TABLET | Freq: Once | ORAL | Status: DC | PRN

## 2012-12-15 MED ORDER — LIDOCAINE HCL (CARDIAC) 20 MG/ML IV SOLN
INTRAVENOUS | Status: DC | PRN
  Administered 2012-12-15: 100 mg via INTRAVENOUS

## 2012-12-15 MED ORDER — FENTANYL CITRATE 0.05 MG/ML IJ SOLN: 25.0000 ug | INTRAMUSCULAR | Status: DC | PRN

## 2012-12-15 MED ORDER — EPINEPHRINE HCL 1 MG/ML IJ SOLN
INTRAMUSCULAR | Status: AC
  Filled 2012-12-15: qty 1

## 2012-12-15 MED ORDER — ONDANSETRON HCL 4 MG/2ML IJ SOLN
INTRAMUSCULAR | Status: DC | PRN
  Administered 2012-12-15 (×2): 4 mg via INTRAVENOUS

## 2012-12-15 MED ORDER — 0.9 % SODIUM CHLORIDE (POUR BTL) OPTIME
TOPICAL | Status: DC | PRN
  Administered 2012-12-15: 1000 mL

## 2012-12-15 MED ORDER — OXYCODONE HCL 5 MG/5ML PO SOLN: 5.0000 mg | Freq: Once | ORAL | Status: DC | PRN

## 2012-12-15 MED ORDER — PHENYLEPHRINE HCL 10 MG/ML IJ SOLN
INTRAMUSCULAR | Status: DC | PRN
  Administered 2012-12-15 (×4): 40 ug via INTRAVENOUS
  Administered 2012-12-15: 80 ug via INTRAVENOUS

## 2012-12-15 MED ORDER — NEOSTIGMINE METHYLSULFATE 1 MG/ML IJ SOLN
INTRAMUSCULAR | Status: DC | PRN
  Administered 2012-12-15: 5 mg via INTRAVENOUS

## 2012-12-15 MED ORDER — FENTANYL CITRATE 0.05 MG/ML IJ SOLN
INTRAMUSCULAR | Status: DC | PRN
  Administered 2012-12-15 (×2): 50 ug via INTRAVENOUS

## 2012-12-15 MED ORDER — GLYCOPYRROLATE 0.2 MG/ML IJ SOLN
INTRAMUSCULAR | Status: DC | PRN
  Administered 2012-12-15: .8 mg via INTRAVENOUS

## 2012-12-15 MED ORDER — MENTHOL 3 MG MT LOZG
LOZENGE | OROMUCOSAL | Status: AC
  Filled 2012-12-15: qty 9

## 2012-12-15 MED ORDER — ARTIFICIAL TEARS OP OINT
TOPICAL_OINTMENT | OPHTHALMIC | Status: DC | PRN
  Administered 2012-12-15: 1 via OPHTHALMIC

## 2012-12-15 MED ORDER — LACTATED RINGERS IV SOLN
INTRAVENOUS | Status: DC | PRN
  Administered 2012-12-15: 07:00:00 via INTRAVENOUS

## 2012-12-15 MED ORDER — PHENYLEPHRINE HCL 10 MG/ML IJ SOLN
10.0000 mg | INTRAVENOUS | Status: DC | PRN
  Administered 2012-12-15: 10 ug/min via INTRAVENOUS

## 2012-12-15 MED ORDER — PROPOFOL 10 MG/ML IV BOLUS
INTRAVENOUS | Status: DC | PRN
  Administered 2012-12-15: 10 mg via INTRAVENOUS
  Administered 2012-12-15: 20 mg via INTRAVENOUS
  Administered 2012-12-15: 120 mg via INTRAVENOUS
  Administered 2012-12-15: 20 mg via INTRAVENOUS
  Administered 2012-12-15: 30 mg via INTRAVENOUS

## 2012-12-15 MED ORDER — EPINEPHRINE HCL 1 MG/ML IJ SOLN
INTRAMUSCULAR | Status: DC | PRN
  Administered 2012-12-15: 1 mg via ENDOTRACHEOPULMONARY

## 2012-12-15 MED ORDER — DEXAMETHASONE SODIUM PHOSPHATE 10 MG/ML IJ SOLN
INTRAMUSCULAR | Status: DC | PRN
  Administered 2012-12-15: 10 mg via INTRAVENOUS

## 2012-12-15 MED ORDER — ALBUTEROL SULFATE HFA 108 (90 BASE) MCG/ACT IN AERS
INHALATION_SPRAY | RESPIRATORY_TRACT | Status: DC | PRN
  Administered 2012-12-15: 4 via RESPIRATORY_TRACT

## 2012-12-15 MED ORDER — ROCURONIUM BROMIDE 100 MG/10ML IV SOLN
INTRAVENOUS | Status: DC | PRN
  Administered 2012-12-15: 30 mg via INTRAVENOUS

## 2012-12-15 SURGICAL SUPPLY — 27 items
BALL CTTN LRG ABS STRL LF (GAUZE/BANDAGES/DRESSINGS)
BRUSH CYTOL CELLEBRITY 1.5X140 (MISCELLANEOUS) ×3 IMPLANT
BRUSH SUPERTRAX NDL-TIP CYTO (INSTRUMENTS) ×1 IMPLANT
CANISTER SUCTION 2500CC (MISCELLANEOUS) ×2 IMPLANT
CONT SPEC 4OZ CLIKSEAL STRL BL (MISCELLANEOUS) ×10 IMPLANT
COTTONBALL LRG STERILE PKG (GAUZE/BANDAGES/DRESSINGS) IMPLANT
COVER TABLE BACK 60X90 (DRAPES) ×2 IMPLANT
FILTER STRAW FLUID ASPIR (MISCELLANEOUS) ×2 IMPLANT
FORCEPS BIOP RJ4 1.8 (CUTTING FORCEPS) ×2 IMPLANT
GLOVE EUDERMIC 7 POWDERFREE (GLOVE) ×2 IMPLANT
GLOVE SURG SIGNA 7.5 PF LTX (GLOVE) ×1 IMPLANT
KIT ROOM TURNOVER OR (KITS) ×2 IMPLANT
NDL BIOPSY TRANSBRONCH 21G (NEEDLE) ×1 IMPLANT
NEEDLE 22X1 1/2 (OR ONLY) (NEEDLE) IMPLANT
NEEDLE BIOPSY TRANSBRONCH 21G (NEEDLE) ×2 IMPLANT
NS IRRIG 1000ML POUR BTL (IV SOLUTION) ×4 IMPLANT
PAD ARMBOARD 7.5X6 YLW CONV (MISCELLANEOUS) ×4 IMPLANT
SOLUTION ANTI FOG 6CC (MISCELLANEOUS) IMPLANT
SPONGE GAUZE 4X4 12PLY (GAUZE/BANDAGES/DRESSINGS) ×2 IMPLANT
SYR 30ML SLIP (SYRINGE) ×2 IMPLANT
SYR 5ML LL (SYRINGE) ×2 IMPLANT
SYR 5ML LUER SLIP (SYRINGE) ×2 IMPLANT
SYR CONTROL 10ML LL (SYRINGE) IMPLANT
TOWEL OR 17X24 6PK STRL BLUE (TOWEL DISPOSABLE) ×2 IMPLANT
TRAP SPECIMEN MUCOUS 40CC (MISCELLANEOUS) ×4 IMPLANT
TUBE CONNECTING 12X1/4 (SUCTIONS) ×2 IMPLANT
WATER STERILE IRR 1000ML POUR (IV SOLUTION) ×4 IMPLANT

## 2012-12-15 NOTE — H&P (View-Only) (Signed)
HPI:  Patient returns for routine postoperative follow-up having undergone  The patient's early postoperative recovery while in the hospital was notable for Since hospital discharge the patient reports  Past Medical History  Diagnosis Date  . HTN (hypertension)   . Hyperlipidemia   . Thyroid disease   . Diabetes mellitus   . Arthritis   . Anxiety     Current Outpatient Prescriptions  Medication Sig Dispense Refill  . cholecalciferol (VITAMIN D) 1000 UNITS tablet Take 1,000 Units by mouth daily.      . diazepam (VALIUM) 5 MG tablet Take 5 mg by mouth every 6 (six) hours as needed for anxiety.      . donepezil (ARICEPT) 5 MG tablet Take 5 mg by mouth at bedtime as needed.      [name] esomeprazole (NEXIUM) 40 MG capsule Take 40 mg by mouth daily before breakfast.        . ezetimibe (ZETIA) 10 MG tablet Take 10 mg by mouth daily.        . ferrous sulfate 325 (65 FE) MG tablet Take 325 mg by mouth 2 (two) times daily.      . fluticasone (FLONASE) 50 MCG/ACT nasal spray Place 2 sprays into the nose daily.      [name] levothyroxine (SYNTHROID, LEVOTHROID) 88 MCG tablet Take 100 mcg by mouth daily.       . meclizine (ANTIVERT) 25 MG tablet Take 25 mg by mouth 3 (three) times daily as needed for dizziness.      . metFORMIN (GLUCOPHAGE) 500 MG tablet Take 500 mg by mouth 2 (two) times daily with a meal.        . Multiple Vitamin (MULTIVITAMIN) tablet Take 1 tablet by mouth daily.      [name] olmesartan-hydrochlorothiazide (BENICAR HCT) 20-12.5 MG per tablet Take 1 tablet by mouth daily.      . promethazine (PHENERGAN) 25 MG tablet Take 25 mg by mouth every 6 (six) hours as needed for nausea.      . rosuvastatin (CRESTOR) 5 MG tablet Take 5 mg by mouth daily.         No current facility-administered medications for this visit.   Allergies  Allergen Reactions  . Bactrim [Sulfamethoxazole W-Trimethoprim] Hives  . Ciprofloxacin Hives  . Penicillins Swelling  . Welchol [Colesevelam Hcl] Other (See  Comments)    Body aches  . Zocor [Simvastatin] Other (See Comments)    Muscle aches     Physical Exam BP 131/79  Pulse 104  Resp 20  Ht 5\' 3"  (1.6 m)  Wt 146 lb (66.225 kg)  BMI 25.87 kg/m2  SpO88 36% 79 year old woman in no acute distress Neurologic alert and oriented x3 with no focal deficits No cervical or subclavicular adenopathy Lungs diminished breath sounds bilaterally no wheezing Cardiac regular rate and rhythm normal S1 and S2, no rub or murmur Abdomen soft nontender Extremities without clubbing cyanosis or edema  Diagnostic Tests: PET/CT Clinical Data: Initial treatment strategy for lung mass.  NUCLEAR MEDICINE PET SKULL BASE TO THIGH  Fasting Blood Glucose: 123  Technique: 18 mCi F-18 FDG was injected intravenously. CT data was  obtained and used for attenuation correction and anatomic  localization only. (This was not acquired as a diagnostic CT  examination.) Additional exam technical data entered on  technologist worksheet.  Comparison: CT chest 11/01/2012  Findings:  Neck: No hypermetabolic pulmonary nodule or mass noted. No  hypermetabolic adenopathy.  Chest: The right upper lobe paramediastinal mass abutting the  right  hilum is again noted. This measures 4.2 x 5.0 x 4.7 cm. The  SUV max associated this mass is equal to 20, image 76. There is  mild postobstructive atelectasis involving the anterior right upper  lobe. No significant FDG uptake is associated with the mediastinal  or hilar lymph nodes. Prominent low right paratracheal lymph node  measures 1.1 cm but does not exhibit any significant FDG uptake.  There is no uptake in the subcarinal region. The heart size is  moderately enlarged. There is no pericardial effusion. No pleural  effusion noted.  Abdomen/Pelvis: No abnormal hypermetabolic activity within the  liver, pancreas, adrenal glands, or spleen. No hypermetabolic  lymph nodes in the abdomen or pelvis. Nonobstructing calculus is  noted  within the upper pole of the left kidney. There is a cyst  within the right adnexal region which measures 3.8 cm. Advanced  calcified atherosclerotic disease affects the abdominal aorta and  its branches. No aneurysm noted.  Skeleton: No focal hypermetabolic activity to suggest skeletal  metastasis.  IMPRESSION:  1. The right upper lobe lung mass exhibits intense FDG uptake and  is compatible with primary lung neoplasm. There is associated mild  postobstructive atelectasis involving the anterior right upper  lobe.  2. No evidence for lymph node metastasis or distant metastatic  disease.  Original Report Authenticated By: Signa Kell, M.D.  MR brain CLINICAL DATA: 79 year old female with recently diagnosed lung  malignancy. Headaches, dizziness. History of hypertension.  EXAM:  MRI HEAD WITHOUT AND WITH CONTRAST  TECHNIQUE:  Multiplanar, multiecho pulse sequences of the brain and surrounding  structures were obtained according to standard protocol without and  with intravenous contrast  CONTRAST: 14mL MULTIHANCE GADOBENATE DIMEGLUMINE 529 MG/ML IV SOLN  COMPARISON: PET-CT 11/16/2012. Paranasal sinus CT 07/01/2004.  FINDINGS:  No midline shift, mass effect, or evidence of intracranial mass  lesion. No abnormal enhancement identified.  No restricted diffusion to suggest acute infarction. No  ventriculomegaly, extra-axial collection or acute intracranial  hemorrhage. Cervicomedullary junction and pituitary are within  normal limits. Negative visualized cervical spinal cord.  Major intracranial vascular flow voids are preserved. Scattered mild  for age cerebral white matter nonspecific T2 and FLAIR  hyperintensity. No cortical encephalomalacia. Deep gray matter  nuclei, brainstem, and cerebellum within normal limits for age.  Grossly normal visualized internal auditory structures.  Normal bone marrow signal. Negative orbits soft tissues. Minor  paranasal sinus mucosal thickening.  Mastoids are clear. Visualized  scalp soft tissues are within normal limits.  IMPRESSION:  No acute or metastatic intracranial abnormality. Mild for age  nonspecific white matter signal changes in the brain.  Electronically Signed  By: Augusto Gamble  Nuclear stress Normal with Diaphragm attenuation  Pulmonary function tests FVC 1.39 (61%) FEV1 1.07 (64%) FEV1 to FVC ratio 77% TLC 3.2 L (68%) DLCO 46% predicted  Impression: Mrs. Monday is an 79 year old gentleman history of tobacco abuse with a new right lung mass involving the upper and middle lobes. This is a relatively central mass. We do not yet have a diagnosis. That almost certainly is a new primary bronchogenic carcinoma. It is possibly resectable although it does abut the pulmonary artery to the lower lobe. There is no evidence of regional or distant metastases.  Her stress test showed no high risk features.  My main concern with surgery is her pulmonary reserve. Her predicted postresection FEV1 would be 0.8. Her diffusion capacity is already impaired and would be further compromise as well. I had a long discussion with  Mrs. Monday and her family regarding PFT results. They understand that she is absolutely on the borderline in terms of tolerating a resection. It really is impossible to predict whether she will have an acceptable lifestyle for pulmonary reserve standpoint after resection. It is clear that she would not tolerate a pneumonectomy, which this lesion might require.  I think at this point it would be reasonable to go ahead and do a biopsy so that we know exactly what we are dealing with. I do think that will help them with decision making.  The did have questions about treating with chemoradiation. I explained to them that this would be the alternative to surgery. There is less up front morbidity and mortality but less chance of cure with that approach as well.  I discussed with them the nature of bronchoscopy for biopsy.  They understand this is diagnostic inaccuracy. We discussed the indications, risks, benefits, and alternatives. They understand that there is risk associated with the anesthesia/sedation. They understand there is a risk of bleeding, pneumothorax, failure to make a diagnosis, and laryngospasm associated with the bronchoscopy. She understands the risks and wishes to proceed   Plan: Bronchoscopy for biopsy on Friday, September 19.

## 2012-12-15 NOTE — Brief Op Note (Signed)
12/15/2012  9:42 AM  PATIENT:  Peggy Espinoza  79 y.o. female  PRE-OPERATIVE DIAGNOSIS:  (R) LUNG MASS  POST-OPERATIVE DIAGNOSIS:  (R) LUNG MASS  PROCEDURE:  Procedure(s): VIDEO BRONCHOSCOPY (N/A)with brushings, biopsies, needle aspirations and BAL  SURGEON:  Surgeon(s) and Role:    * Loreli Slot, MD - Primary   ANESTHESIA:   general  EBL:  Total I/O In: 500 [I.V.:500] Out: -   BLOOD ADMINISTERED:none  DRAINS: none   SPECIMEN:  Source of Specimen:  RUL and RML  DISPOSITION OF SPECIMEN:  PATHOLOGY  PLAN OF CARE: Discharge to home after PACU  PATIENT DISPOSITION:  PACU - hemodynamically stable.   Delay start of Pharmacological VTE agent (>24hrs) due to surgical blood loss or risk of bleeding: not applicable

## 2012-12-15 NOTE — Preoperative (Signed)
Beta Blockers   Reason not to administer Beta Blockers:Not Applicable 

## 2012-12-15 NOTE — Interval H&P Note (Signed)
History and Physical Interval Note:  12/15/2012 7:21 AM  Peggy Espinoza  has presented today for surgery, with the diagnosis of (R) LUNG MASS  The various methods of treatment have been discussed with the patient and family. After consideration of risks, benefits and other options for treatment, the patient has consented to  Procedure(s): VIDEO BRONCHOSCOPY (N/A) as a surgical intervention .  The patient's history has been reviewed, patient examined, no change in status, stable for surgery.  I have reviewed the patient's chart and labs.  Questions were answered to the patient's satisfaction.     Adine Heimann C

## 2012-12-15 NOTE — Transfer of Care (Signed)
Immediate Anesthesia Transfer of Care Note  Patient: Peggy Espinoza  Procedure(s) Performed: Procedure(s): VIDEO BRONCHOSCOPY (N/A)  Patient Location: PACU  Anesthesia Type:General  Level of Consciousness: awake, oriented and patient cooperative  Airway & Oxygen Therapy: Patient Spontanous Breathing and Patient connected to face mask oxygen  Post-op Assessment: Report given to PACU RN and Post -op Vital signs reviewed and stable  Post vital signs: Reviewed and stable  Complications: No apparent anesthesia complications

## 2012-12-15 NOTE — Anesthesia Procedure Notes (Signed)
Procedure Name: Intubation Date/Time: 12/15/2012 7:37 AM Performed by: Sherie Don Pre-anesthesia Checklist: Patient identified, Emergency Drugs available, Suction available, Patient being monitored and Timeout performed Patient Re-evaluated:Patient Re-evaluated prior to inductionOxygen Delivery Method: Circle system utilized Preoxygenation: Pre-oxygenation with 100% oxygen Intubation Type: IV induction Ventilation: Mask ventilation without difficulty and Oral airway inserted - appropriate to patient size Laryngoscope Size: Mac and 3 Tube size: 8.5 mm Number of attempts: 1 Airway Equipment and Method: Stylet and Oral airway Placement Confirmation: ETT inserted through vocal cords under direct vision,  positive ETCO2 and breath sounds checked- equal and bilateral Secured at: 22 cm Tube secured with: Tape Dental Injury: Teeth and Oropharynx as per pre-operative assessment

## 2012-12-15 NOTE — Anesthesia Preprocedure Evaluation (Signed)
Anesthesia Evaluation  Patient identified by MRN, date of birth, ID band Patient awake    Reviewed: Allergy & Precautions, H&P , NPO status , Patient's Chart, lab work & pertinent test results  Airway Mallampati: II TM Distance: <3 FB Neck ROM: Full    Dental   Pulmonary  + rhonchi         Cardiovascular hypertension, Rhythm:Regular Rate:Normal     Neuro/Psych Anxiety dementia    GI/Hepatic GERD-  ,  Endo/Other  diabetes  Renal/GU      Musculoskeletal   Abdominal   Peds  Hematology   Anesthesia Other Findings   Reproductive/Obstetrics                           Anesthesia Physical Anesthesia Plan  ASA: III  Anesthesia Plan: General   Post-op Pain Management:    Induction: Intravenous  Airway Management Planned: Oral ETT  Additional Equipment:   Intra-op Plan:   Post-operative Plan: Extubation in OR  Informed Consent: I have reviewed the patients History and Physical, chart, labs and discussed the procedure including the risks, benefits and alternatives for the proposed anesthesia with the patient or authorized representative who has indicated his/her understanding and acceptance.     Plan Discussed with: CRNA and Surgeon  Anesthesia Plan Comments:         Anesthesia Quick Evaluation

## 2012-12-16 NOTE — Op Note (Signed)
NAMEELI, ADAMI                 ACCOUNT NO.:  [id number]  MEDICAL RECORD NO.:  [id number]  LOCATION:  [facility]                         FACILITY:  [facility]  PHYSICIAN:  Salvatore Decent. Dorris Fetch, M.D.DATE OF BIRTH:  20-Jan-1930  DATE OF PROCEDURE:  12/15/2012 DATE OF DISCHARGE:  12/15/2012                              OPERATIVE REPORT   PREOPERATIVE DIAGNOSIS:  Right upper and middle lobe mass.  POSTOPERATIVE DIAGNOSIS:  Right upper and middle lobe mass.  PROCEDURE:  Video bronchoscopy with brushings, biopsies, needle aspirations, and bronchoalveolar lavage.  SURGEON:  Salvatore Decent. Dorris Fetch, M.D.  ASSISTANT:  None.  ANESTHESIA:  General.  FINDINGS:  Normal endobronchial anatomy.  No endobronchial lesions. Initial brushings of the right upper lobe showed only benign bronchial epithelial cells.  Remainder of specimen sent for permanent pathology only.  CLINICAL NOTE:  Mr. Cavalieri is an 79 year old woman who recently was found to have a right lung mass.  This was a large mass involving both the upper and middle lobes.  She presents today for bronchoscopy for diagnostic purposes.  The indications, risks, benefits, and alternatives were discussed in detail with the patient and her daughter.  They understand this is a diagnostic and not a therapeutic procedure.  They understand the risks include, but are not limited to associated with general anesthesia, failure to make a diagnosis, bleeding, pneumothorax, and laryngospasm.  The patient accepted the risk and wished to proceed.  OPERATIVE NOTE:  Ms. Odenthal was brought to the operating room on December 15, 2012.  There she had induction of general anesthesia. Flexible fiberoptic bronchoscopy was performed via the endotracheal tube.  On the left side, there was normal endobronchial anatomy and no endobronchial lesions were seen.  On the right side, it was similar and there were no endobronchial mass lesions seen and there was  normal endobronchial anatomy.  The posterior segmental bronchus of the right upper lobe was identified and brushings were taken from this area.  These were sent for preliminary quick prep.  While awaiting the results of that, biopsies were taken from the area.  Additional brushings were performed and bronchoalveolar lavage was performed as well.  There was bleeding. Dilute epinephrine solution was applied endobronchially, which did decrease the bleeding.  Initial quick prep returned nondiagnostic with only benign bronchial epithelial cells seen.  Additional specimens were taken from both the posterior and anterior segmental bronchi.  Next, attention was turned to the right middle lobe bronchus.  Brushings and biopsies were obtained from both the medial and lateral segmental bronchi.  Again, there was bleeding with biopsies, which was cleared with saline and diluted epinephrine was applied in this area as well. It could be seen on the CT scan that the mass was splaying the bronchi out.  Needle aspirates were obtained from the carina of the right middle lobe bronchus.  Needle brushings were obtained as well.  All of the specimens and the bronchoalveolar lavage washings were also sent for permanent pathology.  Final inspection was made for hemostasis. There was no evidence of ongoing bleeding.  The scope was withdrawn. The patient was extubated in the operating room and taken to the postanesthetic care unit in  good condition.     Salvatore Decent Dorris Fetch, M.D.     SCH/MEDQ  D:  12/15/2012  T:  12/16/2012  Job:  [id number]

## 2012-12-18 ENCOUNTER — Encounter (HOSPITAL_COMMUNITY): Payer: Self-pay | Admitting: Thoracic Surgery (Cardiothoracic Vascular Surgery)

## 2012-12-18 NOTE — Anesthesia Postprocedure Evaluation (Signed)
  Anesthesia Post-op Note  Patient: Peggy Espinoza  Procedure(s) Performed: Procedure(s): VIDEO BRONCHOSCOPY (N/A)  Patient Location: PACU  Anesthesia Type:General  Level of Consciousness: awake and alert   Airway and Oxygen Therapy: Patient Spontanous Breathing  Post-op Pain: none  Post-op Assessment: Post-op Vital signs reviewed, Patient's Cardiovascular Status Stable, Respiratory Function Stable, Patent Airway, No signs of Nausea or vomiting and Pain level controlled  Post-op Vital Signs: stable  Complications: No apparent anesthesia complications

## 2012-12-19 ENCOUNTER — Ambulatory Visit: Payer: Medicare Other | Admitting: Thoracic Surgery (Cardiothoracic Vascular Surgery)

## 2012-12-20 ENCOUNTER — Encounter: Payer: Self-pay | Admitting: Thoracic Surgery (Cardiothoracic Vascular Surgery)

## 2012-12-20 ENCOUNTER — Ambulatory Visit (INDEPENDENT_AMBULATORY_CARE_PROVIDER_SITE_OTHER): Payer: Medicare Other | Admitting: Thoracic Surgery (Cardiothoracic Vascular Surgery)

## 2012-12-20 VITALS — BP 118/76 | HR 110 | Resp 20 | Ht 65.0 in | Wt 152.0 lb

## 2012-12-20 DIAGNOSIS — R222 Localized swelling, mass and lump, trunk: Secondary | ICD-10-CM

## 2012-12-20 DIAGNOSIS — R918 Other nonspecific abnormal finding of lung field: Secondary | ICD-10-CM

## 2012-12-20 DIAGNOSIS — Z9889 Other specified postprocedural states: Secondary | ICD-10-CM

## 2012-12-20 NOTE — Progress Notes (Signed)
Patient ID: Peggy Espinoza, female   DOB: Dec 05, 1929, 79 y.o.   MRN: [id number]  Mrs. Peggy Espinoza returns today to discuss the results of her bronchoscopy.  She is an 79 year old woman with a right lung mass. We did bronchoscopy with biopsies brushings and washings and also some transbronchial needle biopsies. 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  Had a long discussion with [name] and her daughter. She is elderly and has marginal pulmonary reserve. This lesion is rather large and crosses the minor fissure and would require resecting the right upper and middle lobes. Even that might not be sufficient as the lesion sits right at the main pulmonary artery and likely would require a pneumonectomy for a complete resection. She most definitely is not a candidate for pneumonectomy. She really is very marginal given her age and pulmonary function to even consider a lobectomy, and I don't think surgery would be a good idea in her case.   I recommended that she followup with oncology up at [facility] to discuss chemotherapy and radiation therapy.

## 2012-12-25 ENCOUNTER — Telehealth: Payer: Self-pay | Admitting: *Deleted

## 2012-12-25 NOTE — Telephone Encounter (Signed)
Spoke with pt daughter regarding appt for mtoc 12/28/12 at 2:45.  She verbalized understanding of app time and place

## 2012-12-26 ENCOUNTER — Telehealth: Payer: Self-pay | Admitting: *Deleted

## 2012-12-26 ENCOUNTER — Telehealth: Payer: Self-pay | Admitting: Internal Medicine

## 2012-12-26 ENCOUNTER — Ambulatory Visit: Payer: Medicare Other | Admitting: Cardiovascular Disease

## 2012-12-26 NOTE — Telephone Encounter (Signed)
Spoke with daughter regarding appt for Thursday, she is aware.

## 2012-12-26 NOTE — Telephone Encounter (Signed)
C/D 12/26/12 for appt. 12/28/12 °

## 2012-12-27 ENCOUNTER — Encounter: Payer: Self-pay | Admitting: *Deleted

## 2012-12-27 NOTE — Progress Notes (Unsigned)
Abstraction: Pt presented with weight loss and frequent cough. Treated with antibiotics. No improvement led to CXR  CT Chest

## 2012-12-27 NOTE — Progress Notes (Signed)
Abstraction:  Pt presented with weight loss and frequent cough.  Treated with antibiotics. No improvement led to CXR  CT Chest 8/6: CT CHEST WITH CONTRAST  Technique: Multidetector CT imaging of the chest was performed  following the standard protocol during bolus administration of  intravenous contrast.  Contrast: 80mL OMNIPAQUE IOHEXOL 300 MG/ML SOLN  Comparison: Chest x-ray 11/01/2012.  Findings:  Mediastinum: Heart size is normal. There is no significant  pericardial fluid, thickening or pericardial calcification. There  is atherosclerosis of the thoracic aorta, the great vessels of the  mediastinum and the coronary arteries, including calcified  atherosclerotic plaque in the left anterior descending and right  coronary arteries. No pathologically enlarged mediastinal or hilar  lymph nodes. Moderate sized hiatal hernia with some circumferential  thickening of the distal third of the esophagus.  Lungs/Pleura: In the anterior aspect of the right lung there is a  large mass which measures approximately 4.6 x 4.3 x 5.8 cm. This  mass involves the central portions of both the right middle and  upper lobes, and clearly violates the minor fissure (best  demonstrated on sagittal reconstructions). Extending anteriorly  from this mass there is some nodular thickening of the minor  fissure. The portion of the mass within the right middle lobe  splays the bronchi extending to the medial and lateral segments of  the right middle lobe. It demonstrates heterogeneous peripheral  enhancement with central areas of lower attenuation, which may  represent some internal necrosis. No additional suspicious  appearing pulmonary nodules or masses are otherwise identified.  Small pleural-based calcification in the medial aspect of the left  lower lobe likely represents calcified granuloma. A small area of  scarring is noted in the medial aspect of the left lower lobe. No  acute consolidative airspace  disease. No pleural effusions.  Upper Abdomen: Status post cholecystectomy. Multiple small  calcifications within the liver compatible with calcified  granulomas. A 1.6 cm well-defined low attenuation lesion in the  upper pole of the left kidney compatible with a simple cyst.  Atherosclerosis.  Musculoskeletal: There are no aggressive appearing lytic or blastic  lesions noted in the visualized portions of the skeleton.  IMPRESSION:  1. 4.6 x 4.3 x 5.8 cm mass in the right lung involving the central  portions of both the right middle and upper lobes. No associated  hilar or mediastinal adenopathy at this time. Based on the  aggressive appearance extending across the minor fissure, this is  compatible with a T4 lesion. Assuming no distal metastases (which  needs to be confirmed), this appears to represent T4, N0, M0  disease (i.e., this may represent Stage III-A disease). Given the  proximity of this lesion into the right middle lobe bronchi  (discussed above), this should be amendable to bronchoscopic guided  biopsy. Surgical consultation is recommended.  2. Atherosclerosis, including left main and three-vessel coronary  artery disease.  3. Moderate sized hiatal hernia with some mild circumferential  thickening of the distal third of the esophagus. These findings  may simply reflect a reflux esophagitis, however if there is any  clinical concern for Barrett's metaplasia or esophageal neoplasia,  further evaluation with endoscopy should be considered.  4. Sequelae of old granulomatous disease, as above.  5. Status post cholecystectomy.  These results were called by telephone on 11/01/2012 at 11:20 a.m.  to Dr. Phillips Odor, who verbally acknowledged these results.  Pathology: 12/15/12 Bronch RML malignant cells consistent with squamous cell carcinoma  PMHx:Past Medical History  Diagnosis  HTN (hypertension)  Hyperlipidemia  Thyroid disease  Diabetes mellitus  Arthritis  Anxiety    Medications:    Peggy Espinoza MRN: [id number]  Patient Med List Description: 79 year old female         Allergies as of 12/27/2012 Reviewed on: 12/20/2012    Allergen Noted Type Reactions   Bactrim [Sulfamethoxazole-Trimethoprim] 11/07/2012  Allergy Hives   Ciprofloxacin 11/07/2012  Allergy Hives   Penicillins 01/26/2011  Allergy Swelling   Welchol [Colesevelam Hcl] 11/07/2012  Intolerance Other (See Comments)   Body aches   Zocor [Simvastatin] 11/07/2012  Intolerance Other (See Comments)   Muscle aches          This is your current Medication list. Please update and make changes      Medications Valid as of: December 27, 2012 - 11:46 AM    Generic Name Brand Name Tablet Size Instructions for use   Cholecalciferol (Tab) Vitamin D 2000 UNITS Take 2,000 Units by mouth daily.      Diazepam (Tab) VALIUM 5 MG Take 5 mg by mouth 4 (four) times daily as needed (dizziness).       Donepezil HCl (Tab) ARICEPT 5 MG Take 5 mg by mouth at bedtime.       Esomeprazole Magnesium (Capsule Delayed Release) NEXIUM 40 MG Take 40 mg by mouth daily before breakfast.       Ezetimibe (Tab) ZETIA 10 MG Take 10 mg by mouth daily.       Ferrous Sulfate (Tab) ferrous sulfate 325 (65 FE) MG Take 325 mg by mouth 2 (two) times daily.      Fluticasone Propionate (Suspension) FLONASE 50 MCG/ACT Place 2 sprays into the nose daily as needed for rhinitis.       Levothyroxine Sodium (Tab) SYNTHROID, LEVOTHROID 88 MCG Take 88 mcg by mouth daily.       Meclizine HCl (Tab) ANTIVERT 25 MG Take 25 mg by mouth every 4 (four) hours as needed for dizziness.       MetFORMIN HCl (Tab) GLUCOPHAGE 500 MG Take 500 mg by mouth 2 (two) times daily with a meal.       Multiple Vitamin (Tab) multivitamin  Take 1 tablet by mouth daily.      Naproxen Sodium (Tab) ANAPROX 220 MG Take 440 mg by mouth daily as needed (pain).      Olmesartan Medoxomil-HCTZ (Tab) BENICAR HCT 20-12.5 MG Take 1 tablet by mouth daily with  breakfast.       Promethazine HCl (Tab) PHENERGAN 25 MG Take 25 mg by mouth every 4 (four) hours as needed for nausea.       Rosuvastatin Calcium (Tab) CRESTOR 5 MG Take 5 mg by mouth at bedtime.       [name]         [name]

## 2012-12-28 ENCOUNTER — Encounter: Payer: Self-pay | Admitting: *Deleted

## 2012-12-28 ENCOUNTER — Ambulatory Visit
Admission: RE | Admit: 2012-12-28 | Discharge: 2012-12-28 | Disposition: A | Discharge status: Home / Self Care | Payer: Medicare Other | Source: Ambulatory Visit | Attending: Radiation Oncology | Admitting: Radiation Oncology

## 2012-12-28 ENCOUNTER — Ambulatory Visit: Payer: Medicare Other | Attending: Internal Medicine | Admitting: Physical Therapy

## 2012-12-28 ENCOUNTER — Ambulatory Visit (HOSPITAL_BASED_OUTPATIENT_CLINIC_OR_DEPARTMENT_OTHER): Payer: Medicare Other | Admitting: Internal Medicine

## 2012-12-28 ENCOUNTER — Telehealth: Payer: Self-pay | Admitting: Internal Medicine

## 2012-12-28 VITALS — BP 107/56 | HR 106 | Temp 97.9°F | Resp 18 | Ht 65.0 in | Wt 153.4 lb

## 2012-12-28 DIAGNOSIS — C3491 Malignant neoplasm of unspecified part of right bronchus or lung: Secondary | ICD-10-CM

## 2012-12-28 DIAGNOSIS — I10 Essential (primary) hypertension: Secondary | ICD-10-CM | POA: Insufficient documentation

## 2012-12-28 DIAGNOSIS — R5381 Other malaise: Secondary | ICD-10-CM

## 2012-12-28 DIAGNOSIS — Z87891 Personal history of nicotine dependence: Secondary | ICD-10-CM

## 2012-12-28 DIAGNOSIS — R293 Abnormal posture: Secondary | ICD-10-CM | POA: Insufficient documentation

## 2012-12-28 DIAGNOSIS — Z800 Family history of malignant neoplasm of digestive organs: Secondary | ICD-10-CM

## 2012-12-28 DIAGNOSIS — C3430 Malignant neoplasm of lower lobe, unspecified bronchus or lung: Secondary | ICD-10-CM

## 2012-12-28 DIAGNOSIS — R05 Cough: Secondary | ICD-10-CM

## 2012-12-28 DIAGNOSIS — F411 Generalized anxiety disorder: Secondary | ICD-10-CM

## 2012-12-28 DIAGNOSIS — R059 Cough, unspecified: Secondary | ICD-10-CM

## 2012-12-28 DIAGNOSIS — IMO0001 Reserved for inherently not codable concepts without codable children: Secondary | ICD-10-CM | POA: Insufficient documentation

## 2012-12-28 DIAGNOSIS — C3490 Malignant neoplasm of unspecified part of unspecified bronchus or lung: Secondary | ICD-10-CM | POA: Insufficient documentation

## 2012-12-28 DIAGNOSIS — C3410 Malignant neoplasm of upper lobe, unspecified bronchus or lung: Secondary | ICD-10-CM

## 2012-12-28 DIAGNOSIS — C342 Malignant neoplasm of middle lobe, bronchus or lung: Secondary | ICD-10-CM | POA: Insufficient documentation

## 2012-12-28 NOTE — Progress Notes (Signed)
[facility] Telephone:([phone]) [phone]   [phone] Multidisciplinary thoracic oncology clinic (MTOC) CONSULT NOTE  REFERRING PHYSICIAN: Dr. Charlett Lango.  REASON FOR CONSULTATION:  79 years old white female recently diagnosed with lung cancer.  HPI Peggy Espinoza is a 79 y.o. female with a past medical history significant for hypertension, dyslipidemia, hypothyroidism, diabetes mellitus, arthritis, and anxiety, depression as well as remote history of smoking but quit 27 years ago. The patient mentioned that she has been complaining of sinus problems and and was seen by her primary care physician Dr. Arliss Journey in [place] and treated with a course of antibiotic with no improvement. She started having cough and shortness of breath. Chest x-ray was performed on 11/01/2012 and it showed a mass in the right middle lobe region measuring 6.5 x 6.8 x 5.0 CM suspicious for neoplasm. This was followed by CT scan of the chest on the same day and it showed 4.6 x 4.3 x 5.8 CM mass in the right lung involving the central portions of both the right middle and upper lobes. There was no associated hilar or mediastinal adenopathy at this time. Based on the aggressive appearance extending across the minor fissure, this is compatible with a T4 lesion. The patient was referred to Dr. Dorris Fetch and she had MRI of the brain on 11/16/2012 that showed no evidence for metastatic disease to the brain. She also had a PET scan on the same day and it showed The right upper lobe paramediastinal mass abutting the right hilum is again noted. This measures 4.2 x 5.0 x 4.7 cm. The SUV max associated this mass is equal to 20 compatible with primary lung neoplasm. There is associated mild postobstructive atelectasis involving the anterior right upper lobe. No evidence for lymph node metastasis or distant metastatic disease. On 12/15/2012 the patient underwent a video bronchoscopy with brushings and biopsies  as well as needle aspiration and bronchoalveolar lavage of the right upper and middle lobe mass under the care of Dr. Dorris Fetch. The final pathology of the Wang needle aspiration of the right middle lobe (Accession: [id number]) showed malignant cells consistent with squamous cell carcinoma. The patient is not a surgical candidate for resection of her tumor based on the opinion of Dr. Dorris Fetch. He kindly referred the patient to me today for further evaluation and recommendation regarding treatment of her condition. When seen today the patient is feeling fine except for fatigue and lack of appetite. She also has mild depression and lost a few pounds recently. She denied having any significant chest pain, shortness of breath but continues to have mild dry cough with no hemoptysis. The patient denied having any visual changes or headache. She had mild nausea. Family history significant for a mother with history of pancreatic cancer and father with colon cancer. The patient is widowed and has 3 children. She was accompanied today by her daughter Peggy Espinoza. She is to work in a factory. She has a history of smoking more than one pack per day for around 15 years but quit 27 years ago. No history of alcohol or drug abuse.    @SFHPI @  Past Medical History  Diagnosis Date  . HTN (hypertension)   . Hyperlipidemia   . Thyroid disease   . Diabetes mellitus   . Anxiety   . History of stress test 11/2012    [id number]   . Hypothyroidism   . Dementia     pt. states daughter  got her started on Aricept   . GERD (gastroesophageal reflux disease)   . Arthritis     knees, legs, back  . Bladder incontinence     wears pad always     Past Surgical History  Procedure Laterality Date  . Arm fracture surgery    . Tonsillectomy    . Appendectomy    . Abdominal hysterectomy    . Lipoma removal    . Eye surgery    . Cholecystectomy    . Cataract extraction    . Fracture  surgery      plate in R hand - long time ago  . Carpal tunnel release      R arm  . Video bronchoscopy N/A 12/15/2012    Procedure: VIDEO BRONCHOSCOPY;  Surgeon: Loreli Slot, MD;  Location: [facility];  Service: Thoracic;  Laterality: N/A;    Family History  Problem Relation Age of Onset  . Heart disease    . Arthritis    . Cancer    . Diabetes      Social History History  Substance Use Topics  . Smoking status: Former Smoker -- 1.00 packs/day for 30 years    Types: Cigarettes    Start date: 03/29/1985  . Smokeless tobacco: Never Used  . Alcohol Use: No    Allergies  Allergen Reactions  . Bactrim [Sulfamethoxazole-Trimethoprim] Hives  . Ciprofloxacin Hives  . Penicillins Swelling  . Welchol [Colesevelam Hcl] Other (See Comments)    Body aches  . Zocor [Simvastatin] Other (See Comments)    Muscle aches    Current Outpatient Prescriptions  Medication Sig Dispense Refill  . Cholecalciferol (VITAMIN D) 2000 UNITS tablet Take 2,000 Units by mouth daily.      . diazepam (VALIUM) 5 MG tablet Take 5 mg by mouth 4 (four) times daily as needed (dizziness).       [name] donepezil (ARICEPT) 5 MG tablet Take 5 mg by mouth at bedtime.       [name] esomeprazole (NEXIUM) 40 MG capsule Take 40 mg by mouth daily before breakfast.        . ezetimibe (ZETIA) 10 MG tablet Take 10 mg by mouth daily.        . ferrous sulfate 325 (65 FE) MG tablet Take 325 mg by mouth 2 (two) times daily.      . fluticasone (FLONASE) 50 MCG/ACT nasal spray Place 2 sprays into the nose daily as needed for rhinitis.       [name] levothyroxine (SYNTHROID, LEVOTHROID) 88 MCG tablet Take 88 mcg by mouth daily.       . meclizine (ANTIVERT) 25 MG tablet Take 25 mg by mouth every 4 (four) hours as needed for dizziness.       . metFORMIN (GLUCOPHAGE) 500 MG tablet Take 500 mg by mouth 2 (two) times daily with a meal.        . Multiple Vitamin (MULTIVITAMIN) tablet Take 1 tablet by mouth daily.      . naproxen sodium (ANAPROX)  220 MG tablet Take 440 mg by mouth daily as needed (pain).      [name] olmesartan-hydrochlorothiazide (BENICAR HCT) 20-12.5 MG per tablet Take 1 tablet by mouth daily with breakfast.       . promethazine (PHENERGAN) 25 MG tablet Take 25 mg by mouth every 4 (four) hours as needed for nausea.       . rosuvastatin (CRESTOR) 5 MG tablet Take 5 mg by mouth at bedtime.  No current facility-administered medications for this visit.    Review of Systems  Constitutional: positive for fatigue Eyes: negative Ears, nose, mouth, throat, and face: negative Respiratory: positive for cough Cardiovascular: negative Gastrointestinal: negative Genitourinary:negative Integument/breast: negative Hematologic/lymphatic: negative Musculoskeletal:negative Neurological: negative Behavioral/Psych: positive for decreased appetite and depression Endocrine: negative Allergic/Immunologic: negative  Physical Exam  AVW:UJWJX, healthy, no distress, well nourished and well developed SKIN: skin color, texture, turgor are normal HEAD: Normocephalic, No masses, lesions, tenderness or abnormalities EYES: normal, PERRLA EARS: External ears normal, Canals clear OROPHARYNX:no exudate, no erythema and lips, buccal mucosa, and tongue normal  NECK: supple, no adenopathy, no JVD LYMPH:  no palpable lymphadenopathy, no hepatosplenomegaly BREAST:not examined LUNGS: clear to auscultation , and palpation HEART: regular rate & rhythm, no murmurs and no gallops ABDOMEN:abdomen soft, non-tender, normal bowel sounds and no masses or organomegaly BACK: Back symmetric, no curvature., No CVA tenderness EXTREMITIES:no joint deformities, effusion, or inflammation, no edema, no skin discoloration, no clubbing  NEURO: alert & oriented x 3 with fluent speech, no focal motor/sensory deficits  PERFORMANCE STATUS: ECOG 1  LABORATORY DATA: Lab Results  Component Value Date   WBC 8.7 12/14/2012   HGB 11.6* 12/14/2012   HCT 35.5*  12/14/2012   MCV 89.4 12/14/2012   PLT 320 12/14/2012      Chemistry      Component Value Date/Time   NA 137 12/14/2012 0853   K 4.1 12/14/2012 0853   CL 96 12/14/2012 0853   CO2 30 12/14/2012 0853   BUN 16 12/14/2012 0853   CREATININE 0.60 12/14/2012 0853      Component Value Date/Time   CALCIUM 9.9 12/14/2012 0853   ALKPHOS 129* 12/14/2012 0853   AST 17 12/14/2012 0853   ALT 12 12/14/2012 0853   BILITOT 0.3 12/14/2012 0853       RADIOGRAPHIC STUDIES: Dg Chest 2 View Within Previous 72 Hours.  Films Obtained On Friday Are Acceptable For Monday And Tuesday Cases  12/14/2012   CLINICAL DATA:  Lung mass  EXAM: CHEST  2 VIEW  COMPARISON:  PET-CT 11/16/2012  FINDINGS: 5 cm mass right upper lobe/ right middle lobe unchanged from prior studies. No significant effusion. Negative for pneumonia. Mild linear atelectasis or scarring in the left lung base. No significant adenopathy. Moderate hiatal hernia.  IMPRESSION: 5 cm lung mass on the right consistent with carcinoma. No change from recent studies.   Electronically Signed   By: Marlan Palau M.D.   On: 12/14/2012 09:19    ASSESSMENT: This is a very pleasant 79 years old white female recently diagnosed with a stage IIIA (T4, N0, M0) non-small cell lung cancer consistent with squamous cell carcinoma, involving the right upper and middle lobes. The patient is not a surgical candidate for resection.  PLAN: I have a lengthy discussion with the patient and her daughter today about her current disease stage, prognosis and treatment options. I recommended for the patient a course of concurrent chemoradiation with weekly carboplatin for AUC of 2 and paclitaxel 45 mg/M2 with radiation for 6-7 weeks. I discussed with the patient adverse effect of the chemotherapy including but not limited to alopecia, myelosuppression, nausea and vomiting, peripheral neuropathy, liver or renal dysfunction. The patient will be seen later today by Dr. Mitzi Hansen for evaluation and  discussion of the radiotherapy option. I will arrange for the patient to have a chemotherapy education class before starting the first cycle of her chemotherapy. I will call her pharmacy with prescription for Compazine 10 mg  by mouth every 6 hours as needed for nausea. I expect the patient to start the first cycle of her concurrent chemoradiation on 01/08/2013. She would come back for follow up visit in 3 weeks for evaluation and management any adverse effect of her chemotherapy. I gave the patient and her daughter that time to ask questions and I answered them completely to their satisfactions. The patient was advised to call immediately if she has any concerning symptoms in the interval. The patient voices understanding of current disease status and treatment options and is in agreement with the current care plan.  All questions were answered. The patient knows to call the clinic with any problems, questions or concerns. We can certainly see the patient much sooner if necessary.  Thank you so much for allowing me to participate in the care of Meliana A Sannes. I will continue to follow up the patient with you and assist in her care.  I spent 50 minutes counseling the patient face to face. The total time spent in the appointment was 70 minutes.  Josha Weekley K. 12/28/2012, 4:25 PM

## 2012-12-28 NOTE — Progress Notes (Signed)
   Thoracic Treatment Summary Name:Peggy Espinoza Date:12/28/2012 DOB:[birth year] Your Medical Team Medical Oncologist: Dr. Arbutus Ped Radiation Oncologist: Dr. Mitzi Hansen Surgeon: Dr. Dorris Fetch Type and Stage of Lung Cancer Non-Small Cell Carcinoma: Squamous Cell Small Cell Carcinoma: Limited, Extensive Stage Clinical Stage:  III   Clinical stage is based on radiology exams.  Pathological stage will be determined after surgery.  Staging is based on the size of the tumor, involvement of lymph nodes or not, and whether or not the cancer center has spread. Recommendations Recommendations: Concurrent chemoradiation therapy  These recommendations are based on information available as of today's consult.  This is subject to change depending further testing or exams. Next Steps Next Step: 1. Medical Oncology will set up chemotherapy and lab appointments 2. Radiation Oncology will set up radiation appointmetns including simulation and radiation therapy Barriers to Care What do you perceive as a potential barrier that may prevent you from receiving your treatment plan? Nothing perceived at this time.   Resource: [facility]CI lung cancer booklet given and briefly explained Cancer Center resources information Cancer Care www.cancercare.[address] [facility] [phone] Questions Willette Pa, RN BSN Thoracic Oncology Nurse Navigator at [phone]  [name] is a nurse navigator that is available to assist you through your cancer journey.  She can answer your questions and/or provide resources regarding your treatment plan, emotional support, or financial concerns.

## 2012-12-28 NOTE — Progress Notes (Signed)
[facility] Clinical Social Work  Clinical Social Work met with patient, patient's daughter, and medical oncologist for assessment of psychosocial needs.  Medical oncologist reviewed patient diagnosis and treatment plan with patient/family.  Peggy Espinoza is agreeable to treatment plan.  She expressed feeling somewhat anxious and feeling "down" since learning she had cancer.  CSW validated patient's feelings and encouraged her to contact CSW for support once she begins treatment.  Peggy Espinoza identifies her daughter, Peggy Espinoza, as her primary support. The patient lives alone in [place] and reports she is very active.     Kathrin Penner, MSW, LCSW Clinical Social Worker [facility] [phone]

## 2012-12-28 NOTE — Telephone Encounter (Signed)
Gave pt appt for lab and MD for October 2014, emailed Marcelino Duster regarding chemo

## 2012-12-29 ENCOUNTER — Telehealth: Payer: Self-pay | Admitting: Internal Medicine

## 2012-12-29 ENCOUNTER — Telehealth: Payer: Self-pay | Admitting: *Deleted

## 2012-12-29 MED ORDER — PROCHLORPERAZINE MALEATE 10 MG PO TABS: 10.0000 mg | ORAL_TABLET | Freq: Four times a day (QID) | ORAL | Status: DC | PRN

## 2012-12-29 NOTE — Telephone Encounter (Signed)
Per staff message and POF I have scheduled appts.  JMW  

## 2012-12-29 NOTE — Telephone Encounter (Signed)
Called pt's daughter left message regarding all appts including chemo for October

## 2012-12-30 ENCOUNTER — Encounter: Payer: Self-pay | Admitting: Internal Medicine

## 2012-12-30 NOTE — Patient Instructions (Signed)
We discussed your treatment options including concurrent chemoradiation

## 2013-01-01 ENCOUNTER — Ambulatory Visit
Admission: RE | Admit: 2013-01-01 | Discharge: 2013-01-01 | Disposition: A | Discharge status: Home / Self Care | Payer: Medicare Other | Source: Ambulatory Visit | Attending: Radiation Oncology | Admitting: Radiation Oncology

## 2013-01-01 ENCOUNTER — Ambulatory Visit (HOSPITAL_COMMUNITY): Payer: Medicare Other | Admitting: Oncology

## 2013-01-01 DIAGNOSIS — Z510 Encounter for antineoplastic radiation therapy: Secondary | ICD-10-CM | POA: Insufficient documentation

## 2013-01-01 DIAGNOSIS — C3490 Malignant neoplasm of unspecified part of unspecified bronchus or lung: Secondary | ICD-10-CM | POA: Insufficient documentation

## 2013-01-01 DIAGNOSIS — C3410 Malignant neoplasm of upper lobe, unspecified bronchus or lung: Secondary | ICD-10-CM

## 2013-01-01 HISTORY — DX: Malignant neoplasm of upper lobe, unspecified bronchus or lung: C34.10

## 2013-01-01 NOTE — Progress Notes (Signed)
Radiation Oncology         [phone] ________________________________  Name: Peggy Espinoza MRN: [id number]  Date: 12/28/2012  DOB: 1930-01-05  WU:[id number],NWGNFAO M, MD  Lorra Hals, MD  REFERRING PHYSICIAN: Loreli Slot, *   DIAGNOSIS: Non-small cell lung cancer  HISTORY OF PRESENT ILLNESS::Peggy Espinoza is a 79 y.o. female who is seen for an initial consultation visit. The patient indicates that he had some sinus drainage and was given a course of antibiotics with no improvement. This led to a chest x-ray when the patient describes some shortness of breath and cough. A chest x-ray was performed on 11/01/2012 and this demonstrated a right middle lobe mass suspicious for neoplasm. The patient therefore proceeded to undergo a CT scan of the chest on dates takes 2014. This revealed a 4.6 x 4.3 x 5.8 cm mass in the right lung involving the central portions of both the right middle and upper lobes. This was felt to be compatible with a T4 lesion. No mediastinal or more distant disease is seen.   A PET scan therefore was performed subsequently on 11/16/2012. This showed intense hypermetabolic activity compatible with a primary lung neoplasm. Some associated mild postobstructive atelectasis was seen. No evidence for lymph node metastasis consistent metastatic disease. An additional MRI scan of the brain was also performed on state which was negative for or metastatic intracranial abnormality.  The patient underwent a video bronchoscopy with brushings and biopsy as well as needle aspiration and bronchoalveolar lavage of the right upper/middle lobe mass through Dr. Dorris Fetch in surgical oncology. The final pathology revealed malignant cells consistent with squamous cell carcinoma.  The patient has been discussed at multidisciplinary thoracic conference this morning and I have been asked to see the patient today for consideration of radiotherapy.   PREVIOUS  RADIATION THERAPY: No   PAST MEDICAL HISTORY:  has a past medical history of HTN (hypertension); Hyperlipidemia; Thyroid disease; Diabetes mellitus; Anxiety; History of stress test (11/2012); Hypothyroidism; Dementia; GERD (gastroesophageal reflux disease); Arthritis; and Bladder incontinence.     PAST SURGICAL HISTORY: Past Surgical History  Procedure Laterality Date  . Arm fracture surgery    . Tonsillectomy    . Appendectomy    . Abdominal hysterectomy    . Lipoma removal    . Eye surgery    . Cholecystectomy    . Cataract extraction    . Fracture surgery      plate in R hand - long time ago  . Carpal tunnel release      R arm  . Video bronchoscopy N/A 12/15/2012    Procedure: VIDEO BRONCHOSCOPY;  Surgeon: Loreli Slot, MD;  Location: [facility] OR;  Service: Thoracic;  Laterality: N/A;     FAMILY HISTORY: family history includes Arthritis in an other family member; Cancer in an other family member; Diabetes in an other family member; Heart disease in an other family member.   SOCIAL HISTORY:  reports that she has quit smoking. Her smoking use included Cigarettes. She started smoking about 27 years ago. She has a 30 pack-year smoking history. She has never used smokeless tobacco. She reports that she does not drink alcohol or use illicit drugs.   ALLERGIES: Bactrim; Ciprofloxacin; Penicillins; Welchol; and Zocor   MEDICATIONS:  Current Outpatient Prescriptions  Medication Sig Dispense Refill  . Cholecalciferol (VITAMIN D) 2000 UNITS tablet Take 2,000 Units by mouth daily.      . diazepam (VALIUM) 5 MG tablet  Take 5 mg by mouth 4 (four) times daily as needed (dizziness).       [name] donepezil (ARICEPT) 5 MG tablet Take 5 mg by mouth at bedtime.       [name] esomeprazole (NEXIUM) 40 MG capsule Take 40 mg by mouth daily before breakfast.        . ezetimibe (ZETIA) 10 MG tablet Take 10 mg by mouth daily.        . ferrous sulfate 325 (65 FE) MG tablet Take 325 mg by mouth 2 (two) times  daily.      . fluticasone (FLONASE) 50 MCG/ACT nasal spray Place 2 sprays into the nose daily as needed for rhinitis.       [name] levothyroxine (SYNTHROID, LEVOTHROID) 88 MCG tablet Take 88 mcg by mouth daily.       . meclizine (ANTIVERT) 25 MG tablet Take 25 mg by mouth every 4 (four) hours as needed for dizziness.       . metFORMIN (GLUCOPHAGE) 500 MG tablet Take 500 mg by mouth 2 (two) times daily with a meal.        . Multiple Vitamin (MULTIVITAMIN) tablet Take 1 tablet by mouth daily.      . naproxen sodium (ANAPROX) 220 MG tablet Take 440 mg by mouth daily as needed (pain).      [name] olmesartan-hydrochlorothiazide (BENICAR HCT) 20-12.5 MG per tablet Take 1 tablet by mouth daily with breakfast.       . prochlorperazine (COMPAZINE) 10 MG tablet Take 1 tablet (10 mg total) by mouth every 6 (six) hours as needed.  30 tablet  0  . promethazine (PHENERGAN) 25 MG tablet Take 25 mg by mouth every 4 (four) hours as needed for nausea.       . rosuvastatin (CRESTOR) 5 MG tablet Take 5 mg by mouth at bedtime.        No current facility-administered medications for this encounter.     REVIEW OF SYSTEMS:  A 15 point review of systems is documented in the electronic medical record. This was obtained by the nursing staff. However, I reviewed this with the patient to discuss relevant findings and make appropriate changes.  Pertinent items are noted in HPI.    PHYSICAL EXAM:  vitals were not taken for this visit.  General: Well-developed, in no acute distress HEENT: Normocephalic, atraumatic; oral cavity clear Neck: Supple without any lymphadenopathy Cardiovascular: Regular rate and rhythm Respiratory: Clear to auscultation bilaterally GI: Soft, nontender, normal bowel sounds Extremities: No edema present Neuro: No focal deficits     LABORATORY DATA:  Lab Results  Component Value Date   WBC 8.7 12/14/2012   HGB 11.6* 12/14/2012   HCT 35.5* 12/14/2012   MCV 89.4 12/14/2012   PLT 320 12/14/2012   Lab  Results  Component Value Date   NA 137 12/14/2012   K 4.1 12/14/2012   CL 96 12/14/2012   CO2 30 12/14/2012   Lab Results  Component Value Date   ALT 12 12/14/2012   AST 17 12/14/2012   ALKPHOS 129* 12/14/2012   BILITOT 0.3 12/14/2012      RADIOGRAPHY: Dg Chest 2 View Within Previous 72 Hours.  Films Obtained On Friday Are Acceptable For Monday And Tuesday Cases  12/14/2012   CLINICAL DATA:  Lung mass  EXAM: CHEST  2 VIEW  COMPARISON:  PET-CT 11/16/2012  FINDINGS: 5 cm mass right upper lobe/ right middle lobe unchanged from prior studies. No significant effusion. Negative for pneumonia. Mild linear atelectasis  or scarring in the left lung base. No significant adenopathy. Moderate hiatal hernia.  IMPRESSION: 5 cm lung mass on the right consistent with carcinoma. No change from recent studies.   Electronically Signed   By: Marlan Palau M.D.   On: 12/14/2012 09:19       IMPRESSION: The patient has a new diagnosis of non-small cell lung cancer. This reveals a stage III tumor: T4N0M0, stage IIIA. The patient is not a candidate for surgical resection.  I therefore discussed with the patient a potential approximate 6-1/2 week course of treatment. We discussed the rationale of radiotherapy in such a setting. We discussed the benefits of treatment as well as the potential side effects and risks. All of her questions were answered. We did discuss the consensus recommendation for concurrent chemotherapy as well. All the patient's questions were answered and she is interested in proceeding with this treatment plan.   PLAN: The patient will proceed with a simulation in the near future such that we can begin treatment planning. I anticipate beginning the patient's course of radiotherapy on 01/08/2013.    I spent 60 minutes face to face with the patient and more than 50% of that time was spent in counseling and/or coordination of care.    ________________________________   Radene Gunning, MD, PhD

## 2013-01-02 NOTE — Progress Notes (Signed)
  Radiation Oncology         ([phone]) [phone] ________________________________  Name: Peggy Espinoza MRN: [id number]  Date: 01/01/2013  DOB: Aug 05, 1929  SIMULATION AND TREATMENT PLANNING NOTE  DIAGNOSIS:  Lung cancer  NARRATIVE:  The patient was brought to the CT Simulation planning suite.  Identity was confirmed.  All relevant records and images related to the planned course of therapy were reviewed.   Written consent to proceed with treatment was confirmed which was freely given after reviewing the details related to the planned course of therapy had been reviewed with the patient.  Then, the patient was set-up in a stable reproducible supine position for radiation therapy.  The patient's arms were raised above using a wing board device. CT images were obtained.  An isocenter was placed within the chest in relation to the target volume. Surface markings were placed.    The CT images were loaded into the planning software.  Then the target and avoidance structures were contoured.  Treatment planning then occurred.  The radiation prescription was entered and confirmed.  A total of 5 complex treatment devices were fabricated which correspond to the designed customized radiation treatment fields. Each of these customized fields/ complex treatment devices will be used on a daily basis during the radiation course. I have requested a 3D Simulation.  I have requested a DVH of the following structures: target volume, spinal cord, lungs, esophagus.   PLAN:  The patient will initially receive 60 Gy in 30 fractions. It is anticipated that the patient will then received a  6-10 gray boost. The patient's final total dose will be 66-70 gray.   The patient will receive chemotherapy during the course of radiation treatment. The patient may experience increased or overlapping toxicity due to this combined-modality approach and the patient will be monitored for such problems. This may include extra lab work as necessary.  This therefore constitutes a special treatment procedure.   ________________________________   Radene Gunning, MD, PhD

## 2013-01-02 NOTE — Progress Notes (Signed)
  Radiation Oncology         (336) [phone] ________________________________  Name: Peggy Espinoza MRN: [id number]  Date: 01/01/2013  DOB: [birth year]  RESPIRATORY MOTION MANAGEMENT SIMULATION  NARRATIVE:  In order to account for effect of respiratory motion on target structures and other organs in the planning and delivery of radiotherapy, this patient underwent respiratory motion management simulation.  To accomplish this, when the patient was brought to the CT simulation planning suite, 4D respiratoy motion management CT images were obtained.  The CT images were loaded into the planning software.  Then, using a variety of tools including Cine, MIP, and standard views, the target volume and planning target volumes (PTV) were delineated.  Avoidance structures were contoured.  Treatment planning then occurred.  Dose volume histograms were generated and reviewed for each of the requested structure.  The resulting plan was carefully reviewed and approved today.   ------------------------------------------------  Radene Gunning, MD, PhD

## 2013-01-03 ENCOUNTER — Other Ambulatory Visit: Payer: Medicare Other

## 2013-01-03 ENCOUNTER — Encounter: Payer: Self-pay | Admitting: *Deleted

## 2013-01-03 ENCOUNTER — Ambulatory Visit: Payer: Medicare Other | Admitting: Radiation Oncology

## 2013-01-08 ENCOUNTER — Ambulatory Visit
Admission: RE | Admit: 2013-01-08 | Discharge: 2013-01-08 | Disposition: A | Discharge status: Home / Self Care | Payer: Medicare Other | Source: Ambulatory Visit | Attending: Radiation Oncology | Admitting: Radiation Oncology

## 2013-01-08 ENCOUNTER — Other Ambulatory Visit: Payer: Medicare Other | Admitting: Lab

## 2013-01-08 ENCOUNTER — Ambulatory Visit (HOSPITAL_BASED_OUTPATIENT_CLINIC_OR_DEPARTMENT_OTHER): Payer: Medicare Other

## 2013-01-08 ENCOUNTER — Ambulatory Visit (HOSPITAL_BASED_OUTPATIENT_CLINIC_OR_DEPARTMENT_OTHER): Payer: Medicare Other | Admitting: Lab

## 2013-01-08 VITALS — BP 124/64 | HR 87 | Temp 97.9°F | Resp 18

## 2013-01-08 DIAGNOSIS — Z5111 Encounter for antineoplastic chemotherapy: Secondary | ICD-10-CM

## 2013-01-08 DIAGNOSIS — C3491 Malignant neoplasm of unspecified part of right bronchus or lung: Secondary | ICD-10-CM

## 2013-01-08 DIAGNOSIS — C3430 Malignant neoplasm of lower lobe, unspecified bronchus or lung: Secondary | ICD-10-CM

## 2013-01-08 DIAGNOSIS — C3410 Malignant neoplasm of upper lobe, unspecified bronchus or lung: Secondary | ICD-10-CM

## 2013-01-08 LAB — COMPREHENSIVE METABOLIC PANEL (CC13)
ALT: 11 U/L (ref 0–55)
AST: 16 U/L (ref 5–34)
Albumin: 3.3 g/dL — ABNORMAL LOW (ref 3.5–5.0)
Alkaline Phosphatase: 120 U/L (ref 40–150)
BUN: 13.2 mg/dL (ref 7.0–26.0)
Calcium: 10.1 mg/dL (ref 8.4–10.4)
Chloride: 100 mEq/L (ref 98–109)
Potassium: 4.9 mEq/L (ref 3.5–5.1)
Sodium: 140 mEq/L (ref 136–145)
Total Protein: 7.8 g/dL (ref 6.4–8.3)

## 2013-01-08 LAB — CBC WITH DIFFERENTIAL/PLATELET
BASO%: 0.3 % (ref 0.0–2.0)
HCT: 33.2 % — ABNORMAL LOW (ref 34.8–46.6)
HGB: 10.7 g/dL — ABNORMAL LOW (ref 11.6–15.9)
MCHC: 32.2 g/dL (ref 31.5–36.0)
MONO#: 0.5 10*3/uL (ref 0.1–0.9)
NEUT%: 61.9 % (ref 38.4–76.8)
RDW: 13.6 % (ref 11.2–14.5)
WBC: 7.1 10*3/uL (ref 3.9–10.3)
lymph#: 2.1 10*3/uL (ref 0.9–3.3)

## 2013-01-08 MED ORDER — ONDANSETRON 16 MG/50ML IVPB (CHCC)
INTRAVENOUS | Status: AC
  Filled 2013-01-08: qty 16

## 2013-01-08 MED ORDER — DEXAMETHASONE SODIUM PHOSPHATE 20 MG/5ML IJ SOLN
20.0000 mg | Freq: Once | INTRAMUSCULAR | Status: AC
  Administered 2013-01-08: 20 mg via INTRAVENOUS

## 2013-01-08 MED ORDER — DIPHENHYDRAMINE HCL 50 MG/ML IJ SOLN
50.0000 mg | Freq: Once | INTRAMUSCULAR | Status: AC
  Administered 2013-01-08: 50 mg via INTRAVENOUS

## 2013-01-08 MED ORDER — SODIUM CHLORIDE 0.9 % IV SOLN
Freq: Once | INTRAVENOUS | Status: AC
  Administered 2013-01-08: 12:00:00 via INTRAVENOUS

## 2013-01-08 MED ORDER — SODIUM CHLORIDE 0.9 % IV SOLN
147.0000 mg | Freq: Once | INTRAVENOUS | Status: AC
  Administered 2013-01-08: 150 mg via INTRAVENOUS
  Filled 2013-01-08: qty 15

## 2013-01-08 MED ORDER — PACLITAXEL CHEMO INJECTION 300 MG/50ML
45.0000 mg/m2 | Freq: Once | INTRAVENOUS | Status: AC
  Administered 2013-01-08: 78 mg via INTRAVENOUS
  Filled 2013-01-08: qty 13

## 2013-01-08 MED ORDER — DEXAMETHASONE SODIUM PHOSPHATE 20 MG/5ML IJ SOLN
INTRAMUSCULAR | Status: AC
  Filled 2013-01-08: qty 5

## 2013-01-08 MED ORDER — FAMOTIDINE IN NACL 20-0.9 MG/50ML-% IV SOLN
INTRAVENOUS | Status: AC
  Filled 2013-01-08: qty 50

## 2013-01-08 MED ORDER — DIPHENHYDRAMINE HCL 50 MG/ML IJ SOLN
INTRAMUSCULAR | Status: AC
  Filled 2013-01-08: qty 1

## 2013-01-08 MED ORDER — ONDANSETRON 16 MG/50ML IVPB (CHCC)
16.0000 mg | Freq: Once | INTRAVENOUS | Status: AC
  Administered 2013-01-08: 16 mg via INTRAVENOUS

## 2013-01-08 MED ORDER — FAMOTIDINE IN NACL 20-0.9 MG/50ML-% IV SOLN
20.0000 mg | Freq: Once | INTRAVENOUS | Status: AC
  Administered 2013-01-08: 20 mg via INTRAVENOUS

## 2013-01-08 NOTE — Patient Instructions (Signed)
[facility] Discharge Instructions for Patients Receiving Chemotherapy  Today you received the following chemotherapy agents taxol and carboplatin.  To help prevent nausea and vomiting after your treatment, we encourage you to take your nausea medication as prescribed.   If you develop nausea and vomiting that is not controlled by your nausea medication, call the clinic.   BELOW ARE SYMPTOMS THAT SHOULD BE REPORTED IMMEDIATELY:  *FEVER GREATER THAN 100.5 F  *CHILLS WITH OR WITHOUT FEVER  NAUSEA AND VOMITING THAT IS NOT CONTROLLED WITH YOUR NAUSEA MEDICATION  *UNUSUAL SHORTNESS OF BREATH  *UNUSUAL BRUISING OR BLEEDING  TENDERNESS IN MOUTH AND THROAT WITH OR WITHOUT PRESENCE OF ULCERS  *URINARY PROBLEMS  *BOWEL PROBLEMS  UNUSUAL RASH Items with * indicate a potential emergency and should be followed up as soon as possible.  Feel free to call the clinic you have any questions or concerns. The clinic phone number is [phone].    

## 2013-01-09 ENCOUNTER — Telehealth: Payer: Self-pay | Admitting: *Deleted

## 2013-01-09 ENCOUNTER — Ambulatory Visit
Admission: RE | Admit: 2013-01-09 | Discharge: 2013-01-09 | Disposition: A | Discharge status: Home / Self Care | Payer: Medicare Other | Source: Ambulatory Visit | Attending: Radiation Oncology | Admitting: Radiation Oncology

## 2013-01-09 ENCOUNTER — Encounter: Payer: Self-pay | Admitting: Orthopedic Surgery

## 2013-01-09 ENCOUNTER — Ambulatory Visit (INDEPENDENT_AMBULATORY_CARE_PROVIDER_SITE_OTHER): Payer: Medicare Other | Admitting: Orthopedic Surgery

## 2013-01-09 VITALS — BP 107/67 | Ht 63.5 in | Wt 157.0 lb

## 2013-01-09 DIAGNOSIS — M67919 Unspecified disorder of synovium and tendon, unspecified shoulder: Secondary | ICD-10-CM

## 2013-01-09 HISTORY — DX: Unspecified disorder of synovium and tendon, unspecified shoulder: M67.919

## 2013-01-09 NOTE — Progress Notes (Signed)
Patient ID: Peggy Espinoza, female   DOB: December 04, 1929, 79 y.o.   MRN: [id number]  Chief Complaint  Patient presents with  . Follow-up    left shoulder pain, request injection    Subacromial Shoulder Injection Procedure Note  Pre-operative Diagnosis: left RC Syndrome  Post-operative Diagnosis: same  Indications: pain   Anesthesia: ethyl chloride   Procedure Details   Verbal consent was obtained for the procedure. The shoulder was prepped withalcohol and the skin was anesthetized. A 20 gauge needle was advanced into the subacromial space through posterior approach without difficulty  The space was then injected with 3 ml 1% lidocaine and 1 ml of depomedrol. The injection site was cleansed with isopropyl alcohol and a dressing was applied.  Complications:  None; patient tolerated the procedure well.

## 2013-01-09 NOTE — Telephone Encounter (Signed)
ON PT.'S VOICE MAIL ENCOURAGED PT. TO FORCE FLUIDS. ALSO GAVE PT. INSTRUCTIONS TO CONTACT THE ON CALL PHYSICIAN IF NECESSARY.

## 2013-01-09 NOTE — Patient Instructions (Signed)
You have received a steroid shot. 15% of patients experience increased pain at the injection site with in the next 24 hours. This is best treated with ice and tylenol extra strength 2 tabs every 8 hours. If you are still having pain please call the office.    

## 2013-01-10 ENCOUNTER — Other Ambulatory Visit: Payer: Medicare Other | Admitting: Lab

## 2013-01-10 ENCOUNTER — Other Ambulatory Visit: Payer: Medicare Other

## 2013-01-10 ENCOUNTER — Ambulatory Visit
Admission: RE | Admit: 2013-01-10 | Discharge: 2013-01-10 | Disposition: A | Discharge status: Home / Self Care | Payer: Medicare Other | Source: Ambulatory Visit | Attending: Radiation Oncology | Admitting: Radiation Oncology

## 2013-01-11 ENCOUNTER — Ambulatory Visit
Admission: RE | Admit: 2013-01-11 | Discharge: 2013-01-11 | Disposition: A | Discharge status: Home / Self Care | Payer: Medicare Other | Source: Ambulatory Visit | Attending: Radiation Oncology | Admitting: Radiation Oncology

## 2013-01-11 MED ORDER — BIAFINE EX EMUL
CUTANEOUS | Status: DC | PRN
  Administered 2013-01-11: 12:00:00 via TOPICAL

## 2013-01-11 NOTE — Progress Notes (Signed)
Patient education done, radiation therapy and you book, biafine cream with instructions of use after rad txs daily,discussed fatigue, skin irritation, pain, throat changes,sob, all questins answered, also disussed  Taking stool softners ofr constipation either mira alx or natural prune juice 4oz prn 1:05 PM

## 2013-01-12 ENCOUNTER — Encounter: Payer: Self-pay | Admitting: Radiation Oncology

## 2013-01-12 ENCOUNTER — Ambulatory Visit
Admission: RE | Admit: 2013-01-12 | Discharge: 2013-01-12 | Disposition: A | Discharge status: Home / Self Care | Payer: Medicare Other | Source: Ambulatory Visit | Attending: Radiation Oncology | Admitting: Radiation Oncology

## 2013-01-12 NOTE — Progress Notes (Signed)
Weekly rad txs, 5/30 lung completed, c/o sinus pain, not taking meds every day as directed/mucinex,  Coughs up brownish phelgm, mostly in mornigs, 93% room air, poor appetrite,  Energy level, low, , reviewed pat education  With daughter Peggy Espinoza and patient 8:49 AM

## 2013-01-12 NOTE — Progress Notes (Signed)
Department of Radiation Oncology  Phone:  [phone] Fax:        [phone]  Weekly Treatment Note    Name: [name] Date: 01/12/2013 MRN: [id number] DOB: 02-10-1930   Current dose: 10 Gy  Current fraction: 5   MEDICATIONS: Current Outpatient Prescriptions  Medication Sig Dispense Refill  . Cholecalciferol (VITAMIN D) 2000 UNITS tablet Take 2,000 Units by mouth daily.      . diazepam (VALIUM) 5 MG tablet Take 5 mg by mouth 4 (four) times daily as needed (dizziness).       [name] donepezil (ARICEPT) 5 MG tablet Take 5 mg by mouth at bedtime.       [name] esomeprazole (NEXIUM) 40 MG capsule Take 40 mg by mouth daily before breakfast.        . ezetimibe (ZETIA) 10 MG tablet Take 10 mg by mouth daily.        . ferrous sulfate 325 (65 FE) MG tablet Take 325 mg by mouth 2 (two) times daily.      . fluticasone (FLONASE) 50 MCG/ACT nasal spray Place 2 sprays into the nose daily as needed for rhinitis.       [name] levothyroxine (SYNTHROID, LEVOTHROID) 88 MCG tablet Take 88 mcg by mouth daily.       . meclizine (ANTIVERT) 25 MG tablet Take 25 mg by mouth every 4 (four) hours as needed for dizziness.       . metFORMIN (GLUCOPHAGE) 500 MG tablet Take 500 mg by mouth 2 (two) times daily with a meal.        . Multiple Vitamin (MULTIVITAMIN) tablet Take 1 tablet by mouth daily.      . naproxen sodium (ANAPROX) 220 MG tablet Take 440 mg by mouth daily as needed (pain).      [name] olmesartan-hydrochlorothiazide (BENICAR HCT) 20-12.5 MG per tablet Take 1 tablet by mouth daily with breakfast.       . prochlorperazine (COMPAZINE) 10 MG tablet Take 1 tablet (10 mg total) by mouth every 6 (six) hours as needed.  30 tablet  0  . promethazine (PHENERGAN) 25 MG tablet Take 25 mg by mouth every 4 (four) hours as needed for nausea.       . rosuvastatin (CRESTOR) 5 MG tablet Take 5 mg by mouth at bedtime.       [name] ON 01/15/2013] emollient (BIAFINE) cream Apply 1 application topically daily. Apply to chest  after rad tx dialy       No current facility-administered medications for this encounter.     ALLERGIES: Bactrim; Ciprofloxacin; Penicillins; Welchol; and Zocor   LABORATORY DATA:  Lab Results  Component Value Date   WBC 7.1 01/08/2013   HGB 10.7* 01/08/2013   HCT 33.2* 01/08/2013   MCV 87.6 01/08/2013   PLT 347 01/08/2013   Lab Results  Component Value Date   NA 140 01/08/2013   K 4.9 01/08/2013   CL 96 12/14/2012   CO2 31* 01/08/2013   Lab Results  Component Value Date   ALT 11 01/08/2013   AST 16 01/08/2013   ALKPHOS 120 01/08/2013   BILITOT 0.29 01/08/2013     NARRATIVE: Peggy Espinoza was seen today for weekly treatment management. The chart was checked and the patient's films were reviewed. The patient is doing well after her first week of treatment. No new complaints. No shortness of breath.  PHYSICAL EXAMINATION: weight is 149 lb 4.8 oz (67.722 kg). Her oral temperature is 97.8 F (36.6 C).  Her blood pressure is 104/86 and her pulse is 93. Her respiration is 20 and oxygen saturation is 93%.        ASSESSMENT: The patient is doing satisfactorily with treatment.  PLAN: We will continue with the patient's radiation treatment as planned.

## 2013-01-15 ENCOUNTER — Ambulatory Visit: Payer: Medicare Other | Admitting: Physician Assistant

## 2013-01-15 ENCOUNTER — Ambulatory Visit: Payer: Medicare Other

## 2013-01-15 ENCOUNTER — Encounter (HOSPITAL_COMMUNITY): Payer: Self-pay | Admitting: Emergency Medicine

## 2013-01-15 ENCOUNTER — Other Ambulatory Visit: Payer: Medicare Other | Admitting: Lab

## 2013-01-15 ENCOUNTER — Emergency Department (HOSPITAL_COMMUNITY): Payer: Medicare Other

## 2013-01-15 ENCOUNTER — Inpatient Hospital Stay (HOSPITAL_COMMUNITY)
Admission: EM | Admit: 2013-01-15 | Discharge: 2013-01-22 | DRG: 871 | Disposition: A | Discharge status: Skilled Nursing Facility | Payer: Medicare Other | Attending: Internal Medicine | Admitting: Internal Medicine

## 2013-01-15 DIAGNOSIS — K219 Gastro-esophageal reflux disease without esophagitis: Secondary | ICD-10-CM | POA: Diagnosis present

## 2013-01-15 DIAGNOSIS — E119 Type 2 diabetes mellitus without complications: Secondary | ICD-10-CM | POA: Diagnosis present

## 2013-01-15 DIAGNOSIS — R32 Unspecified urinary incontinence: Secondary | ICD-10-CM | POA: Diagnosis present

## 2013-01-15 DIAGNOSIS — R5381 Other malaise: Secondary | ICD-10-CM | POA: Diagnosis present

## 2013-01-15 DIAGNOSIS — M2550 Pain in unspecified joint: Secondary | ICD-10-CM

## 2013-01-15 DIAGNOSIS — A419 Sepsis, unspecified organism: Principal | ICD-10-CM | POA: Diagnosis present

## 2013-01-15 DIAGNOSIS — E871 Hypo-osmolality and hyponatremia: Secondary | ICD-10-CM | POA: Diagnosis present

## 2013-01-15 DIAGNOSIS — M67919 Unspecified disorder of synovium and tendon, unspecified shoulder: Secondary | ICD-10-CM

## 2013-01-15 DIAGNOSIS — J189 Pneumonia, unspecified organism: Secondary | ICD-10-CM

## 2013-01-15 DIAGNOSIS — Z87891 Personal history of nicotine dependence: Secondary | ICD-10-CM

## 2013-01-15 DIAGNOSIS — D649 Anemia, unspecified: Secondary | ICD-10-CM

## 2013-01-15 DIAGNOSIS — F0390 Unspecified dementia without behavioral disturbance: Secondary | ICD-10-CM | POA: Diagnosis present

## 2013-01-15 DIAGNOSIS — R197 Diarrhea, unspecified: Secondary | ICD-10-CM | POA: Diagnosis present

## 2013-01-15 DIAGNOSIS — E039 Hypothyroidism, unspecified: Secondary | ICD-10-CM | POA: Diagnosis present

## 2013-01-15 DIAGNOSIS — T451X5A Adverse effect of antineoplastic and immunosuppressive drugs, initial encounter: Secondary | ICD-10-CM | POA: Diagnosis present

## 2013-01-15 DIAGNOSIS — I10 Essential (primary) hypertension: Secondary | ICD-10-CM | POA: Diagnosis present

## 2013-01-15 DIAGNOSIS — R578 Other shock: Secondary | ICD-10-CM | POA: Diagnosis present

## 2013-01-15 DIAGNOSIS — F411 Generalized anxiety disorder: Secondary | ICD-10-CM | POA: Diagnosis present

## 2013-01-15 DIAGNOSIS — C3490 Malignant neoplasm of unspecified part of unspecified bronchus or lung: Secondary | ICD-10-CM | POA: Diagnosis present

## 2013-01-15 DIAGNOSIS — R4182 Altered mental status, unspecified: Secondary | ICD-10-CM | POA: Diagnosis present

## 2013-01-15 DIAGNOSIS — D689 Coagulation defect, unspecified: Secondary | ICD-10-CM | POA: Diagnosis present

## 2013-01-15 DIAGNOSIS — E785 Hyperlipidemia, unspecified: Secondary | ICD-10-CM | POA: Diagnosis present

## 2013-01-15 DIAGNOSIS — M1710 Unilateral primary osteoarthritis, unspecified knee: Secondary | ICD-10-CM

## 2013-01-15 DIAGNOSIS — E876 Hypokalemia: Secondary | ICD-10-CM

## 2013-01-15 DIAGNOSIS — K5289 Other specified noninfective gastroenteritis and colitis: Secondary | ICD-10-CM | POA: Diagnosis present

## 2013-01-15 DIAGNOSIS — D6481 Anemia due to antineoplastic chemotherapy: Secondary | ICD-10-CM | POA: Diagnosis present

## 2013-01-15 DIAGNOSIS — R112 Nausea with vomiting, unspecified: Secondary | ICD-10-CM

## 2013-01-15 HISTORY — DX: Malignant neoplasm of unspecified part of unspecified bronchus or lung: C34.90

## 2013-01-15 LAB — MRSA PCR SCREENING: MRSA by PCR: POSITIVE — AB

## 2013-01-15 LAB — CG4 I-STAT (LACTIC ACID)
Lactic Acid, Venous: 2.73 mmol/L — ABNORMAL HIGH (ref 0.5–2.2)
Lactic Acid, Venous: 2.02 mmol/L (ref 0.5–2.2)

## 2013-01-15 LAB — PROCALCITONIN: Procalcitonin: 0.33 ng/mL

## 2013-01-15 LAB — COMPREHENSIVE METABOLIC PANEL
AST: 27 U/L (ref 0–37)
Albumin: 2.9 g/dL — ABNORMAL LOW (ref 3.5–5.2)
Alkaline Phosphatase: 92 U/L (ref 39–117)
CO2: 24 mEq/L (ref 19–32)
Chloride: 89 mEq/L — ABNORMAL LOW (ref 96–112)
Creatinine, Ser: 0.78 mg/dL (ref 0.50–1.10)
GFR calc non Af Amer: 76 mL/min — ABNORMAL LOW (ref >90)
Glucose, Bld: 226 mg/dL — ABNORMAL HIGH (ref 70–99)
Potassium: 3.4 mEq/L — ABNORMAL LOW (ref 3.5–5.1)
Sodium: 127 mEq/L — ABNORMAL LOW (ref 135–145)
Total Bilirubin: 0.9 mg/dL (ref 0.3–1.2)

## 2013-01-15 LAB — URINE MICROSCOPIC-ADD ON

## 2013-01-15 LAB — APTT: aPTT: 33 seconds (ref 24–37)

## 2013-01-15 LAB — CARBOXYHEMOGLOBIN
Carboxyhemoglobin: 1.5 % (ref 0.5–1.5)
Methemoglobin: 1.2 % (ref 0.0–1.5)
O2 Saturation: 70.4 %

## 2013-01-15 LAB — POCT I-STAT, CHEM 8
Calcium, Ion: 1.24 mmol/L (ref 1.13–1.30)
Chloride: 91 mEq/L — ABNORMAL LOW (ref 96–112)
Creatinine, Ser: 1 mg/dL (ref 0.50–1.10)
Hemoglobin: 10.5 g/dL — ABNORMAL LOW (ref 12.0–15.0)
Potassium: 2.9 mEq/L — ABNORMAL LOW (ref 3.5–5.1)
Sodium: 128 mEq/L — ABNORMAL LOW (ref 135–145)
TCO2: 24 mmol/L (ref 0–100)

## 2013-01-15 LAB — TYPE AND SCREEN
ABO/RH(D): O POS
Antibody Screen: NEGATIVE

## 2013-01-15 LAB — CBC WITH DIFFERENTIAL/PLATELET
Basophils Absolute: 0 10*3/uL (ref 0.0–0.1)
Basophils Relative: 0 % (ref 0–1)
Lymphs Abs: 0.9 10*3/uL (ref 0.7–4.0)
MCHC: 33.4 g/dL (ref 30.0–36.0)
Neutro Abs: 13.6 10*3/uL — ABNORMAL HIGH (ref 1.7–7.7)
Neutrophils Relative %: 87 % — ABNORMAL HIGH (ref 43–77)
RDW: 13.4 % (ref 11.5–15.5)

## 2013-01-15 LAB — FIBRINOGEN: Fibrinogen: >800 mg/dL — ABNORMAL HIGH (ref 204–475)

## 2013-01-15 LAB — URINALYSIS, ROUTINE W REFLEX MICROSCOPIC
Glucose, UA: NEGATIVE mg/dL
Hgb urine dipstick: NEGATIVE
Protein, ur: 30 mg/dL — AB
Urobilinogen, UA: 1 mg/dL (ref 0.0–1.0)

## 2013-01-15 LAB — GLUCOSE, CAPILLARY: Glucose-Capillary: 150 mg/dL — ABNORMAL HIGH (ref 70–99)

## 2013-01-15 LAB — ABO/RH: ABO/RH(D): O POS

## 2013-01-15 LAB — PROTIME-INR: INR: 1.19 (ref 0.00–1.49)

## 2013-01-15 LAB — CORTISOL: Cortisol, Plasma: 24.1 ug/dL

## 2013-01-15 LAB — TROPONIN I: Troponin I: <0.3 ng/mL (ref <0.30)

## 2013-01-15 MED ORDER — DEXTROSE 5 % IV SOLN
2.0000 g | Freq: Once | INTRAVENOUS | Status: AC
  Administered 2013-01-15: 2 g via INTRAVENOUS
  Filled 2013-01-15: qty 2

## 2013-01-15 MED ORDER — SODIUM CHLORIDE 0.9 % IV SOLN: 250.0000 mL | INTRAVENOUS | Status: DC | PRN

## 2013-01-15 MED ORDER — SODIUM CHLORIDE 0.9 % IV SOLN
INTRAVENOUS | Status: DC
  Administered 2013-01-15 – 2013-01-18 (×4): via INTRAVENOUS
  Administered 2013-01-20: 1000 mL via INTRAVENOUS
  Administered 2013-01-20 – 2013-01-22 (×2): via INTRAVENOUS

## 2013-01-15 MED ORDER — SODIUM CHLORIDE 0.9 % IV SOLN
1000.0000 mL | Freq: Once | INTRAVENOUS | Status: AC
  Administered 2013-01-15: 1000 mL via INTRAVENOUS

## 2013-01-15 MED ORDER — NOREPINEPHRINE BITARTRATE 1 MG/ML IJ SOLN
5.0000 ug/min | INTRAVENOUS | Status: DC
  Administered 2013-01-15: 5 ug/min via INTRAVENOUS
  Administered 2013-01-16: 10 ug/min via INTRAVENOUS
  Filled 2013-01-15 (×3): qty 4

## 2013-01-15 MED ORDER — VANCOMYCIN HCL IN DEXTROSE 1-5 GM/200ML-% IV SOLN
1000.0000 mg | Freq: Once | INTRAVENOUS | Status: AC
  Administered 2013-01-15: 1000 mg via INTRAVENOUS
  Filled 2013-01-15 (×2): qty 200

## 2013-01-15 MED ORDER — ENOXAPARIN SODIUM 40 MG/0.4ML ~~LOC~~ SOLN
40.0000 mg | SUBCUTANEOUS | Status: DC
  Administered 2013-01-15 – 2013-01-21 (×7): 40 mg via SUBCUTANEOUS
  Filled 2013-01-15 (×8): qty 0.4

## 2013-01-15 MED ORDER — VANCOMYCIN HCL 500 MG IV SOLR
500.0000 mg | Freq: Two times a day (BID) | INTRAVENOUS | Status: DC
  Administered 2013-01-15 – 2013-01-17 (×5): 500 mg via INTRAVENOUS
  Filled 2013-01-15 (×6): qty 500

## 2013-01-15 MED ORDER — DEXTROSE 5 % IV SOLN
1.0000 g | Freq: Three times a day (TID) | INTRAVENOUS | Status: DC
  Administered 2013-01-15 – 2013-01-20 (×14): 1 g via INTRAVENOUS
  Filled 2013-01-15 (×15): qty 1

## 2013-01-15 MED ORDER — VANCOMYCIN HCL IN DEXTROSE 1-5 GM/200ML-% IV SOLN: 1000.0000 mg | Freq: Once | INTRAVENOUS | Status: DC

## 2013-01-15 MED ORDER — LEVOTHYROXINE SODIUM 88 MCG PO TABS
88.0000 ug | ORAL_TABLET | Freq: Every day | ORAL | Status: DC
  Administered 2013-01-16 – 2013-01-22 (×7): 88 ug via ORAL
  Filled 2013-01-15 (×10): qty 1

## 2013-01-15 MED ORDER — SODIUM CHLORIDE 0.9 % IV BOLUS (SEPSIS)
1000.0000 mL | INTRAVENOUS | Status: DC | PRN
  Administered 2013-01-15 (×3): 1000 mL via INTRAVENOUS

## 2013-01-15 MED ORDER — CHLORHEXIDINE GLUCONATE CLOTH 2 % EX PADS
6.0000 | MEDICATED_PAD | Freq: Every day | CUTANEOUS | Status: AC
  Administered 2013-01-16 – 2013-01-20 (×4): 6 via TOPICAL

## 2013-01-15 MED ORDER — ACETAMINOPHEN 325 MG PO TABS
650.0000 mg | ORAL_TABLET | Freq: Four times a day (QID) | ORAL | Status: DC | PRN
  Administered 2013-01-15 – 2013-01-20 (×6): 650 mg via ORAL
  Filled 2013-01-15 (×6): qty 2

## 2013-01-15 MED ORDER — INSULIN ASPART 100 UNIT/ML ~~LOC~~ SOLN
2.0000 [IU] | SUBCUTANEOUS | Status: DC
  Administered 2013-01-15: 2 [IU] via SUBCUTANEOUS
  Administered 2013-01-15: 6 [IU] via SUBCUTANEOUS
  Administered 2013-01-16 (×2): 2 [IU] via SUBCUTANEOUS
  Administered 2013-01-20: 4 [IU] via SUBCUTANEOUS
  Administered 2013-01-20: 2 [IU] via SUBCUTANEOUS

## 2013-01-15 MED ORDER — SODIUM CHLORIDE 0.9 % IV SOLN
1000.0000 mL | INTRAVENOUS | Status: DC
  Administered 2013-01-15: 1000 mL via INTRAVENOUS

## 2013-01-15 MED ORDER — MUPIROCIN 2 % EX OINT
1.0000 "application " | TOPICAL_OINTMENT | Freq: Two times a day (BID) | CUTANEOUS | Status: AC
  Administered 2013-01-15 – 2013-01-20 (×10): 1 via NASAL
  Filled 2013-01-15: qty 22

## 2013-01-15 NOTE — ED Notes (Signed)
RN x2 and EDP at the bedside.  Pt is alert and oriented and although hypotensive she denies any dizziness and is not diaphoretic.  Central line placement by EDP at this time

## 2013-01-15 NOTE — H&P (Signed)
PULMONARY  / CRITICAL CARE MEDICINE  Name: Peggy Espinoza MRN: [id number] DOB: 1929/05/17    ADMISSION DATE:  01/15/2013 CONSULTATION DATE:  10/20  REFERRING MD :  EDP-->MIller PRIMARY SERVICE: PCCM  CHIEF COMPLAINT:  Septic shock  BRIEF PATIENT DESCRIPTION:  24 YOF w/ 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 She has been followed by Dr Gwenyth Bouillon and Dr Mitzi Hansen. Underwent first chemo on 10/13 (Taxol & carboplatin) and her first round of XRT on 10/17.  Admitted on 10/20 w/ hypotension, dehydration, and AMS after several days of nausea, vomiting and diarrhea (first noted on 10/16).   SIGNIFICANT EVENTS / STUDIES:  NONE  LINES / TUBES: Right IJ CVL 10/20>>>  CULTURES: BCX2 10/20>>>> UC 10/20>>> c diff 10/20>>> Stool culture 10/20>>>  ANTIBIOTICS: azactam 10/20>>> vanc 10/20>>> Flagyl 10/20>>>  HISTORY OF PRESENT ILLNESS:   28 YOF w/ 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 She has been followed by Dr Gwenyth Bouillon and Dr Mitzi Hansen. Underwent first chemo on 10/13 (Taxol & carboplatin) and her first round of XRT on 10/17. Pt reported that she began to experience nausea on 10/16. She was able to attend XRT on 17th but symptoms continued to worsen. OVer the following days she continued to experience persistent nausea, episodic vomiting and frequent diarrhea. She presented to the ER at [facility] on 10/20 with worsening confusion. On presentation found to be hypotensive and diaphoretic w/ increased work of breathing. She was treated w/ IVF bolus. CXR showed her right lung mass w/ progression of what appeared to be possible post-obstructive PNA. Empiric antibiotics were started. She was admitted to the [facility] service for possible sepsis.   PAST MEDICAL HISTORY :  Past Medical History  Diagnosis Date  . HTN (hypertension)   . Hyperlipidemia   . Thyroid disease   . Diabetes mellitus   . Anxiety   . History of stress test 11/2012    [facility]   . Hypothyroidism   . Dementia     pt. states daughter got her started on Aricept   . GERD (gastroesophageal reflux disease)   . Arthritis     knees, legs, back  . Bladder incontinence     wears pad always   . Lung cancer    Past Surgical History  Procedure Laterality Date  . Arm fracture surgery    . Tonsillectomy    . Appendectomy    . Abdominal hysterectomy    . Lipoma removal    . Eye surgery    . Cholecystectomy    . Cataract extraction    . Fracture surgery      plate in R hand - long time ago  . Carpal tunnel release      R arm  . Video bronchoscopy N/A 12/15/2012    Procedure: VIDEO BRONCHOSCOPY;  Surgeon: Loreli Slot, MD;  Location: [facility] OR;  Service: Thoracic;  Laterality: N/A;   Prior to Admission medications   Medication Sig Start Date End Date Taking? Authorizing Provider  Cholecalciferol (VITAMIN D) 2000 UNITS tablet Take 2,000 Units by mouth daily.   Yes Historical Provider, MD  diazepam (VALIUM) 5 MG tablet Take 5 mg by mouth 4 (four) times daily as needed (dizziness).    Yes Historical Provider, MD  donepezil (ARICEPT) 5 MG tablet Take 5 mg by mouth at bedtime.    Yes Historical Provider, MD  emollient (BIAFINE)  cream Apply 1 application topically daily. Apply to chest after rad tx dialy 01/15/13  Yes Jonna Coup, MD  esomeprazole (NEXIUM) 40 MG capsule Take 40 mg by mouth daily before breakfast.     Yes Historical Provider, MD  ezetimibe (ZETIA) 10 MG tablet Take 10 mg by mouth daily.     Yes Historical Provider, MD  ferrous sulfate 325 (65 FE) MG tablet Take 325 mg by mouth 2 (two) times daily.   Yes Historical Provider, MD  fluticasone (FLONASE) 50 MCG/ACT nasal spray Place 2 sprays into the nose daily as needed for rhinitis.    Yes Historical Provider, MD  levothyroxine (SYNTHROID, LEVOTHROID) 88 MCG tablet Take 88 mcg by mouth daily before breakfast.    Yes Historical Provider, MD  metFORMIN (GLUCOPHAGE) 500 MG  tablet Take 500 mg by mouth 2 (two) times daily with a meal.     Yes Historical Provider, MD  Multiple Vitamin (MULTIVITAMIN) tablet Take 1 tablet by mouth daily.   Yes Historical Provider, MD  naproxen sodium (ANAPROX) 220 MG tablet Take 440 mg by mouth daily as needed (pain).   Yes Historical Provider, MD  olmesartan-hydrochlorothiazide (BENICAR HCT) 20-12.5 MG per tablet Take 1 tablet by mouth daily with breakfast.    Yes Historical Provider, MD  PRESCRIPTION MEDICATION Chemotherapy at [facility] (Dr. Arbutus Ped). Regimen: LUNG Carboplatin/Paclitaxel + XRT q 7d. Last chemotherapy treatment was 01/08/13.   Yes Historical Provider, MD  prochlorperazine (COMPAZINE) 10 MG tablet Take 1 tablet (10 mg total) by mouth every 6 (six) hours as needed. 12/29/12  Yes Si Gaul, MD  rosuvastatin (CRESTOR) 5 MG tablet Take 5 mg by mouth at bedtime.    Yes Historical Provider, MD   Allergies  Allergen Reactions  . Bactrim [Sulfamethoxazole-Trimethoprim] Hives  . Ciprofloxacin Hives  . Penicillins Swelling  . Welchol [Colesevelam Hcl] Other (See Comments)    Body aches  . Zocor [Simvastatin] Other (See Comments)    Muscle aches    FAMILY HISTORY:  Family History  Problem Relation Age of Onset  . Heart disease    . Arthritis    . Cancer    . Diabetes     SOCIAL HISTORY:  reports that she has quit smoking. Her smoking use included Cigarettes. She started smoking about 27 years ago. She has a 30 pack-year smoking history. She has never used smokeless tobacco. She reports that she does not drink alcohol or use illicit drugs.  REVIEW OF SYSTEMS:   MS altered, has difficulty w/ specific ROS questioning   SUBJECTIVE:  No distress  VITAL SIGNS: Temp:  [98.7 F (37.1 C)-102.3 F (39.1 C)] 99.1 F (37.3 C) (10/20 1139) Pulse Rate:  [110-128] 111 (10/20 1150) Resp:  [14-28] 22 (10/20 1150) BP: (73-104)/(46-75) 104/59 mmHg (10/20 1150) SpO2:  [88 %-100 %] 100 % (10/20 1139) HEMODYNAMICS: BP ok after  volume , no vasopressors   VENTILATOR SETTINGS: On nasal cannulae   INTAKE / OUTPUT: Intake/Output   None     PHYSICAL EXAMINATION: General:  Awake, oriented but anxious. Shivering  Neuro:  Awake, oriented but has trouble recalling specific dates.  HEENT:  MM dry, neck veins are flat Cardiovascular:  Tachy rrr  Lungs:  Crackles right base  Abdomen:  Soft, non-tender + bowel sounds  Musculoskeletal:  Intact  Skin:  Dry and intact   LABS:  CBC Recent Labs     01/15/13  0947  01/15/13  1018  WBC  15.7*   --   HGB  9.6*  10.5*  HCT  28.7*  31.0*  PLT  394   --    Coag's No results found for this basename: APTT, INR,  in the last 72 hours BMET Recent Labs     01/15/13  0947  01/15/13  1018  NA  127*  128*  K  3.4*  2.9*  CL  89*  91*  CO2  24   --   BUN  31*  30*  CREATININE  0.78  1.00  GLUCOSE  226*  221*   Electrolytes Recent Labs     01/15/13  0947  CALCIUM  10.4   Sepsis Markers Recent Labs     01/15/13  1015  PROCALCITON  0.33   ABG No results found for this basename: PHART, PCO2ART, PO2ART,  in the last 72 hours Liver Enzymes Recent Labs     01/15/13  0947  AST  27  ALT  13  ALKPHOS  92  BILITOT  0.9  ALBUMIN  2.9*   Cardiac Enzymes No results found for this basename: TROPONINI, PROBNP,  in the last 72 hours Glucose No results found for this basename: GLUCAP,  in the last 72 hours  Imaging Dg Chest Port 1 View  01/15/2013   CLINICAL DATA:  Status post central line placement  EXAM: PORTABLE CHEST - 1 VIEW  COMPARISON:  01/15/2013 1044 hr  FINDINGS: The right jugular line has now been placed. The catheter tip is noted in the rib mid right atrium. No pneumothorax is seen. A persistent right-sided lung mass is seen. No new focal abnormality is noted.  IMPRESSION: No evidence of pneumothorax following central line placement.   Electronically Signed   By: Alcide Clever M.D.   On: 01/15/2013 12:18   Dg Chest Port 1 View  01/15/2013    CLINICAL DATA:  Lung cancer. Altered mental status. Hypertension.  EXAM: PORTABLE CHEST - 1 VIEW  COMPARISON:  12/14/2012  FINDINGS: The right middle/upper lobe lung mass measures 7.8 cm craniocaudad, increased from 7.3 cm, although some of this increase may be due to the use of AP portable projection instead of the PA projection, which would tend to magnify the anteriorly located tumor compared to the prior exam.  Upper normal heart size. Stable left basilar scarring. Hiatal hernia noted. Tortuous thoracic aorta. There is some increase in bandlike opacity along the lateral margin of the right lung mass, favoring postobstructive atelectasis or pneumonitis.  Low lung volumes are present, causing crowding of the pulmonary vasculature.  IMPRESSION: 1. Increase in postobstructive atelectasis or pneumonitis associated with the mass involving the right middle lobe and right upper lobe. 2. Mass measures slightly greater craniocaudad than on the prior chest x-ray from last month, although some of this may be projectional (today's exam was in AP projection and last months exam was a PA projection) 3. Stable left basilar scarring. 4. Hiatal hernia. 5. Low lung volumes.   Electronically Signed   By: Herbie Baltimore M.D.   On: 01/15/2013 11:17     CXR: Right lung mass w/ progressive post-obstructive process   ASSESSMENT / PLAN:  PULMONARY A: NSCLC Right lung.  Post-obstructive pneumonia  >post -obstructive PNA vs ATX w/ possible XRT changes.  P:   Supplemental oxygen  See ID & heme section   CARDIOVASCULAR A:  Septic shock Hypovolemic shock Think that this is both hypovolemia and sepsis. Sources for potential infection include: post-obstructive PNA and gastroenteritis. Aspiration injury also a consideration in setting of  vomiting  P:  IVF bolus  Pressors as indicated See ID section  Low threshold for stress dose steroids  RENAL A:   Hyponatremia  Hypokalemia  P:   Replace K Send urine  osmo Send urine lytes   GASTROINTESTINAL A:   Nausea vomiting diarrhea.  Gastroenteritis: not clear if this is due to chemo or infectious  P:   Stool cultures C diff  PRN zofran   HEMATOLOGIC A:   NSCLC  Anemia w/ out bleeding  P:  Supportive care LMWH   INFECTIOUS A:   Post-obstructive PNA Gastro-enteritis  P:   See above   ENDOCRINE A:  Hypothyroidism DM w/ Hyperglycemia  P:   ssi   NEUROLOGIC A:   Acute septic coagulopathy  P:   Supportive care   TODAY'S SUMMARY:  Admit to ICU, septic protocol for PNA +/- gastro-enteritis. Will alert heme/onc to admit.   I have personally obtained a history, examined the patient, evaluated laboratory and imaging results, formulated the assessment and plan and placed orders. CRITICAL CARE: The patient is critically ill with multiple organ systems failure and requires high complexity decision making for assessment and support, frequent evaluation and titration of therapies, application of advanced monitoring technologies and extensive interpretation of multiple databases. Critical Care Time devoted to patient care services described in this note is 40 minutes.    Dorcas Carrow Pulmonary and Critical Care Medicine [facility] Pager: [phone]  01/15/2013, 12:22 PM

## 2013-01-15 NOTE — Progress Notes (Signed)
ANTIBIOTIC CONSULT NOTE - INITIAL  Pharmacy Consult for aztreonam, vancomycin Indication:  HCAP, sepsis  Allergies  Allergen Reactions  . Bactrim [Sulfamethoxazole-Trimethoprim] Hives  . Ciprofloxacin Hives  . Penicillins Swelling  . Welchol [Colesevelam Hcl] Other (See Comments)    Body aches  . Zocor [Simvastatin] Other (See Comments)    Muscle aches    Patient Measurements:   On 01/09/13: Wt 67.7 kg,  Ht 161 cm  Vital Signs: Temp: 99.1 F (37.3 C) (10/20 1139) Temp src: Oral (10/20 1139) BP: 104/59 mmHg (10/20 1150) Pulse Rate: 111 (10/20 1150) Intake/Output from previous day:   Intake/Output from this shift:    Labs:  Recent Labs  01/15/13 0947 01/15/13 1018  WBC 15.7*  --   HGB 9.6* 10.5*  PLT 394  --   CREATININE 0.78 1.00   The CrCl is unknown because both a height and weight (above a minimum accepted value) are required for this calculation. No results found for this basename: VANCOTROUGH, VANCOPEAK, VANCORANDOM, GENTTROUGH, GENTPEAK, GENTRANDOM, TOBRATROUGH, TOBRAPEAK, TOBRARND, AMIKACINPEAK, AMIKACINTROU, AMIKACIN,  in the last 72 hours   Microbiology: No results found for this or any previous visit (from the past 720 hour(s)).  Medical History: Past Medical History  Diagnosis Date  . HTN (hypertension)   . Hyperlipidemia   . Thyroid disease   . Diabetes mellitus   . Anxiety   . History of stress test 11/2012    [facility]   . Hypothyroidism   . Dementia     pt. states daughter got her started on Aricept   . GERD (gastroesophageal reflux disease)   . Arthritis     knees, legs, back  . Bladder incontinence     wears pad always   . Lung cancer     Medications:  Scheduled:   Infusions:  . sodium chloride Stopped (01/15/13 1142)  . aztreonam    . vancomycin 1,000 mg (01/15/13 1124)  . [DISCONTINUED] vancomycin     PRN:   Assessment: 79 y/o F with recently diagnosed stage IIIA NSCLC (squamous cell),  has started chemoradiation therapy (received first doses of paclitaxel/carboplatin on 10/13).  Was brought to ED 10/20 with weakness, altered mental status; admitted with HCAP and sepsis. Has aztreonam 2 grams and vancomycin 1 gram ordered in ED, further dosing per pharmacy.    Goal of Therapy:  Eradication of infection Aztreonam dosage adjusted for renal function Vancomycin trough 15-20  Plan:  1. Aztreonam 2 grams IV x1 (as ordered by MD), then 1 gram IV q8h. 2. Vancomycin 1 gram IV x 1 (as ordered by MD), then 500 mg IV q12h. 3. Follow serum creatinine, cultures, and clinical course. 4. Vancomycin trough at steady-state.  Elie Goody, PharmD, BCPS Pager: [phone] 01/15/2013  12:11 PM

## 2013-01-15 NOTE — ED Provider Notes (Signed)
CSN: [id number]     Arrival date & time 01/15/13  4098 History   First MD Initiated Contact with Patient 01/15/13 [phone]     Chief Complaint  Patient presents with  . Weakness  . Altered Mental Status   (Consider location/radiation/quality/duration/timing/severity/associated sxs/prior Treatment) HPI Comments: 79 year old female, history of recently diagnosed lung cancer, has just started chemotherapy this week, had her first intravenous dose on Monday and has been undergoing radiation over the course of the week. The family states that she has had some altered mental status today.  Level 5 caveat applies secondary to altered MS.  She presents today because of fever, nausea, persistent vomiting, altered mental status and increased phlegm production though they state there has not been much coughing.  The symptoms are persistent, severe, nothing seems to make it better or worse.   Patient is a 79 y.o. female presenting with weakness and altered mental status. The history is provided by the patient, medical records and a relative.  Weakness  Altered Mental Status Associated symptoms: weakness     Past Medical History  Diagnosis Date  . HTN (hypertension)   . Hyperlipidemia   . Thyroid disease   . Diabetes mellitus   . Anxiety   . History of stress test 11/2012    [facility]   . Hypothyroidism   . Dementia     pt. states daughter got her started on Aricept   . GERD (gastroesophageal reflux disease)   . Arthritis     knees, legs, back  . Bladder incontinence     wears pad always   . Lung cancer    Past Surgical History  Procedure Laterality Date  . Arm fracture surgery    . Tonsillectomy    . Appendectomy    . Abdominal hysterectomy    . Lipoma removal    . Eye surgery    . Cholecystectomy    . Cataract extraction    . Fracture surgery      plate in R hand - long time ago  . Carpal tunnel release      R arm  . Video bronchoscopy N/A  12/15/2012    Procedure: VIDEO BRONCHOSCOPY;  Surgeon: Loreli Slot, MD;  Location: [facility] OR;  Service: Thoracic;  Laterality: N/A;   Family History  Problem Relation Age of Onset  . Heart disease    . Arthritis    . Cancer    . Diabetes     History  Substance Use Topics  . Smoking status: Former Smoker -- 1.00 packs/day for 30 years    Types: Cigarettes    Start date: 03/29/1985  . Smokeless tobacco: Never Used  . Alcohol Use: No   OB History   Grav Para Term Preterm Abortions TAB SAB Ect Mult Living                 Review of Systems  Unable to perform ROS: Mental status change  Neurological: Positive for weakness.    Allergies  Bactrim; Ciprofloxacin; Penicillins; Welchol; and Zocor  Home Medications   Current Outpatient Rx  Name  Route  Sig  Dispense  Refill  . Cholecalciferol (VITAMIN D) 2000 UNITS tablet   Oral   Take 2,000 Units by mouth daily.         . diazepam (VALIUM) 5 MG tablet   Oral   Take 5 mg by mouth 4 (four) times daily as needed (dizziness).          [name]  donepezil (ARICEPT) 5 MG tablet   Oral   Take 5 mg by mouth at bedtime.          [name] emollient (BIAFINE) cream   Topical   Apply 1 application topically daily. Apply to chest after rad tx dialy         . esomeprazole (NEXIUM) 40 MG capsule   Oral   Take 40 mg by mouth daily before breakfast.           . ezetimibe (ZETIA) 10 MG tablet   Oral   Take 10 mg by mouth daily.           . ferrous sulfate 325 (65 FE) MG tablet   Oral   Take 325 mg by mouth 2 (two) times daily.         . fluticasone (FLONASE) 50 MCG/ACT nasal spray   Nasal   Place 2 sprays into the nose daily as needed for rhinitis.          [name] levothyroxine (SYNTHROID, LEVOTHROID) 88 MCG tablet   Oral   Take 88 mcg by mouth daily before breakfast.          . metFORMIN (GLUCOPHAGE) 500 MG tablet   Oral   Take 500 mg by mouth 2 (two) times daily with a meal.           . Multiple Vitamin  (MULTIVITAMIN) tablet   Oral   Take 1 tablet by mouth daily.         . naproxen sodium (ANAPROX) 220 MG tablet   Oral   Take 440 mg by mouth daily as needed (pain).         [name] olmesartan-hydrochlorothiazide (BENICAR HCT) 20-12.5 MG per tablet   Oral   Take 1 tablet by mouth daily with breakfast.          . PRESCRIPTION MEDICATION      Chemotherapy at [facility] (Dr. Arbutus Ped). Regimen: LUNG Carboplatin/Paclitaxel + XRT q 7d. Last chemotherapy treatment was 01/08/13.         [name] prochlorperazine (COMPAZINE) 10 MG tablet   Oral   Take 1 tablet (10 mg total) by mouth every 6 (six) hours as needed.   30 tablet   0   . rosuvastatin (CRESTOR) 5 MG tablet   Oral   Take 5 mg by mouth at bedtime.           BP 98/49  Pulse 110  Temp(Src) 98.7 F (37.1 C) (Oral)  Resp 23  SpO2 100% Physical Exam  Nursing note and vitals reviewed. Constitutional: She appears well-developed and well-nourished. She appears distressed.  HENT:  Head: Normocephalic and atraumatic.  Mouth/Throat: No oropharyngeal exudate.  Mucous membranes are dehydrated  Eyes: Conjunctivae and EOM are normal. Pupils are equal, round, and reactive to light. Right eye exhibits no discharge. Left eye exhibits no discharge. No scleral icterus.  Neck: Normal range of motion. Neck supple. No JVD present. No thyromegaly present.  Cardiovascular: Regular rhythm, normal heart sounds and intact distal pulses.  Exam reveals no gallop and no friction rub.   No murmur heard. Tachycardic to 120 beats per minute, no JVD, no peripheral edema, pulses are weak at the radial arteries but palpable  Pulmonary/Chest: Effort normal. She has no wheezes. She has rales.  Slight increased work of breathing with mild tachypnea, rales at the bases bilaterally, no increased work of breathing  Abdominal: Soft. Bowel sounds are normal. She exhibits no distension and no mass. There is no  tenderness.  Musculoskeletal: Normal range of motion. She  exhibits no edema and no tenderness.  Lymphadenopathy:    She has no cervical adenopathy.  Neurological: She is alert. Coordination normal.  Skin: Skin is warm and dry. No rash noted. No erythema.  Psychiatric: She has a normal mood and affect. Her behavior is normal.    ED Course  CENTRAL LINE Date/Time: 01/15/2013 11:33 AM Performed by: Eber Hong D Authorized by: Eber Hong D Consent: Verbal consent obtained. The procedure was performed in an emergent situation. Risks and benefits: risks, benefits and alternatives were discussed Consent given by: patient Patient understanding: patient states understanding of the procedure being performed Patient consent: the patient's understanding of the procedure matches consent given Required items: required blood products, implants, devices, and special equipment available Patient identity confirmed: verbally with patient and hospital-assigned identification number Time out: Immediately prior to procedure a "time out" was called to verify the correct patient, procedure, equipment, support staff and site/side marked as required. Indications: vascular access Anesthesia: local infiltration Local anesthetic: lidocaine 1% without epinephrine Anesthetic total: 2 ml Patient sedated: no Preparation: skin prepped with ChloraPrep Skin prep agent dried: skin prep agent completely dried prior to procedure Sterile barriers: all five maximum sterile barriers used - cap, mask, sterile gown, sterile gloves, and large sterile sheet Location details: right internal jugular Patient position: Trendelenburg Catheter type: triple lumen Pre-procedure: landmarks identified Ultrasound guidance: yes Number of attempts: 1 Successful placement: yes Post-procedure: line sutured Assessment: blood return through all ports, free fluid flow and placement verified by x-ray Patient tolerance: Patient tolerated the procedure well with no immediate complications.    (including critical care time) Labs Review Labs Reviewed  CBC WITH DIFFERENTIAL - Abnormal; Notable for the following:    WBC 15.7 (*)    RBC 3.38 (*)    Hemoglobin 9.6 (*)    HCT 28.7 (*)    Neutrophils Relative % 87 (*)    Neutro Abs 13.6 (*)    Lymphocytes Relative 6 (*)    Monocytes Absolute 1.2 (*)    All other components within normal limits  COMPREHENSIVE METABOLIC PANEL - Abnormal; Notable for the following:    Sodium 127 (*)    Potassium 3.4 (*)    Chloride 89 (*)    Glucose, Bld 226 (*)    BUN 31 (*)    Albumin 2.9 (*)    GFR calc non Af Amer 76 (*)    GFR calc Af Amer 88 (*)    All other components within normal limits  URINALYSIS, ROUTINE W REFLEX MICROSCOPIC - Abnormal; Notable for the following:    Color, Urine ORANGE (*)    APPearance CLOUDY (*)    Bilirubin Urine SMALL (*)    Protein, ur 30 (*)    Leukocytes, UA SMALL (*)    All other components within normal limits  URINE MICROSCOPIC-ADD ON - Abnormal; Notable for the following:    Bacteria, UA FEW (*)    All other components within normal limits  CG4 I-STAT (LACTIC ACID) - Abnormal; Notable for the following:    Lactic Acid, Venous 2.73 (*)    All other components within normal limits  POCT I-STAT, CHEM 8 - Abnormal; Notable for the following:    Sodium 128 (*)    Potassium 2.9 (*)    Chloride 91 (*)    BUN 30 (*)    Glucose, Bld 221 (*)    Hemoglobin 10.5 (*)    HCT 31.0 (*)  All other components within normal limits  CULTURE, BLOOD (ROUTINE X 2)  CULTURE, BLOOD (ROUTINE X 2)  URINE CULTURE  CLOSTRIDIUM DIFFICILE BY PCR  PROCALCITONIN   Imaging Review Dg Chest Port 1 View  01/15/2013   CLINICAL DATA:  Lung cancer. Altered mental status. Hypertension.  EXAM: PORTABLE CHEST - 1 VIEW  COMPARISON:  12/14/2012  FINDINGS: The right middle/upper lobe lung mass measures 7.8 cm craniocaudad, increased from 7.3 cm, although some of this increase may be due to the use of AP portable projection instead  of the PA projection, which would tend to magnify the anteriorly located tumor compared to the prior exam.  Upper normal heart size. Stable left basilar scarring. Hiatal hernia noted. Tortuous thoracic aorta. There is some increase in bandlike opacity along the lateral margin of the right lung mass, favoring postobstructive atelectasis or pneumonitis.  Low lung volumes are present, causing crowding of the pulmonary vasculature.  IMPRESSION: 1. Increase in postobstructive atelectasis or pneumonitis associated with the mass involving the right middle lobe and right upper lobe. 2. Mass measures slightly greater craniocaudad than on the prior chest x-ray from last month, although some of this may be projectional (today's exam was in AP projection and last months exam was a PA projection) 3. Stable left basilar scarring. 4. Hiatal hernia. 5. Low lung volumes.   Electronically Signed   By: Herbie Baltimore M.D.   On: 01/15/2013 11:17    EKG Interpretation   None       MDM   1. Septic shock   2. HCAP (healthcare-associated pneumonia)   3. Hyponatremia    At this time the patient is hypoxic, tachycardic, febrile and borderline hypotensive. I suspect that she is septic, early septic shock is a likely possibility. She is immunocompromised from her chemotherapy, will need IV fluids and antibiotics, x-ray pending to evaluate for pneumonia. The patient is critically ill and is requiring a high level of care, multiple re\re evaluations and will likely need a high level of care on admission to the hospital.  Code Sepsis activated after initial evaluation.  after multiple evaluations the patient became progressively more hypotensive requiring IV fluid bolus. She had difficult IV access in the periphery, 120-gauge IV was placed however due to the patient's shock like his status with her hypotension in this septic state a central access was required.  Central venous catheter was placed under ultrasound guidance,  critical care was consultative, broad-spectrum antibiotic ordered for sepsis.  Portable chest x-ray anteroposterior view, I personally seen and interpreted this x-ray finding to be a right-sided lung mass consistent with prior lung masses in addition to having what appears to be a post obstructive pneumonia infiltrate.  CRITICAL CARE Performed by: Vida Roller Total critical care time: 35 Critical care time was exclusive of separately billable procedures and treating other patients. Critical care was necessary to treat or prevent imminent or life-threatening deterioration. Critical care was time spent personally by me on the following activities: development of treatment plan with patient and/or surrogate as well as nursing, discussions with consultants, evaluation of patient's response to treatment, examination of patient, obtaining history from patient or surrogate, ordering and performing treatments and interventions, ordering and review of laboratory studies, ordering and review of radiographic studies, pulse oximetry and re-evaluation of patient's condition.  D/w Dr. Molli Knock who will see in ED for admission.       Vida Roller, MD 01/15/13 1136

## 2013-01-15 NOTE — Progress Notes (Signed)
UR completed 

## 2013-01-15 NOTE — ED Notes (Signed)
Pt's daughter reports pt diagnosed with lung cancer 1 month ago. Has been receiving chemo and radiation - had both chemo and radiation last Monday, and radiation last Tuesday-Friday. Daughter noticed yesterday that pt's mental status seemed off, pt could not remember her house alarm code. Pt has had decreased appetite, did not eat anything yesterday and has only drank a small amount of fluids. States pt is urinating, but daughter reports she may have blood in urine. Pt appears short of breath, saO2 88 on ra. Does not use oxygen at home. Tachycardic on monitor

## 2013-01-16 ENCOUNTER — Telehealth: Payer: Self-pay | Admitting: *Deleted

## 2013-01-16 ENCOUNTER — Inpatient Hospital Stay (HOSPITAL_COMMUNITY): Payer: Medicare Other

## 2013-01-16 ENCOUNTER — Ambulatory Visit: Payer: Medicare Other

## 2013-01-16 DIAGNOSIS — I959 Hypotension, unspecified: Secondary | ICD-10-CM

## 2013-01-16 DIAGNOSIS — E860 Dehydration: Secondary | ICD-10-CM

## 2013-01-16 DIAGNOSIS — C3410 Malignant neoplasm of upper lobe, unspecified bronchus or lung: Secondary | ICD-10-CM

## 2013-01-16 LAB — COMPREHENSIVE METABOLIC PANEL
ALT: 10 U/L (ref 0–35)
AST: 21 U/L (ref 0–37)
Alkaline Phosphatase: 73 U/L (ref 39–117)
BUN: 15 mg/dL (ref 6–23)
CO2: 24 mEq/L (ref 19–32)
Calcium: 9.3 mg/dL (ref 8.4–10.5)
GFR calc Af Amer: >90 mL/min (ref >90)
GFR calc non Af Amer: >90 mL/min (ref >90)
Glucose, Bld: 158 mg/dL — ABNORMAL HIGH (ref 70–99)
Potassium: 2.8 mEq/L — ABNORMAL LOW (ref 3.5–5.1)
Sodium: 132 mEq/L — ABNORMAL LOW (ref 135–145)
Total Bilirubin: 0.6 mg/dL (ref 0.3–1.2)

## 2013-01-16 LAB — CBC
HCT: 25.5 % — ABNORMAL LOW (ref 36.0–46.0)
Hemoglobin: 8.5 g/dL — ABNORMAL LOW (ref 12.0–15.0)
MCH: 28.6 pg (ref 26.0–34.0)
MCV: 85.9 fL (ref 78.0–100.0)
RBC: 2.97 MIL/uL — ABNORMAL LOW (ref 3.87–5.11)

## 2013-01-16 LAB — BASIC METABOLIC PANEL
Calcium: 9.8 mg/dL (ref 8.4–10.5)
Creatinine, Ser: 0.4 mg/dL — ABNORMAL LOW (ref 0.50–1.10)
GFR calc Af Amer: >90 mL/min (ref >90)
GFR calc non Af Amer: >90 mL/min (ref >90)
Glucose, Bld: 114 mg/dL — ABNORMAL HIGH (ref 70–99)
Potassium: 3.7 mEq/L (ref 3.5–5.1)

## 2013-01-16 LAB — URINE CULTURE: Culture: NO GROWTH

## 2013-01-16 LAB — BLOOD GAS, ARTERIAL
Drawn by: 11249
O2 Content: 4 L/min
pCO2 arterial: 42.2 mmHg (ref 35.0–45.0)
pH, Arterial: 7.345 — ABNORMAL LOW (ref 7.350–7.450)

## 2013-01-16 LAB — GLUCOSE, CAPILLARY
Glucose-Capillary: 140 mg/dL — ABNORMAL HIGH (ref 70–99)
Glucose-Capillary: 95 mg/dL (ref 70–99)
Glucose-Capillary: 110 mg/dL — ABNORMAL HIGH (ref 70–99)
Glucose-Capillary: 108 mg/dL — ABNORMAL HIGH (ref 70–99)

## 2013-01-16 LAB — LACTIC ACID, PLASMA: Lactic Acid, Venous: 0.6 mmol/L (ref 0.5–2.2)

## 2013-01-16 LAB — SODIUM, URINE, RANDOM: Sodium, Ur: 59 mEq/L

## 2013-01-16 LAB — OSMOLALITY, URINE: Osmolality, Ur: 363 mOsm/kg — ABNORMAL LOW (ref 390–1090)

## 2013-01-16 MED ORDER — POTASSIUM CHLORIDE 10 MEQ/50ML IV SOLN
10.0000 meq | INTRAVENOUS | Status: AC
  Administered 2013-01-16 (×8): 10 meq via INTRAVENOUS
  Filled 2013-01-16 (×2): qty 50

## 2013-01-16 MED ORDER — FAMOTIDINE IN NACL 20-0.9 MG/50ML-% IV SOLN
20.0000 mg | Freq: Two times a day (BID) | INTRAVENOUS | Status: DC
  Administered 2013-01-16 – 2013-01-17 (×3): 20 mg via INTRAVENOUS
  Filled 2013-01-16 (×4): qty 50

## 2013-01-16 MED ORDER — POTASSIUM CHLORIDE CRYS ER 20 MEQ PO TBCR
40.0000 meq | EXTENDED_RELEASE_TABLET | Freq: Once | ORAL | Status: AC
  Administered 2013-01-16: 40 meq via ORAL
  Filled 2013-01-16: qty 2

## 2013-01-16 MED ORDER — IOHEXOL 300 MG/ML  SOLN
25.0000 mL | INTRAMUSCULAR | Status: AC
  Administered 2013-01-16 (×2): 25 mL via ORAL

## 2013-01-16 NOTE — Progress Notes (Signed)
[facility] ADULT ICU REPLACEMENT PROTOCOL FOR AM LAB REPLACEMENT ONLY  The patient does apply for the [facility] Adult ICU Electrolyte Replacment Protocol based on the criteria listed below:   1. Is GFR >/= 40 ml/min? yes  Patient's GFR today is >90 2. Is urine output >/= 0.5 ml/kg/hr for the last 6 hours? yes Patient's UOP is 1.6 ml/kg/hr 3. Is BUN < 60 mg/dL? yes  Patient's BUN today is 15 4. Abnormal electrolyte(s): K+ 2.8 5. Ordered repletion with:protocol 6. If a panic level lab has been reported, has the CCM MD in charge been notified? yes.   Physician:  Wyline Mood [facility] 01/16/2013 6:42 AM

## 2013-01-16 NOTE — Telephone Encounter (Signed)
Called  For RN to room 1226 [facility], spoke with Ines Bloomer RN, he doubts patient will come for radiation therapy today, she is still on pressors", and Dr.Mohamed came by this am and saw the patient, ', will call us later if she can be treated with radiation ,informed nurse that we moved her time to 2pm, ,gave him our nursing station number [phone] Thanked him for update 8:59 AM

## 2013-01-16 NOTE — Telephone Encounter (Signed)
Called Dr.Mohamed's nurse phone, left message .,patient was admitted Monday, room 1226, ,n,ausea,vomiting, diarrhea, tachycardia,sob, will inform Dr.Moody, possible sepsi per Md note 01/15/13 8:22 AM

## 2013-01-16 NOTE — Progress Notes (Signed)
INITIAL NUTRITION ASSESSMENT  DOCUMENTATION CODES Per approved criteria  -Not Applicable   INTERVENTION: Diet advancement per MD discretion Provide Boost Plus BID when diet advanced  NUTRITION DIAGNOSIS: Inadequate oral intake related to nausea and decreased appetite as evidenced by pt's report of inability to eat for past 5 days.   Goal: Pt to meet >/= 90% of their estimated nutrition needs   Monitor:  Diet advancement/PO intake Weight trend Labs  Reason for Assessment: Malnutrition Screening Tool score of 3  79 y.o. 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 Underwent first chemo on 10/13 (Taxol & carboplatin) and her first round of XRT on 10/17. Admitted on 10/20 w/ hypotension, dehydration, and AMS after several days of nausea, vomiting and diarrhea (first noted on 10/16).   Pt reports inability to keep food or drinks down since 10/16 due to nausea and vomiting. Pt states that before then she was eating 3 times daily but, was eating less than usual. Pt reports her usual body weight is 173 lbs. She was drinking Ensure supplements for a little while but got tired of them. Weight history shows pt weighed 157 lbs for over one year but then in July 2014, pt started to lose weight (down to 146 lbs 12/12/12.   Height: Ht Readings from Last 1 Encounters:  01/15/13 5\' 4"  (1.626 m)    Weight: Wt Readings from Last 1 Encounters:  01/16/13 161 lb 13.1 oz (73.4 kg)    Ideal Body Weight: 120 lbs  % Ideal Body Weight: 134%  Wt Readings from Last 10 Encounters:  01/16/13 161 lb 13.1 oz (73.4 kg)  01/12/13 149 lb 4.8 oz (67.722 kg)  01/09/13 157 lb (71.215 kg)  12/28/12 153 lb 6.4 oz (69.582 kg)  12/20/12 152 lb (68.947 kg)  12/14/12 152 lb (68.947 kg)  12/12/12 146 lb (66.225 kg)  11/09/12 149 lb 1.6 oz (67.631 kg)  11/07/12 150 lb (68.04 kg)  10/26/12 157 lb (71.215  kg)  Admission weight 154 lbs  Usual Body Weight: 157 lbs  % Usual Body Weight: 103%  BMI:  Body mass index is 27.76 kg/(m^2).  Estimated Nutritional Needs: Kcal: 1600-1800 Protein: 90-100 grams Fluid: 1.8-2 L.day  Skin: WDL  Diet Order: NPO  EDUCATION NEEDS: -No education needs identified at this time   Intake/Output Summary (Last 24 hours) at 01/16/13 1345 Last data filed at 01/16/13 0700  Gross per 24 hour  Intake 6545.1 ml  Output   1525 ml  Net 5020.1 ml    Last BM: 10/20  Labs:   Recent Labs Lab 01/15/13 0947 01/15/13 1018 01/16/13 0440  NA 127* 128* 132*  K 3.4* 2.9* 2.8*  CL 89* 91* 101  CO2 24  --  24  BUN 31* 30* 15  CREATININE 0.78 1.00 0.46*  CALCIUM 10.4  --  9.3  GLUCOSE 226* 221* 158*    CBG (last 3)   Recent Labs  01/16/13 0347 01/16/13 0748 01/16/13 1209  GLUCAP 140* 95 110*    Scheduled Meds: . aztreonam  1 g Intravenous Q8H  . Chlorhexidine Gluconate Cloth  6 each Topical Q0600  . enoxaparin (LOVENOX) injection  40 mg Subcutaneous Q24H  . famotidine (PEPCID) IV  20 mg Intravenous Q12H  . insulin aspart  2-6 Units Subcutaneous Q4H  . levothyroxine  88 mcg Oral QAC breakfast  . mupirocin ointment  1 application Nasal BID  .  potassium chloride  10 mEq Intravenous Q1H  . vancomycin  500 mg Intravenous Q12H    Continuous Infusions: . sodium chloride 100 mL/hr at 01/16/13 0914  . norepinephrine (LEVOPHED) Adult infusion 5 mcg/min (01/16/13 0830)    Past Medical History  Diagnosis Date  . HTN (hypertension)   . Hyperlipidemia   . Thyroid disease   . Diabetes mellitus   . Anxiety   . History of stress test 11/2012    [facility]   . Hypothyroidism   . Dementia     pt. states daughter got her started on Aricept   . GERD (gastroesophageal reflux disease)   . Arthritis     knees, legs, back  . Bladder incontinence     wears pad always   . Lung cancer     Past Surgical History   Procedure Laterality Date  . Arm fracture surgery    . Tonsillectomy    . Appendectomy    . Abdominal hysterectomy    . Lipoma removal    . Eye surgery    . Cholecystectomy    . Cataract extraction    . Fracture surgery      plate in R hand - long time ago  . Carpal tunnel release      R arm  . Video bronchoscopy N/A 12/15/2012    Procedure: VIDEO BRONCHOSCOPY;  Surgeon: Loreli Slot, MD;  Location: [facility] OR;  Service: Thoracic;  Laterality: N/A;    Ian Malkin RD, LDN Inpatient Clinical Dietitian Pager: [phone] After Hours Pager: [phone]

## 2013-01-16 NOTE — Progress Notes (Signed)
PULMONARY  / CRITICAL CARE MEDICINE  Name: Peggy Espinoza MRN: [id number] DOB: [birth year]    ADMISSION DATE:  01/15/2013 CONSULTATION DATE:  10/20  REFERRING MD :  EDP-->MIller PRIMARY SERVICE: PCCM  CHIEF COMPLAINT:  Septic shock  BRIEF PATIENT DESCRIPTION:  50 YOF w/ 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 She has been followed by Dr Gwenyth Bouillon and Dr Mitzi Hansen. Underwent first chemo on 10/13 (Taxol & carboplatin) and her first round of XRT on 10/17.  Admitted on 10/20 w/ hypotension, dehydration, and AMS after several days of nausea, vomiting and diarrhea (first noted on 10/16).   SIGNIFICANT EVENTS / STUDIES:  CTS abd pelvis 10/21>>>  LINES / TUBES: Right IJ CVL 10/20>>>  CULTURES: BCX2 10/20>>>> UC 10/20>>> c diff 10/20>>>neg  Stool culture 10/20>>>  ANTIBIOTICS: azactam 10/20>>> vanc 10/20>>> Flagyl 10/20>>>   SUBJECTIVE:  No distress, feels better   VITAL SIGNS: Temp:  [97.6 F (36.4 C)-102.3 F (39.1 C)] 97.9 F (36.6 C) (10/21 0100) Pulse Rate:  [88-128] 94 (10/21 0700) Resp:  [14-29] 19 (10/21 0700) BP: (72-130)/(27-86) 95/55 mmHg (10/21 0700) SpO2:  [86 %-100 %] 100 % (10/21 0700) FiO2 (%):  [35 %] 35 % (10/21 0045) Weight:  [69.9 kg (154 lb 1.6 oz)-73.4 kg (161 lb 13.1 oz)] 73.4 kg (161 lb 13.1 oz) (10/21 0534) 4 liters  HEMODYNAMICS: On levophed  CVP:  [5 mmHg-12 mmHg] 11 mmHg VENTILATOR SETTINGS: On nasal cannula Vent Mode:  [-]  FiO2 (%):  [35 %] 35 % INTAKE / OUTPUT: Intake/Output     10/20 0701 - 10/21 0700 10/21 0701 - 10/22 0700   I.V. (mL/kg) 3345.1 (45.6)    IV Piggyback 3200    Total Intake(mL/kg) 6545.1 (89.2)    Urine (mL/kg/hr) 1675    Total Output 1675     Net +4870.1          Urine Occurrence 2 x    Stool Occurrence 2 x      PHYSICAL EXAMINATION: General:  Awake, oriented, less anxious  Neuro:  Awake, oriented  HEENT:  MM dry, neck veins are flat Cardiovascular:  Tachy rrr  Lungs:   Crackles right base  Abdomen:  Soft, non-tender + bowel sounds  Musculoskeletal:  Intact  Skin:  Dry and intact   LABS:  CBC Recent Labs     01/15/13  0947  01/15/13  1018  01/16/13  0440  WBC  15.7*   --   13.8*  HGB  9.6*  10.5*  8.5*  HCT  28.7*  31.0*  25.5*  PLT  394   --   335   Coag's Recent Labs     01/15/13  1005  APTT  33  INR  1.19   BMET Recent Labs     01/15/13  0947  01/15/13  1018  01/16/13  0440  NA  127*  128*  132*  K  3.4*  2.9*  2.8*  CL  89*  91*  101  CO2  24   --   24  BUN  31*  30*  15  CREATININE  0.78  1.00  0.46*  GLUCOSE  226*  221*  158*   Electrolytes Recent Labs     01/15/13  0947  01/16/13  0440  CALCIUM  10.4  9.3   Sepsis Markers Recent Labs     01/15/13  1015  01/16/13  0440  PROCALCITON  0.33  0.25  ABG Recent Labs     01/16/13  0515  PHART  7.345*  PCO2ART  42.2  PO2ART  113.0*   Liver Enzymes Recent Labs     01/15/13  0947  01/16/13  0440  AST  27  21  ALT  13  10  ALKPHOS  92  73  BILITOT  0.9  0.6  ALBUMIN  2.9*  2.1*   Cardiac Enzymes Recent Labs     01/15/13  1300  TROPONINI  <0.30   Glucose Recent Labs     01/15/13  1408  01/15/13  1531  01/15/13  2004  01/15/13  2346  01/16/13  0347  01/16/13  0748  GLUCAP  211*  150*  118*  128*  140*  95    Imaging Dg Chest Port 1 View  01/16/2013   CLINICAL DATA:  Pneumonia  EXAM: PORTABLE CHEST - 1 VIEW  COMPARISON:  01/15/2013  FINDINGS: Heart is normal size. Right internal jugular central venous catheter remains in the right atrium, unchanged. Continued rounded airspace opacity in the right lower lung, unchanged. Minimal left base atelectasis. Heart is normal size.  IMPRESSION: No significant change.   Electronically Signed   By: Charlett Nose M.D.   On: 01/16/2013 07:13   Dg Chest Port 1 View  01/15/2013   CLINICAL DATA:  Status post central line placement  EXAM: PORTABLE CHEST - 1 VIEW  COMPARISON:  01/15/2013 1044 hr  FINDINGS: The  right jugular line has now been placed. The catheter tip is noted in the rib mid right atrium. No pneumothorax is seen. A persistent right-sided lung mass is seen. No new focal abnormality is noted.  IMPRESSION: No evidence of pneumothorax following central line placement.   Electronically Signed   By: Alcide Clever M.D.   On: 01/15/2013 12:18   Dg Chest Port 1 View  01/15/2013   CLINICAL DATA:  Lung cancer. Altered mental status. Hypertension.  EXAM: PORTABLE CHEST - 1 VIEW  COMPARISON:  12/14/2012  FINDINGS: The right middle/upper lobe lung mass measures 7.8 cm craniocaudad, increased from 7.3 cm, although some of this increase may be due to the use of AP portable projection instead of the PA projection, which would tend to magnify the anteriorly located tumor compared to the prior exam.  Upper normal heart size. Stable left basilar scarring. Hiatal hernia noted. Tortuous thoracic aorta. There is some increase in bandlike opacity along the lateral margin of the right lung mass, favoring postobstructive atelectasis or pneumonitis.  Low lung volumes are present, causing crowding of the pulmonary vasculature.  IMPRESSION: 1. Increase in postobstructive atelectasis or pneumonitis associated with the mass involving the right middle lobe and right upper lobe. 2. Mass measures slightly greater craniocaudad than on the prior chest x-ray from last month, although some of this may be projectional (today's exam was in AP projection and last months exam was a PA projection) 3. Stable left basilar scarring. 4. Hiatal hernia. 5. Low lung volumes.   Electronically Signed   By: Herbie Baltimore M.D.   On: 01/15/2013 11:17       ASSESSMENT / PLAN:  PULMONARY A: NSCLC Right lung.  Post-obstructive pneumonia  >post -obstructive PNA vs ATX w/ possible XRT changes. Favor PNA P:   Supplemental oxygen  See ID & heme section   CARDIOVASCULAR A:  Septic shock Hypovolemic shock Think that this is both hypovolemia  and sepsis. Sources for potential infection include: post-obstructive PNA and gastroenteritis. Aspiration injury also  a consideration in setting of vomiting. Favoring post-obstructive PNA P:  Pressors as indicated-->wean for SBP >100 See ID section  Low threshold for stress dose steroids  RENAL A:   Hyponatremia: volume related and improved  Hypokalemia  P:   Replace K  GASTROINTESTINAL A:   Nausea vomiting diarrhea.  Gastroenteritis: not clear if this is due to chemo or infectious  P:   Stool cultures pending PRN zofran  CT abd/pelvis   HEMATOLOGIC A:   NSCLC  Anemia w/ out bleeding (element of dilution) P:  Supportive care LMWH   INFECTIOUS A:   Post-obstructive PNA Gastro-enteritis: does not seem infectious  P:   See above   ENDOCRINE A:  Hypothyroidism DM w/ Hyperglycemia  P:   ssi   NEUROLOGIC A:   Acute septic coagulopathy  P:   Supportive care    I have personally obtained a history, examined the patient, evaluated laboratory and imaging results, formulated the assessment and plan and placed orders. CRITICAL CARE: The patient is critically ill with multiple organ systems failure and requires high complexity decision making for assessment and support, frequent evaluation and titration of therapies, application of advanced monitoring technologies and extensive interpretation of multiple databases. Critical Care Time devoted to patient care services described in this note is 30 minutes.    Dorcas Carrow Beeper  [phone]  Cell  [phone]  If no response or cell goes to voicemail, call beeper [phone]  01/16/2013, 8:43 AM

## 2013-01-16 NOTE — Progress Notes (Signed)
Subjective: The patient is seen and examined today. She is a very pleasant 79 years old white female with recently diagnosed with a stage IIIA non-small cell lung cancer, squamous cell carcinoma and was started on treatment with concurrent chemoradiation with carboplatin and paclitaxel is status post 1 cycle. The patient had significant nausea and vomiting as well as diarrhea after the first cycle of her treatment. She completed one week of radiotherapy. She had poor by mouth intake and was dehydrated. She was admitted to [facility] yesterday with hypotension and dehydration as well as acute mental status change. She was started on IV hydration and feeling much better today. She is not sure if she had any recent viral gastroenteritis or not. She denied having any fever or chills today.  Objective: Vital signs in last 24 hours: Temp:  [97.6 F (36.4 C)-102.3 F (39.1 C)] 97.9 F (36.6 C) (10/21 0100) Pulse Rate:  [88-128] 94 (10/21 0700) Resp:  [14-29] 19 (10/21 0700) BP: (72-130)/(27-86) 95/55 mmHg (10/21 0700) SpO2:  [86 %-100 %] 100 % (10/21 0700) FiO2 (%):  [35 %] 35 % (10/21 0045) Weight:  [154 lb 1.6 oz (69.9 kg)-161 lb 13.1 oz (73.4 kg)] 161 lb 13.1 oz (73.4 kg) (10/21 0534)  Intake/Output from previous day: 10/20 0701 - 10/21 0700 In: 6545.1 [I.V.:3345.1; IV Piggyback:3200] Out: 1675 [Urine:1675] Intake/Output this shift:    General appearance: alert, cooperative, fatigued and mild distress Resp: clear to auscultation bilaterally Cardio: regular rate and rhythm, S1, S2 normal, no murmur, click, rub or gallop GI: soft, non-tender; bowel sounds normal; no masses,  no organomegaly Extremities: extremities normal, atraumatic, no cyanosis or edema  Lab Results:   Recent Labs  01/15/13 0947 01/15/13 1018 01/16/13 0440  WBC 15.7*  --  13.8*  HGB 9.6* 10.5* 8.5*  HCT 28.7* 31.0* 25.5*  PLT 394  --  335   BMET  Recent Labs  01/15/13 0947 01/15/13 1018  01/16/13 0440  NA 127* 128* 132*  K 3.4* 2.9* 2.8*  CL 89* 91* 101  CO2 24  --  24  GLUCOSE 226* 221* 158*  BUN 31* 30* 15  CREATININE 0.78 1.00 0.46*  CALCIUM 10.4  --  9.3    Studies/Results: Dg Chest Port 1 View  01/16/2013   CLINICAL DATA:  Pneumonia  EXAM: PORTABLE CHEST - 1 VIEW  COMPARISON:  01/15/2013  FINDINGS: Heart is normal size. Right internal jugular central venous catheter remains in the right atrium, unchanged. Continued rounded airspace opacity in the right lower lung, unchanged. Minimal left base atelectasis. Heart is normal size.  IMPRESSION: No significant change.   Electronically Signed   By: Charlett Nose M.D.   On: 01/16/2013 07:13   Dg Chest Port 1 View  01/15/2013   CLINICAL DATA:  Status post central line placement  EXAM: PORTABLE CHEST - 1 VIEW  COMPARISON:  01/15/2013 1044 hr  FINDINGS: The right jugular line has now been placed. The catheter tip is noted in the rib mid right atrium. No pneumothorax is seen. A persistent right-sided lung mass is seen. No new focal abnormality is noted.  IMPRESSION: No evidence of pneumothorax following central line placement.   Electronically Signed   By: Alcide Clever M.D.   On: 01/15/2013 12:18   Dg Chest Port 1 View  01/15/2013   CLINICAL DATA:  Lung cancer. Altered mental status. Hypertension.  EXAM: PORTABLE CHEST - 1 VIEW  COMPARISON:  12/14/2012  FINDINGS: The right middle/upper lobe lung mass  measures 7.8 cm craniocaudad, increased from 7.3 cm, although some of this increase may be due to the use of AP portable projection instead of the PA projection, which would tend to magnify the anteriorly located tumor compared to the prior exam.  Upper normal heart size. Stable left basilar scarring. Hiatal hernia noted. Tortuous thoracic aorta. There is some increase in bandlike opacity along the lateral margin of the right lung mass, favoring postobstructive atelectasis or pneumonitis.  Low lung volumes are present, causing crowding of  the pulmonary vasculature.  IMPRESSION: 1. Increase in postobstructive atelectasis or pneumonitis associated with the mass involving the right middle lobe and right upper lobe. 2. Mass measures slightly greater craniocaudad than on the prior chest x-ray from last month, although some of this may be projectional (today's exam was in AP projection and last months exam was a PA projection) 3. Stable left basilar scarring. 4. Hiatal hernia. 5. Low lung volumes.   Electronically Signed   By: Herbie Baltimore M.D.   On: 01/15/2013 11:17    Medications: I have reviewed the patient's current medications.  Assessment/Plan: This is a very pleasant 79 years old white female with recently diagnosed with a stage IIIa non-small cell lung cancer, squamous cell carcinoma status post 1 cycle of concurrent chemoradiation with reduced dose carboplatin and paclitaxel. The patient was admitted with dehydration, hypotension and septic shock. She is feeling much better today after receiving IV hydration. Continue current supportive care. I will arrange for the patient a follow up appointment with me after her discharge for more detailed discussion about her treatment options. Thank you for taking good care of her off Ms. Andrew Au. I will continue to follow the patient with you and assist in her management on as-needed basis.  LOS: 1 day    Sheray Grist K. 01/16/2013

## 2013-01-17 ENCOUNTER — Telehealth: Payer: Self-pay | Admitting: *Deleted

## 2013-01-17 ENCOUNTER — Ambulatory Visit: Payer: Medicare Other

## 2013-01-17 DIAGNOSIS — M7989 Other specified soft tissue disorders: Secondary | ICD-10-CM

## 2013-01-17 DIAGNOSIS — J189 Pneumonia, unspecified organism: Secondary | ICD-10-CM

## 2013-01-17 DIAGNOSIS — M79609 Pain in unspecified limb: Secondary | ICD-10-CM

## 2013-01-17 DIAGNOSIS — D649 Anemia, unspecified: Secondary | ICD-10-CM

## 2013-01-17 DIAGNOSIS — R110 Nausea: Secondary | ICD-10-CM

## 2013-01-17 LAB — GLUCOSE, CAPILLARY
Glucose-Capillary: 103 mg/dL — ABNORMAL HIGH (ref 70–99)
Glucose-Capillary: 104 mg/dL — ABNORMAL HIGH (ref 70–99)
Glucose-Capillary: 104 mg/dL — ABNORMAL HIGH (ref 70–99)
Glucose-Capillary: 87 mg/dL (ref 70–99)
Glucose-Capillary: 94 mg/dL (ref 70–99)
Glucose-Capillary: 94 mg/dL (ref 70–99)

## 2013-01-17 LAB — COMPREHENSIVE METABOLIC PANEL
ALT: 15 U/L (ref 0–35)
AST: 24 U/L (ref 0–37)
Albumin: 2.1 g/dL — ABNORMAL LOW (ref 3.5–5.2)
CO2: 22 mEq/L (ref 19–32)
Calcium: 10.4 mg/dL (ref 8.4–10.5)
Creatinine, Ser: 0.42 mg/dL — ABNORMAL LOW (ref 0.50–1.10)
GFR calc non Af Amer: >90 mL/min (ref >90)
Sodium: 130 mEq/L — ABNORMAL LOW (ref 135–145)
Total Bilirubin: 0.4 mg/dL (ref 0.3–1.2)
Total Protein: 5.8 g/dL — ABNORMAL LOW (ref 6.0–8.3)

## 2013-01-17 LAB — PROCALCITONIN: Procalcitonin: 0.13 ng/mL

## 2013-01-17 LAB — URINE CULTURE
Colony Count: NO GROWTH
Culture: NO GROWTH

## 2013-01-17 LAB — CBC
MCH: 28.1 pg (ref 26.0–34.0)
MCV: 85.4 fL (ref 78.0–100.0)
Platelets: 326 10*3/uL (ref 150–400)
RDW: 13.7 % (ref 11.5–15.5)

## 2013-01-17 MED ORDER — ONDANSETRON 8 MG/NS 50 ML IVPB
8.0000 mg | Freq: Three times a day (TID) | INTRAVENOUS | Status: DC | PRN
  Administered 2013-01-17 – 2013-01-21 (×3): 8 mg via INTRAVENOUS
  Filled 2013-01-17 (×3): qty 8

## 2013-01-17 MED ORDER — FAMOTIDINE 20 MG PO TABS
20.0000 mg | ORAL_TABLET | Freq: Every day | ORAL | Status: DC
  Administered 2013-01-17 – 2013-01-21 (×5): 20 mg via ORAL
  Filled 2013-01-17 (×7): qty 1

## 2013-01-17 MED ORDER — VANCOMYCIN HCL IN DEXTROSE 750-5 MG/150ML-% IV SOLN
750.0000 mg | Freq: Two times a day (BID) | INTRAVENOUS | Status: DC
  Administered 2013-01-18 – 2013-01-20 (×5): 750 mg via INTRAVENOUS
  Filled 2013-01-17 (×7): qty 150

## 2013-01-17 MED ORDER — POTASSIUM CHLORIDE CRYS ER 20 MEQ PO TBCR
40.0000 meq | EXTENDED_RELEASE_TABLET | Freq: Once | ORAL | Status: AC
  Administered 2013-01-17: 40 meq via ORAL
  Filled 2013-01-17: qty 2

## 2013-01-17 NOTE — Progress Notes (Signed)
*  PRELIMINARY RESULTS* Vascular Ultrasound Lower extremity venous duplex has been completed.  Preliminary findings: no evidence of DVT.  Large baker's cyst noted bilaterally.    Farrel Demark, RDMS, RVT  01/17/2013, 12:27 PM

## 2013-01-17 NOTE — Progress Notes (Signed)
Rx Brief Abx note:  Vancomcyin  Assessement:   Wt=73 CrCl~50 (N) (SCr= 0.4)  VT=5.5 mg/L after  500mg  IV q12h  Plan:  Increase Vancomycin to 750mg  IV q12h  F/U SCr/levels/cultures  Lorenza Evangelist 01/17/2013 10:28 PM  .

## 2013-01-17 NOTE — Progress Notes (Signed)
PULMONARY  / CRITICAL CARE MEDICINE  Name: Peggy Espinoza MRN: [id number] DOB: 1930-01-19    ADMISSION DATE:  01/15/2013 CONSULTATION DATE:  10/20  REFERRING MD :  EDP-->MIller PRIMARY SERVICE: PCCM  CHIEF COMPLAINT:  Septic shock  BRIEF PATIENT DESCRIPTION:  55 YOF w/ 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 She has been followed by Dr Gwenyth Bouillon and Dr Mitzi Hansen. Underwent first chemo on 10/13 (Taxol & carboplatin) and her first round of XRT on 10/17.  Admitted on 10/20 w/ hypotension, dehydration, and AMS after several days of nausea, vomiting and diarrhea (first noted on 10/16).   SIGNIFICANT EVENTS / STUDIES:  CTS abd pelvis 10/21>>>neg acute  LE [place] 10/22>>>  LINES / TUBES: Right IJ CVL 10/20>>>  CULTURES: BCX2 10/20>>>> UC 10/20>>>neg  c diff 10/20>>>neg  Stool culture 10/20>>>  ANTIBIOTICS: azactam 10/20>>> vanc 10/20>>> Flagyl 10/20>>>10/22   SUBJECTIVE:  No distress, feels better   VITAL SIGNS: Temp:  [97.6 F (36.4 C)-98.6 F (37 C)] 97.6 F (36.4 C) (10/22 0800) Pulse Rate:  [35-125] 112 (10/22 1000) Resp:  [18-27] 20 (10/22 1000) BP: (95-161)/(46-89) 125/76 mmHg (10/22 1000) SpO2:  [86 %-99 %] 93 % (10/22 1000) Weight:  [73.5 kg (162 lb 0.6 oz)] 73.5 kg (162 lb 0.6 oz) (10/22 0500) 4 liters  HEMODYNAMICS: Off pressors   CVP:  [6 mmHg-11 mmHg] 11 mmHg VENTILATOR SETTINGS: On nasal cannula   INTAKE / OUTPUT: Intake/Output     10/21 0701 - 10/22 0700 10/22 0701 - 10/23 0700   I.V. (mL/kg) 2239.8 (30.5) 700 (9.5)   IV Piggyback 300    Total Intake(mL/kg) 2539.8 (34.6) 700 (9.5)   Urine (mL/kg/hr) 450 (0.3)    Total Output 450     Net +2089.8 +700        Urine Occurrence 3 x    Stool Occurrence 3 x      PHYSICAL EXAMINATION: General:  Awake, oriented, less anxious  Neuro:  Awake, oriented  HEENT:  MM dry, neck veins are flat Cardiovascular:  Tachy rrr  Lungs:  Crackles right base  Abdomen:  Soft, non-tender +  bowel sounds  Musculoskeletal:  Intact  Skin:  Dry and intact LE edema and pain   LABS:  CBC Recent Labs     01/15/13  0947  01/15/13  1018  01/16/13  0440  01/17/13  0550  WBC  15.7*   --   13.8*  11.6*  HGB  9.6*  10.5*  8.5*  8.1*  HCT  28.7*  31.0*  25.5*  24.6*  PLT  394   --   335  326   Coag's Recent Labs     01/15/13  1005  APTT  33  INR  1.19   BMET Recent Labs     01/16/13  0440  01/16/13  2105  01/17/13  0550  NA  132*  128*  130*  K  2.8*  3.7  3.4*  CL  101  99  99  CO2  24  20  22   BUN  15  11  9   CREATININE  0.46*  0.40*  0.42*  GLUCOSE  158*  114*  112*   Electrolytes Recent Labs     01/16/13  0440  01/16/13  2105  01/17/13  0550  CALCIUM  9.3  9.8  10.4   Sepsis Markers Recent Labs     01/15/13  1015  01/16/13  0440  01/17/13  0550  PROCALCITON  0.33  0.25  0.13   ABG Recent Labs     01/16/13  0515  PHART  7.345*  PCO2ART  42.2  PO2ART  113.0*   Liver Enzymes Recent Labs     01/15/13  0947  01/16/13  0440  01/17/13  0550  AST  27  21  24   ALT  13  10  15   ALKPHOS  92  73  90  BILITOT  0.9  0.6  0.4  ALBUMIN  2.9*  2.1*  2.1*   Cardiac Enzymes Recent Labs     01/15/13  1300  TROPONINI  <0.30   Glucose Recent Labs     01/16/13  1209  01/16/13  1555  01/16/13  2039  01/16/13  2359  01/17/13  0358  01/17/13  0725  GLUCAP  110*  108*  103*  104*  105*  104*    Imaging Ct Abdomen Pelvis Wo Contrast  01/16/2013   CLINICAL DATA:  Abdominal sepsis.  Right lung mass.  EXAM: CT ABDOMEN AND PELVIS WITHOUT CONTRAST  TECHNIQUE: Multidetector CT imaging of the abdomen and pelvis was performed following the standard protocol without intravenous contrast.  COMPARISON:  Recent PET-CT on 11/16/2012 and chest CT on 11/01/2012  FINDINGS: Images through the lung bases show increased size of mass in the central right middle lobe and new small bilateral pleural effusions, right side greater than left. Large hiatal hernia is  again demonstrated.  A tiny nonobstructing 1-2 mm calculus is noted in the midpole of the left kidney. There is no evidence of hydronephrosis. No evidence of ureteral calculi or dilatation. No bladder calculi identified. Mild symmetric perinephric stranding noted bilaterally. Noncontrast images of the liver, pancreas, spleen, and adrenal glands are normal in appearance.  A simple appearing cyst is seen in the right adnexa measuring 3 x 4 cm. This is stable compared to previous study, which showed absence of hypermetabolic activity. Prior hysterectomy noted. Severe sigmoid diverticulosis is again demonstrated, however there is no evidence of diverticulitis. No other acute inflammatory process or abscess identified within the abdomen or pelvis. No evidence of dilated bowel loops. No suspicious bone lesions identified.  IMPRESSION: No acute findings within the abdomen or pelvis.  Stable 4 cm benign appearing right adnexal cyst, sigmoid diverticulosis, and hiatal hernia.  Tiny nonobstructing left renal calculus. No evidence of hydronephrosis.  Increased size of right middle lobe lung mass and right greater than left pleural effusions.   Electronically Signed   By: Myles Rosenthal M.D.   On: 01/16/2013 16:29   Dg Chest Port 1 View  01/16/2013   CLINICAL DATA:  Pneumonia  EXAM: PORTABLE CHEST - 1 VIEW  COMPARISON:  01/15/2013  FINDINGS: Heart is normal size. Right internal jugular central venous catheter remains in the right atrium, unchanged. Continued rounded airspace opacity in the right lower lung, unchanged. Minimal left base atelectasis. Heart is normal size.  IMPRESSION: No significant change.   Electronically Signed   By: Charlett Nose M.D.   On: 01/16/2013 07:13   Dg Chest Port 1 View  01/15/2013   CLINICAL DATA:  Status post central line placement  EXAM: PORTABLE CHEST - 1 VIEW  COMPARISON:  01/15/2013 1044 hr  FINDINGS: The right jugular line has now been placed. The catheter tip is noted in the rib mid  right atrium. No pneumothorax is seen. A persistent right-sided lung mass is seen. No new focal abnormality is noted.  IMPRESSION: No evidence of pneumothorax  following central line placement.   Electronically Signed   By: Alcide Clever M.D.   On: 01/15/2013 12:18     ASSESSMENT / PLAN:  PULMONARY A: NSCLC Right lung.  Post-obstructive pneumonia  >post -obstructive PNA vs ATX w/ possible XRT changes. Favor PNA P:   Supplemental oxygen  See ID & heme section F/u cxr  CARDIOVASCULAR A:  Think that this was both hypovolemia and sepsis (now off pressors). Sources for potential infection include: post-obstructive PNA and gastroenteritis. Aspiration injury also a consideration in setting of vomiting. Favoring post-obstructive PNA  P:  Pressors as indicated-->wean for SBP >100 See ID section  Low threshold for stress dose steroids  RENAL A:   Hyponatremia: volume related and improved  Hypokalemia  P:   Replace K  GASTROINTESTINAL A:   Nausea vomiting diarrhea.  Gastroenteritis: not clear if this is due to chemo or infectious  >CT abd/pelvis neg P:   Stool cultures pending PRN zofran    HEMATOLOGIC A:   NSCLC  Anemia w/ out bleeding (element of dilution) LE swelling/pain P:  Supportive care LMWH  Get LE [place], r/o DVT  INFECTIOUS A:   Post-obstructive PNA Gastro-enteritis: does not seem infectious  P:   See above  D/c flagyl   ENDOCRINE A:  Hypothyroidism DM w/ Hyperglycemia  P:   ssi  Cont synthroid   NEUROLOGIC A:   Acute septic coagulopathy  P:   Supportive care   Change to SDU status  I have personally obtained a history, examined the patient, evaluated laboratory and imaging results, formulated the assessment and plan and placed orders. CRITICAL CARE: The patient is critically ill with multiple organ systems failure and requires high complexity decision making for assessment and support, frequent evaluation and titration of therapies, application  of advanced monitoring technologies and extensive interpretation of multiple databases. Critical Care Time devoted to patient care services described in this note is 30 minutes.    Dorcas Carrow Beeper  [phone]  Cell  [phone]  If no response or cell goes to voicemail, call beeper [phone]  01/17/2013, 11:34 AM

## 2013-01-17 NOTE — Telephone Encounter (Signed)
Per Rosalva Ferron, he had called when I was at lunch and patient defers  Radiation treatment  Today dealing with , malaise, pnuemonia, and per the Nurse Practioner who preferred her rest another , but patient is progressing stated Rosalva Ferron, Thanked him then called Dr[facility]Moody at [facility] facility and informed him of status, will check on patient tomorrow, Okay per MD, will call [facility] ! And e-mail them as well no rad tx today 12:41 PM

## 2013-01-17 NOTE — Progress Notes (Signed)
Subjective: The patient is seen and examined today. She is a very pleasant 79 years old white female with recently diagnosed with a stage IIIA non-small cell lung cancer, squamous cell carcinoma and was started on treatment with concurrent chemoradiation with carboplatin and paclitaxel is status post 1 cycle. The patient had significant nausea and vomiting as well as diarrhea after the first cycle of her treatment. She completed one week of radiotherapy. She had poor by mouth intake and was dehydrated. She was admitted to [facility] on  10/20 with hypotension and dehydration as well as acute mental status change. She was started on IV hydration and feeling much better today. She is not sure if she had any recent viral gastroenteritis or not. She denied having any fever but had some  chills today.No night sweats. Cough present but not worsening. Main problem is reported nausea, unresolving.   Objective: Vital signs in last 24 hours: Temp:  [97.8 F (36.6 C)-98.6 F (37 C)] 97.9 F (36.6 C) (10/22 0400) Pulse Rate:  [35-125] 110 (10/22 0700) Resp:  [18-27] 24 (10/22 0700) BP: (95-161)/(46-89) 126/66 mmHg (10/22 0700) SpO2:  [86 %-99 %] 93 % (10/22 0700) Weight:  [162 lb 0.6 oz (73.5 kg)] 162 lb 0.6 oz (73.5 kg) (10/22 0500)  Intake/Output from previous day: 10/21 0701 - 10/22 0700 In: 2539.8 [I.V.:2239.8; IV Piggyback:300] Out: 450 [Urine:450] Intake/Output this shift:    General appearance: alert, cooperative, fatigued and mild distress Resp: trace of rales at the right base, otherwise clear bilaterally. Cardio: regular rate and rhythm, S1, S2 normal, no murmur, click, rub or gallop GI: soft, non-tender; bowel sounds normal; no masses,  no organomegaly Extremities: extremities normal, atraumatic, no cyanosis or edema. Bilateral pretibial tenderness.  Lab Results:   Recent Labs  01/16/13 0440 01/17/13 0550  WBC 13.8* 11.6*  HGB 8.5* 8.1*  HCT 25.5* 24.6*  PLT 335 326    BMET  Recent Labs  01/16/13 2105 01/17/13 0550  NA 128* 130*  K 3.7 3.4*  CL 99 99  CO2 20 22  GLUCOSE 114* 112*  BUN 11 9  CREATININE 0.40* 0.42*  CALCIUM 9.8 10.4    Studies/Results: Ct Abdomen Pelvis Wo Contrast  01/16/2013   CLINICAL DATA:  Abdominal sepsis.  Right lung mass.  EXAM: CT ABDOMEN AND PELVIS WITHOUT CONTRAST  TECHNIQUE: Multidetector CT imaging of the abdomen and pelvis was performed following the standard protocol without intravenous contrast.  COMPARISON:  Recent PET-CT on 11/16/2012 and chest CT on 11/01/2012  FINDINGS: Images through the lung bases show increased size of mass in the central right middle lobe and new small bilateral pleural effusions, right side greater than left. Large hiatal hernia is again demonstrated.  A tiny nonobstructing 1-2 mm calculus is noted in the midpole of the left kidney. There is no evidence of hydronephrosis. No evidence of ureteral calculi or dilatation. No bladder calculi identified. Mild symmetric perinephric stranding noted bilaterally. Noncontrast images of the liver, pancreas, spleen, and adrenal glands are normal in appearance.  A simple appearing cyst is seen in the right adnexa measuring 3 x 4 cm. This is stable compared to previous study, which showed absence of hypermetabolic activity. Prior hysterectomy noted. Severe sigmoid diverticulosis is again demonstrated, however there is no evidence of diverticulitis. No other acute inflammatory process or abscess identified within the abdomen or pelvis. No evidence of dilated bowel loops. No suspicious bone lesions identified.  IMPRESSION: No acute findings within the abdomen or pelvis.  Stable  4 cm benign appearing right adnexal cyst, sigmoid diverticulosis, and hiatal hernia.  Tiny nonobstructing left renal calculus. No evidence of hydronephrosis.  Increased size of right middle lobe lung mass and right greater than left pleural effusions.   Electronically Signed   By: Myles Rosenthal  M.D.   On: 01/16/2013 16:29   Dg Chest Port 1 View  01/16/2013   CLINICAL DATA:  Pneumonia  EXAM: PORTABLE CHEST - 1 VIEW  COMPARISON:  01/15/2013  FINDINGS: Heart is normal size. Right internal jugular central venous catheter remains in the right atrium, unchanged. Continued rounded airspace opacity in the right lower lung, unchanged. Minimal left base atelectasis. Heart is normal size.  IMPRESSION: No significant change.   Electronically Signed   By: Charlett Nose M.D.   On: 01/16/2013 07:13   Dg Chest Port 1 View  01/15/2013   CLINICAL DATA:  Status post central line placement  EXAM: PORTABLE CHEST - 1 VIEW  COMPARISON:  01/15/2013 1044 hr  FINDINGS: The right jugular line has now been placed. The catheter tip is noted in the rib mid right atrium. No pneumothorax is seen. A persistent right-sided lung mass is seen. No new focal abnormality is noted.  IMPRESSION: No evidence of pneumothorax following central line placement.   Electronically Signed   By: Alcide Clever M.D.   On: 01/15/2013 12:18   Dg Chest Port 1 View  01/15/2013   CLINICAL DATA:  Lung cancer. Altered mental status. Hypertension.  EXAM: PORTABLE CHEST - 1 VIEW  COMPARISON:  12/14/2012  FINDINGS: The right middle/upper lobe lung mass measures 7.8 cm craniocaudad, increased from 7.3 cm, although some of this increase may be due to the use of AP portable projection instead of the PA projection, which would tend to magnify the anteriorly located tumor compared to the prior exam.  Upper normal heart size. Stable left basilar scarring. Hiatal hernia noted. Tortuous thoracic aorta. There is some increase in bandlike opacity along the lateral margin of the right lung mass, favoring postobstructive atelectasis or pneumonitis.  Low lung volumes are present, causing crowding of the pulmonary vasculature.  IMPRESSION: 1. Increase in postobstructive atelectasis or pneumonitis associated with the mass involving the right middle lobe and right upper  lobe. 2. Mass measures slightly greater craniocaudad than on the prior chest x-ray from last month, although some of this may be projectional (today's exam was in AP projection and last months exam was a PA projection) 3. Stable left basilar scarring. 4. Hiatal hernia. 5. Low lung volumes.   Electronically Signed   By: Herbie Baltimore M.D.   On: 01/15/2013 11:17    Medications: I have reviewed the patient's current medications.  Assessment/Plan: This is a very pleasant 79 year- old white female with recently diagnosed with a stage IIIa non-small cell lung cancer, squamous cell carcinoma status post 1 cycle of concurrent chemoradiation with reduced dose carboplatin and paclitaxel.Possibly to receive radiation today. The patient was admitted with dehydration, hypotension and septic shock.Symptoms improving with IVF and supportive care.  2.GI: for nausea,  Zofran to be initiated for improvement of symptoms.   C difficile negative  3.Pneumonia, likely post obstructive, as per CCM On  Azactam, Vanc and Flagyl 4. Anemia, continue to transfuse to keep Hb >8 I will arrange for the patient a follow up appointment with me after her discharge for more detailed discussion about her treatment options. Thank you for taking good care of her off Ms. Andrew Au.Will continue to follow the patient  with you and assist in her management on as-needed basis.   LOS: 2 days    [facility] E 01/17/2013  ADDENDUM: Hematology/Oncology Attending: The patient is seen and examined today. I agree with the above note. She is feeling much better today except for nausea and vomiting as well as few episodes of diarrhea. I will start the patient on Zofran 8 mg IV every 8 hours as needed for nausea. C. difficile was negative and the patient may benefit from treatment with Imodium plus/minus Lomotil for the diarrhea. Continue IV hydration and supportive care.

## 2013-01-17 NOTE — Telephone Encounter (Signed)
Called Dr.Moody at [facility] facility informed him of patient's admission to hospital [address], he is aware waiting on RN to call and let know if Rad tx can be done today .bnow

## 2013-01-17 NOTE — Telephone Encounter (Signed)
Called [phone] ICU for room 1226,asked Sean,RN if patient can come for radiation treatment today,  And that she is due for treatment this am 09:30am, Gregary Signs, RN asked her time could be switched to this afternoon he needs to discuss with her Doctors, thanked [place], and [employer] [place] [address] and gave status, waiting for Rn to call back for yes or no to radiatin treatment 8:20 AM

## 2013-01-18 ENCOUNTER — Inpatient Hospital Stay (HOSPITAL_COMMUNITY): Payer: Medicare Other

## 2013-01-18 ENCOUNTER — Ambulatory Visit: Payer: Medicare Other

## 2013-01-18 ENCOUNTER — Telehealth: Payer: Self-pay | Admitting: *Deleted

## 2013-01-18 DIAGNOSIS — J189 Pneumonia, unspecified organism: Secondary | ICD-10-CM

## 2013-01-18 DIAGNOSIS — D649 Anemia, unspecified: Secondary | ICD-10-CM

## 2013-01-18 DIAGNOSIS — E876 Hypokalemia: Secondary | ICD-10-CM

## 2013-01-18 HISTORY — DX: Pneumonia, unspecified organism: J18.9

## 2013-01-18 HISTORY — DX: Anemia, unspecified: D64.9

## 2013-01-18 LAB — CBC
Hemoglobin: 8.3 g/dL — ABNORMAL LOW (ref 12.0–15.0)
MCH: 28.6 pg (ref 26.0–34.0)
MCHC: 33.2 g/dL (ref 30.0–36.0)
Platelets: 352 10*3/uL (ref 150–400)
RBC: 2.9 MIL/uL — ABNORMAL LOW (ref 3.87–5.11)
RDW: 13.8 % (ref 11.5–15.5)
WBC: 7.9 10*3/uL (ref 4.0–10.5)

## 2013-01-18 LAB — GLUCOSE, CAPILLARY
Glucose-Capillary: 98 mg/dL (ref 70–99)
Glucose-Capillary: 92 mg/dL (ref 70–99)
Glucose-Capillary: 111 mg/dL — ABNORMAL HIGH (ref 70–99)
Glucose-Capillary: 80 mg/dL (ref 70–99)

## 2013-01-18 LAB — BASIC METABOLIC PANEL
Calcium: 10.7 mg/dL — ABNORMAL HIGH (ref 8.4–10.5)
GFR calc non Af Amer: >90 mL/min (ref >90)
Glucose, Bld: 102 mg/dL — ABNORMAL HIGH (ref 70–99)
Potassium: 3.2 mEq/L — ABNORMAL LOW (ref 3.5–5.1)
Sodium: 135 mEq/L (ref 135–145)

## 2013-01-18 LAB — MAGNESIUM: Magnesium: 1.3 mg/dL — ABNORMAL LOW (ref 1.5–2.5)

## 2013-01-18 MED ORDER — POTASSIUM CHLORIDE CRYS ER 20 MEQ PO TBCR
30.0000 meq | EXTENDED_RELEASE_TABLET | ORAL | Status: AC
  Administered 2013-01-18 (×2): 30 meq via ORAL
  Filled 2013-01-18 (×2): qty 1

## 2013-01-18 MED ORDER — POTASSIUM CHLORIDE CRYS ER 20 MEQ PO TBCR: 40.0000 meq | EXTENDED_RELEASE_TABLET | ORAL | Status: DC

## 2013-01-18 NOTE — Progress Notes (Signed)
[facility] PROGRESS NOTE  YARET HUSH HQI:[id number] DOB: Jan 05, 1930 DOA: 01/15/2013 PCP: Kirk Ruths, MD  Assessment/Plan: Shock -Resolved. -Presumed related to sepsis from PNA as well as a component of hypovolemia from GI losses. -Off pressors for 24 hours.  Post-obstructive PNA -Continue vanc/aztreonam for now. -All cx data remains negative to date.  Hypokalemia -From GI losses. -Replete PO. -Check Mag levels.  N/v/diarrhea -Seems to have mostly resolved at this point. -Likely related to chemo, altho infectious causes not ruled out. -Stool cx remains negative.  Hypothyroidism -Continue synthroid.  Anemia -Presumed related to chemotherapy. -No indication for transfusion at present. -Continue to monitor Hb.  Non Small Cell Lung Cancer -Hold chemo/radiation while acutely ill. -Follow up with onc/rad onc as an OP.  DVT Prophylaxis -Lovenox  Code Status: Full Code Family Communication: Daughter Bonita Quin at bedside updated on plan of care.  Disposition Plan: Transfer to floor. Consult PT/OT for DC recs.   Consultants:  None   Antibiotics:  Vancomycin  Aztreonam   Subjective: Still with cough and chest pain.  Objective: Filed Vitals:   01/18/13 0000 01/18/13 0020 01/18/13 0051 01/18/13 0400  BP:  108/52  123/66  Pulse:      Temp: 98.3 F (36.8 C)   97.5 F (36.4 C)  TempSrc: Oral   Oral  Resp:  21  20  Height:      Weight:   75.5 kg (166 lb 7.2 oz)   SpO2:  97%  100%    Intake/Output Summary (Last 24 hours) at 01/18/13 0851 Last data filed at 01/18/13 0600  Gross per 24 hour  Intake   2250 ml  Output   2450 ml  Net   -200 ml   Filed Weights   01/16/13 0534 01/17/13 0500 01/18/13 0051  Weight: 73.4 kg (161 lb 13.1 oz) 73.5 kg (162 lb 0.6 oz) 75.5 kg (166 lb 7.2 oz)    Exam:   General:  AA Ox3  Cardiovascular: RRR  Respiratory: Bilateral ronchi  Abdomen: S/NT/ND/+BS  Extremities: no C/C/E   Neurologic:  Grossly  intact and non-focal.  Data Reviewed: Basic Metabolic Panel:  Recent Labs Lab 01/15/13 0947 01/15/13 1018 01/16/13 0440 01/16/13 2105 01/17/13 0550 01/18/13 0400  NA 127* 128* 132* 128* 130* 135  K 3.4* 2.9* 2.8* 3.7 3.4* 3.2*  CL 89* 91* 101 99 99 102  CO2 24  --  24 20 22 27   GLUCOSE 226* 221* 158* 114* 112* 102*  BUN 31* 30* 15 11 9 9   CREATININE 0.78 1.00 0.46* 0.40* 0.42* 0.44*  CALCIUM 10.4  --  9.3 9.8 10.4 10.7*   Liver Function Tests:  Recent Labs Lab 01/15/13 0947 01/16/13 0440 01/17/13 0550  AST 27 21 24   ALT 13 10 15   ALKPHOS 92 73 90  BILITOT 0.9 0.6 0.4  PROT 7.2 5.6* 5.8*  ALBUMIN 2.9* 2.1* 2.1*   No results found for this basename: LIPASE, AMYLASE,  in the last 168 hours No results found for this basename: AMMONIA,  in the last 168 hours CBC:  Recent Labs Lab 01/15/13 0947 01/15/13 1018 01/16/13 0440 01/17/13 0550 01/18/13 0530  WBC 15.7*  --  13.8* 11.6* 7.9  NEUTROABS 13.6*  --   --   --   --   HGB 9.6* 10.5* 8.5* 8.1* 8.3*  HCT 28.7* 31.0* 25.5* 24.6* 25.0*  MCV 84.9  --  85.9 85.4 86.2  PLT 394  --  335 326 352   Cardiac Enzymes:  Recent  Labs Lab 01/15/13 1300  TROPONINI <0.30   BNP (last 3 results) No results found for this basename: PROBNP,  in the last 8760 hours CBG:  Recent Labs Lab 01/17/13 1548 01/17/13 2013 01/17/13 2315 01/18/13 0335 01/18/13 0755  GLUCAP 94 87 104* 98 92    Recent Results (from the past 240 hour(s))  URINE CULTURE     Status: None   Collection Time    01/15/13  9:57 AM      Result Value Range Status   Specimen Description URINE, CATHETERIZED   Final   Special Requests NONE   Final   Culture  Setup Time     Final   Value: 01/15/2013 13:54     Performed at [facility] Count     Final   Value: NO GROWTH     Performed at [facility]   Culture     Final   Value: NO GROWTH     Performed at [facility]   Report Status 01/16/2013 FINAL   Final   CULTURE, BLOOD (ROUTINE X 2)     Status: None   Collection Time    01/15/13 10:05 AM      Result Value Range Status   Specimen Description BLOOD LEFT ANTECUBITAL   Final   Special Requests BOTTLES DRAWN AEROBIC AND ANAEROBIC EA   Final   Culture  Setup Time     Final   Value: 01/15/2013 13:52     Performed at [facility]   Culture     Final   Value:        BLOOD CULTURE RECEIVED NO GROWTH TO DATE CULTURE WILL BE HELD FOR 5 DAYS BEFORE ISSUING A FINAL NEGATIVE REPORT     Performed at [facility]   Report Status PENDING   Incomplete  CULTURE, BLOOD (ROUTINE X 2)     Status: None   Collection Time    01/15/13 10:40 AM      Result Value Range Status   Specimen Description BLOOD RIGHT ANTECUBITAL   Final   Special Requests BOTTLES DRAWN AEROBIC AND ANAEROBIC EA   Final   Culture  Setup Time     Final   Value: 01/15/2013 13:52     Performed at [facility]   Culture     Final   Value:        BLOOD CULTURE RECEIVED NO GROWTH TO DATE CULTURE WILL BE HELD FOR 5 DAYS BEFORE ISSUING A FINAL NEGATIVE REPORT     Performed at [facility]   Report Status PENDING   Incomplete  MRSA PCR SCREENING     Status: Abnormal   Collection Time    01/15/13  1:31 PM      Result Value Range Status   MRSA by PCR POSITIVE (*) NEGATIVE Final   Comment:            The GeneXpert MRSA Assay (FDA     approved for NASAL specimens     only), is one component of a     comprehensive MRSA colonization     surveillance program. It is not     intended to diagnose MRSA     infection nor to guide or     monitor treatment for     MRSA infections.     RESULT CALLED TO, READ BACK BY AND VERIFIED WITH:     RUSSELL,S. AT 1642 ON 10.20.14 BY LOVE,T.  CLOSTRIDIUM DIFFICILE BY PCR     Status: None   Collection Time    01/15/13  3:25 PM      Result Value Range Status   C difficile by pcr NEGATIVE  NEGATIVE Final   Comment: Performed at [facility]  STOOL CULTURE      Status: None   Collection Time    01/15/13  3:25 PM      Result Value Range Status   Specimen Description STOOL   Final   Special Requests Normal   Final   Culture     Final   Value: NO SUSPICIOUS COLONIES, CONTINUING TO HOLD     Performed at [facility]   Report Status PENDING   Incomplete  URINE CULTURE     Status: None   Collection Time    01/15/13  4:41 PM      Result Value Range Status   Specimen Description URINE, CLEAN CATCH   Final   Special Requests NONE   Final   Culture  Setup Time     Final   Value: 01/16/2013 03:13     Performed at [facility] Count     Final   Value: NO GROWTH     Performed at [facility]   Culture     Final   Value: NO GROWTH     Performed at [facility]   Report Status 01/17/2013 FINAL   Final     Studies: Ct Abdomen Pelvis Wo Contrast  01/16/2013   CLINICAL DATA:  Abdominal sepsis.  Right lung mass.  EXAM: CT ABDOMEN AND PELVIS WITHOUT CONTRAST  TECHNIQUE: Multidetector CT imaging of the abdomen and pelvis was performed following the standard protocol without intravenous contrast.  COMPARISON:  Recent PET-CT on 11/16/2012 and chest CT on 11/01/2012  FINDINGS: Images through the lung bases show increased size of mass in the central right middle lobe and new small bilateral pleural effusions, right side greater than left. Large hiatal hernia is again demonstrated.  A tiny nonobstructing 1-2 mm calculus is noted in the midpole of the left kidney. There is no evidence of hydronephrosis. No evidence of ureteral calculi or dilatation. No bladder calculi identified. Mild symmetric perinephric stranding noted bilaterally. Noncontrast images of the liver, pancreas, spleen, and adrenal glands are normal in appearance.  A simple appearing cyst is seen in the right adnexa measuring 3 x 4 cm. This is stable compared to previous study, which showed absence of hypermetabolic activity. Prior hysterectomy noted. Severe  sigmoid diverticulosis is again demonstrated, however there is no evidence of diverticulitis. No other acute inflammatory process or abscess identified within the abdomen or pelvis. No evidence of dilated bowel loops. No suspicious bone lesions identified.  IMPRESSION: No acute findings within the abdomen or pelvis.  Stable 4 cm benign appearing right adnexal cyst, sigmoid diverticulosis, and hiatal hernia.  Tiny nonobstructing left renal calculus. No evidence of hydronephrosis.  Increased size of right middle lobe lung mass and right greater than left pleural effusions.   Electronically Signed   By: Myles Rosenthal M.D.   On: 01/16/2013 16:29   Dg Chest Port 1 View  01/18/2013   CLINICAL DATA:  Pneumonia.  EXAM: PORTABLE CHEST - 1 VIEW  COMPARISON:  01/16/2013  FINDINGS: Central venous catheter is in lower SVC region and unchanged. Stable appearance of the right chest mass. Slightly improved aeration of the lungs compared to the previous examination. Again noted are coarse  interstitial lung markings which are unchanged. Few densities at the left lung base are most compatible with atelectasis. Heart and mediastinum are stable.  IMPRESSION: No change in appearance of the large right lung mass.  Left basilar densities are most compatible with atelectasis.   Electronically Signed   By: Richarda Overlie M.D.   On: 01/18/2013 07:22    Scheduled Meds: . aztreonam  1 g Intravenous Q8H  . Chlorhexidine Gluconate Cloth  6 each Topical Q0600  . enoxaparin (LOVENOX) injection  40 mg Subcutaneous Q24H  . famotidine  20 mg Oral QHS  . insulin aspart  2-6 Units Subcutaneous Q4H  . levothyroxine  88 mcg Oral QAC breakfast  . mupirocin ointment  1 application Nasal BID  . potassium chloride  30 mEq Oral Q4H  . potassium chloride  40 mEq Oral Q4H  . vancomycin  750 mg Intravenous Q12H   Continuous Infusions: . sodium chloride 100 mL/hr at 01/18/13 0308    Active Problems:   Non-small cell carcinoma of lung   Severe  sepsis with septic shock   Diarrhea   PNA (pneumonia)   Hypokalemia   Anemia    Time spent: 45 minutes    HERNANDEZ ACOSTA,ESTELA  [facility] Pager [phone]  If 7PM-7AM, please contact night-coverage at [email or web address], password [facility] 01/18/2013, 8:51 AM  LOS: 3 days

## 2013-01-18 NOTE — Evaluation (Signed)
Physical Therapy Evaluation Patient Details Name: Peggy Espinoza MRN: [id number] DOB: November 20, 1929 Today's Date: 01/18/2013 Time: 8469-6295 PT Time Calculation (min): 21 min  PT Assessment / Plan / Recommendation History of Present Illness  Pt is an 79 Y/O F w/ 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 She has been followed by Dr Gwenyth Bouillon and Dr Mitzi Hansen. Underwent first chemo on 10/13 (Taxol & carboplatin) and her first round of XRT on 10/17.  Admitted on 10/20 w/ hypotension, dehydration, and AMS after several days of nausea, vomiting and diarrhea (first noted on 10/16).   Clinical Impression  Pt admitted with above and sepsis. Pt currently presenting with functional limitations due to the deficits listed below (see PT Problem List).  Pt limited by fatigue and dizziness during session today, pt was able to transfer with +2 assist to chair.  PT recommends SNF at d/c to increase strength, endurance, and safety.  However, pt may progress to HHPT if family able to manage pt at home. Pt reports her daughter would stay with pt upon d/c, if needed. Pt would continue to benefit from skilled PT in order to increase independence and safety during mobility to allow discharge to venue listed below.    PT Assessment  Patient needs continued PT services    Follow Up Recommendations  Supervision/Assistance - 24 hour;SNF    Does the patient have the potential to tolerate intense rehabilitation      Barriers to Discharge        Equipment Recommendations  Rolling walker with 5" wheels    Recommendations for Other Services     Frequency Min 3X/week    Precautions / Restrictions Precautions Precautions: Fall Restrictions Weight Bearing Restrictions: No   Pertinent Vitals/Pain Pt reports abdominal and throat pain at rest, unable to rate. Pt also reports R side pain at end of session that subsided quickly. Pt positioned to comfort at end of session. VSS during session with pt  on 5L of O2 [place].      Mobility  Bed Mobility Bed Mobility: Rolling Right;Right Sidelying to Sit;Sitting - Scoot to [place] of Bed Rolling Right: 4: Min assist Right Sidelying to Sit: 3: Mod assist;HOB elevated Sitting - Scoot to Edge of Bed: 4: Min guard Details for Bed Mobility Assistance: assist required due to fatigue and to guide trunk into sitting and LEs off EOB. VC's for hand placement. Pt reports dizziness upon sitting, so PT waited to perform sit<>stand transfer until dizziness subsided. Vitals monitored during session, please see vitals sectinon for details. Transfers Transfers: Sit to Stand;Stand to Sit Sit to Stand: 1: +2 Total assist;With upper extremity assist;From elevated surface;From bed Sit to Stand: Patient Percentage: 70% Stand to Sit: 1: +2 Total assist;With upper extremity assist;With armrests;To chair/3-in-1 Stand to Sit: Patient Percentage: 70% Details for Transfer Assistance: +2 assist to ensure safety and to manage lines. VC's for hand placement. Ambulation/Gait Ambulation/Gait Assistance: Not tested (comment) Ambulation/Gait Assistance Details: not assessed due to dizziness and fatigue.    Exercises     PT Diagnosis: Difficulty walking;Generalized weakness  PT Problem List: Decreased strength;Decreased activity tolerance;Decreased mobility;Decreased knowledge of use of DME PT Treatment Interventions: DME instruction;Gait training;Stair training;Functional mobility training;Therapeutic activities;Therapeutic exercise;Balance training;Neuromuscular re-education;Patient/family education     PT Goals(Current goals can be found in the care plan section) Acute Rehab PT Goals Patient Stated Goal: none specified PT Goal Formulation: With patient Time For Goal Achievement: 02/01/13 Potential to Achieve Goals: Good  Visit Information  Last PT Received On: 01/18/13 Assistance Needed: +2 (for safety and to manage lines) History of Present Illness: Pt is an 79 Y/O F  w/ 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 She has been followed by Dr Gwenyth Bouillon and Dr Mitzi Hansen. Underwent first chemo on 10/13 (Taxol & carboplatin) and her first round of XRT on 10/17.  Admitted on 10/20 w/ hypotension, dehydration, and AMS after several days of nausea, vomiting and diarrhea (first noted on 10/16).        Prior Functioning  Home Living Family/patient expects to be discharged to:: Private residence Living Arrangements: Alone Available Help at Discharge: Family;Available 24 hours/day (pt reports daughter is able to stay with pt 24/7 upon d/c.) Type of Home: House Home Access: Stairs to enter [employer] of Steps: 5 Entrance Stairs-Rails: Can reach both Home Layout: One level Home Equipment: Walker - standard;Cane - single point;Shower seat;Grab bars - tub/shower Prior Function Level of Independence: Independent Comments: pt reports she has assistive devices at home but was able to ambulate ind and perform ADLs ind. Communication Communication: Other (comment) (pt reports having difficulty with word finding)    Cognition  Cognition Arousal/Alertness: Awake/alert Behavior During Therapy: WFL for tasks assessed/performed Overall Cognitive Status: Within Functional Limits for tasks assessed    Extremity/Trunk Assessment Lower Extremity Assessment Lower Extremity Assessment: Generalized weakness   Balance    End of Session PT - End of Session Equipment Utilized During Treatment: Oxygen Activity Tolerance: Patient limited by fatigue Patient left: in chair;with call bell/phone within reach Nurse Communication: Mobility status  GP     Sol Blazing 01/18/2013, 12:16 PM

## 2013-01-18 NOTE — Telephone Encounter (Signed)
Called the ICU floor for patient in 1226,spoke with RN Dwana Curd, asked if patient could come for radoiation treatment today, she wasn't sure who determined that, asked her to call the Physician  And to call me back at [phone], will wait for call back and inform Dr,.Moody and the [place] [facility] therapists 8:25 AM

## 2013-01-18 NOTE — Progress Notes (Signed)
CARE MANAGEMENT NOTE 01/18/2013  Patient:  Peggy Espinoza, Peggy Espinoza   Account Number:  [id number]  Date Initiated:  01/18/2013  Documentation initiated by:  Rhilee Currin  Subjective/Objective Assessment:   pt with confirmed lung mass and sepsis now resolving, being moved out of sdu to floor [id number]/continues with low grade temp, iv abx and ivlfd     Action/Plan:   from home and lives alone does have family support.   Anticipated DC Date:  01/21/2013   Anticipated DC Plan:  HOME/SELF CARE  In-house referral  NA      DC Planning Services  NA      [facility] Choice  NA   Choice offered to / List presented to:  NA   DME arranged  NA      DME agency  NA     HH arranged  NA      HH agency  NA   Status of service:  In process, will continue to follow Medicare Important Message given?  NA - LOS <3 / Initial given by admissions (If response is "NO", the following Medicare IM given date fields will be blank) Date Medicare IM given:   Date Additional Medicare IM given:    Discharge Disposition:    Per UR Regulation:  Reviewed for med. necessity/level of care/duration of stay  If discussed at Long Length of Stay Meetings, dates discussed:    Comments:  [id number]/Roby Spalla Stark Jock, BSN, [place] [phone] Chart Reviewed for discharge and hospital needs. Discharge needs at time of review:  None Review of patient progress due on [id number].

## 2013-01-18 NOTE — Progress Notes (Signed)
MD notified of patient's request for something to eat. Order given for a carb modified diet. MD also made aware of patient's mental status as pt is having some confusion to place and time. Said to monitor patient. Will notify if necessary. Pt remains cooperative and calm. Also somnolent.  Vwilliams,rn.

## 2013-01-18 NOTE — Progress Notes (Signed)
[facility] ADULT ICU REPLACEMENT PROTOCOL FOR AM LAB REPLACEMENT ONLY  The patient does apply for the [facility] Adult ICU Electrolyte Replacment Protocol based on the criteria listed below:   1. Is GFR >/= 40 ml/min? yes  Patient's GFR today is >90 2. Is urine output >/= 0.5 ml/kg/hr for the last 6 hours? yes Patient's UOP is 2.6 ml/kg/hr 3. Is BUN < 60 mg/dL? yes  Patient's BUN today is 9 4. Abnormal electrolyte(s):K 3.2 5. Ordered repletion with: per protocol 6. If a panic level lab has been reported, has the CCM MD in charge been notified? no.   Physician:    Markus Daft A 01/18/2013 5:25 AM

## 2013-01-18 NOTE — Evaluation (Signed)
I have reviewed this note and agree with all findings. Kati Senita Corredor, PT, DPT Pager: [phone]   

## 2013-01-19 ENCOUNTER — Ambulatory Visit: Payer: Medicare Other

## 2013-01-19 LAB — CBC
HCT: 25.2 % — ABNORMAL LOW (ref 36.0–46.0)
Hemoglobin: 8.5 g/dL — ABNORMAL LOW (ref 12.0–15.0)
MCV: 86.6 fL (ref 78.0–100.0)
Platelets: 377 10*3/uL (ref 150–400)
RBC: 2.91 MIL/uL — ABNORMAL LOW (ref 3.87–5.11)
WBC: 8.9 10*3/uL (ref 4.0–10.5)

## 2013-01-19 LAB — BASIC METABOLIC PANEL
CO2: 28 mEq/L (ref 19–32)
Calcium: 10.4 mg/dL (ref 8.4–10.5)
Chloride: 98 mEq/L (ref 96–112)
Glucose, Bld: 100 mg/dL — ABNORMAL HIGH (ref 70–99)
Potassium: 3 mEq/L — ABNORMAL LOW (ref 3.5–5.1)
Sodium: 135 mEq/L (ref 135–145)

## 2013-01-19 LAB — GLUCOSE, CAPILLARY
Glucose-Capillary: 96 mg/dL (ref 70–99)
Glucose-Capillary: 92 mg/dL (ref 70–99)
Glucose-Capillary: 117 mg/dL — ABNORMAL HIGH (ref 70–99)

## 2013-01-19 LAB — STOOL CULTURE: Special Requests: NORMAL

## 2013-01-19 MED ORDER — POTASSIUM CHLORIDE CRYS ER 20 MEQ PO TBCR
40.0000 meq | EXTENDED_RELEASE_TABLET | ORAL | Status: AC
  Administered 2013-01-19 (×2): 40 meq via ORAL
  Filled 2013-01-19 (×2): qty 2

## 2013-01-19 MED ORDER — BOOST PLUS PO LIQD
237.0000 mL | Freq: Two times a day (BID) | ORAL | Status: DC
  Administered 2013-01-19 – 2013-01-22 (×5): 237 mL via ORAL
  Filled 2013-01-19 (×9): qty 237

## 2013-01-19 MED ORDER — MAGNESIUM SULFATE 40 MG/ML IJ SOLN
2.0000 g | Freq: Once | INTRAMUSCULAR | Status: AC
  Administered 2013-01-19: 2 g via INTRAVENOUS
  Filled 2013-01-19 (×2): qty 50

## 2013-01-19 MED ORDER — POTASSIUM CHLORIDE CRYS ER 20 MEQ PO TBCR
30.0000 meq | EXTENDED_RELEASE_TABLET | Freq: Once | ORAL | Status: DC
  Filled 2013-01-19: qty 1

## 2013-01-19 MED ORDER — OXYCODONE HCL 5 MG PO TABS
5.0000 mg | ORAL_TABLET | ORAL | Status: DC | PRN
  Administered 2013-01-19 – 2013-01-22 (×5): 5 mg via ORAL
  Filled 2013-01-19 (×5): qty 1

## 2013-01-19 MED ORDER — POTASSIUM CHLORIDE 20 MEQ/15ML (10%) PO LIQD
40.0000 meq | Freq: Once | ORAL | Status: DC
  Filled 2013-01-19: qty 30

## 2013-01-19 NOTE — Plan of Care (Addendum)
Problem: Phase I Progression Outcomes Goal: O2 sats > or equal 90% or at baseline Outcome: Progressing Patient would not maintain keeping oxygen in place. O2 sats >90% on room air. Will monitor

## 2013-01-19 NOTE — Progress Notes (Addendum)
Clinical Social Work Department CLINICAL SOCIAL WORK PLACEMENT NOTE 01/19/2013  Patient:  Peggy Espinoza, Peggy Espinoza  Account Number:  [id number] Admit date:  01/15/2013  Clinical Social Worker:  Jacelyn Grip  Date/time:  01/19/2013 04:00 PM  Clinical Social Work is seeking post-discharge placement for this patient at the following level of care:   SKILLED NURSING   (*CSW will update this form in Epic as items are completed)   01/19/2013  Patient/family provided with [facility] Department of Clinical Social Work's list of facilities offering this level of care within the geographic area requested by the patient (or if unable, by the patient's family).  01/19/2013  Patient/family informed of their freedom to choose among providers that offer the needed level of care, that participate in Medicare, Medicaid or managed care program needed by the patient, have an available bed and are willing to accept the patient.  01/19/2013  Patient/family informed of [facility]' ownership interest in [facility], as well as of the fact that they are under no obligation to receive care at this facility.  PASARR submitted to EDS on 01/19/2013 PASARR number received from EDS on 01/19/2013  FL2 transmitted to all facilities in geographic area requested by pt/family on  01/19/2013 FL2 transmitted to all facilities within larger geographic area on   Patient informed that his/her managed care company has contracts with or will negotiate with  certain facilities, including the following:     Patient/family informed of bed offers received:  01/20/2013 Patient chooses bed at [facility] Physician recommends and patient chooses bed at    Patient to be transferred to  on  [facility] on 01/22/2013 Patient to be transferred to facility by ambulance Sharin Mons)  The following physician request were entered in Epic:   Additional Comments:   Jacklynn Lewis, MSW, LCSWA   Clinical Social Work [phone]

## 2013-01-19 NOTE — Plan of Care (Signed)
Problem: Phase I Progression Outcomes Goal: O2 sats > or equal 90% or at baseline Outcome: Not Met (add Reason) o2 sats dropped to low to mid 80's on room air while sleeping. o2 at 3l [place] reapplied

## 2013-01-19 NOTE — Progress Notes (Signed)
[facility] PROGRESS NOTE  Peggy Espinoza ZOX:[id number] DOB: 03-May-1929 DOA: 01/15/2013 PCP: Kirk Ruths, MD  Assessment/Plan: Shock -Resolved. -Presumed related to sepsis from PNA as well as a component of hypovolemia from GI losses.  Post-obstructive PNA -Continue vanc/aztreonam for now. -May transition to PO abx in am if remains afebrile and without leukocytosis. -All cx data remains negative to date. -Continues to have some chest wall pain related to PNA/ATX.  Hypokalemia -From GI losses. -Replete PO.  Hypomagnesemia -Replete IV. -Recheck in am.  N/v/diarrhea -Seems to have mostly resolved at this point. -Likely related to chemo, altho infectious causes not ruled out. -Stool cx remains negative.  Hypothyroidism -Continue synthroid.  Anemia -Presumed related to chemotherapy. -No indication for transfusion at present. -Continue to monitor Hb.  Non Small Cell Lung Cancer -Hold chemo/radiation while acutely ill. -Follow up with onc/rad onc as an OP.  DVT Prophylaxis -Lovenox  Code Status: Full Code Family Communication: Patient only  Disposition Plan: PT recs: 24 hour care vs SNF.   Consultants:  None   Antibiotics:  Vancomycin  Aztreonam   Subjective: Still with cough and chest pain.  Objective: Filed Vitals:   01/19/13 0444 01/19/13 0450 01/19/13 0625 01/19/13 0757  BP: 120/68     Pulse: 116 106 102 97  Temp: 98.5 F (36.9 C)     TempSrc: Oral     Resp: 20     Height:      Weight:      SpO2: 92% 95% 94% 92%    Intake/Output Summary (Last 24 hours) at 01/19/13 1229 Last data filed at 01/19/13 0650  Gross per 24 hour  Intake 2283.34 ml  Output   1000 ml  Net 1283.34 ml   Filed Weights   01/16/13 0534 01/17/13 0500 01/18/13 0051  Weight: 73.4 kg (161 lb 13.1 oz) 73.5 kg (162 lb 0.6 oz) 75.5 kg (166 lb 7.2 oz)    Exam:   General:  AA Ox3  Cardiovascular: RRR  Respiratory: Bilateral ronchi  Abdomen:  S/NT/ND/+BS  Extremities: no C/C/E   Neurologic:  Grossly intact and non-focal.  Data Reviewed: Basic Metabolic Panel:  Recent Labs Lab 01/16/13 0440 01/16/13 2105 01/17/13 0550 01/18/13 0400 01/19/13 0449  NA 132* 128* 130* 135 135  K 2.8* 3.7 3.4* 3.2* 3.0*  CL 101 99 99 102 98  CO2 24 20 22 27 28   GLUCOSE 158* 114* 112* 102* 100*  BUN 15 11 9 9 7   CREATININE 0.46* 0.40* 0.42* 0.44* 0.40*  CALCIUM 9.3 9.8 10.4 10.7* 10.4  MG  --   --   --  1.3*  --    Liver Function Tests:  Recent Labs Lab 01/15/13 0947 01/16/13 0440 01/17/13 0550  AST 27 21 24   ALT 13 10 15   ALKPHOS 92 73 90  BILITOT 0.9 0.6 0.4  PROT 7.2 5.6* 5.8*  ALBUMIN 2.9* 2.1* 2.1*   No results found for this basename: LIPASE, AMYLASE,  in the last 168 hours No results found for this basename: AMMONIA,  in the last 168 hours CBC:  Recent Labs Lab 01/15/13 0947 01/15/13 1018 01/16/13 0440 01/17/13 0550 01/18/13 0530 01/19/13 0449  WBC 15.7*  --  13.8* 11.6* 7.9 8.9  NEUTROABS 13.6*  --   --   --   --   --   HGB 9.6* 10.5* 8.5* 8.1* 8.3* 8.5*  HCT 28.7* 31.0* 25.5* 24.6* 25.0* 25.2*  MCV 84.9  --  85.9 85.4 86.2 86.6  PLT 394  --  335 326 352 377   Cardiac Enzymes:  Recent Labs Lab 01/15/13 1300  TROPONINI <0.30   BNP (last 3 results) No results found for this basename: PROBNP,  in the last 8760 hours CBG:  Recent Labs Lab 01/18/13 1545 01/18/13 1930 01/19/13 0003 01/19/13 0457 01/19/13 0737  GLUCAP 80 93 106* 96 92    Recent Results (from the past 240 hour(s))  URINE CULTURE     Status: None   Collection Time    01/15/13  9:57 AM      Result Value Range Status   Specimen Description URINE, CATHETERIZED   Final   Special Requests NONE   Final   Culture  Setup Time     Final   Value: 01/15/2013 13:54     Performed at [facility] Count     Final   Value: NO GROWTH     Performed at [facility]   Culture     Final   Value: NO GROWTH      Performed at [facility]   Report Status 01/16/2013 FINAL   Final  CULTURE, BLOOD (ROUTINE X 2)     Status: None   Collection Time    01/15/13 10:05 AM      Result Value Range Status   Specimen Description BLOOD LEFT ANTECUBITAL   Final   Special Requests BOTTLES DRAWN AEROBIC AND ANAEROBIC EA   Final   Culture  Setup Time     Final   Value: 01/15/2013 13:52     Performed at [facility]   Culture     Final   Value:        BLOOD CULTURE RECEIVED NO GROWTH TO DATE CULTURE WILL BE HELD FOR 5 DAYS BEFORE ISSUING A FINAL NEGATIVE REPORT     Performed at [facility]   Report Status PENDING   Incomplete  CULTURE, BLOOD (ROUTINE X 2)     Status: None   Collection Time    01/15/13 10:40 AM      Result Value Range Status   Specimen Description BLOOD RIGHT ANTECUBITAL   Final   Special Requests BOTTLES DRAWN AEROBIC AND ANAEROBIC EA   Final   Culture  Setup Time     Final   Value: 01/15/2013 13:52     Performed at [facility]   Culture     Final   Value:        BLOOD CULTURE RECEIVED NO GROWTH TO DATE CULTURE WILL BE HELD FOR 5 DAYS BEFORE ISSUING A FINAL NEGATIVE REPORT     Performed at [facility]   Report Status PENDING   Incomplete  MRSA PCR SCREENING     Status: Abnormal   Collection Time    01/15/13  1:31 PM      Result Value Range Status   MRSA by PCR POSITIVE (*) NEGATIVE Final   Comment:            The GeneXpert MRSA Assay (FDA     approved for NASAL specimens     only), is one component of a     comprehensive MRSA colonization     surveillance program. It is not     intended to diagnose MRSA     infection nor to guide or     monitor treatment for     MRSA infections.     RESULT CALLED TO, READ BACK BY AND VERIFIED WITH:  RUSSELL,S. AT 1642 ON 10.20.14 BY LOVE,T.  CLOSTRIDIUM DIFFICILE BY PCR     Status: None   Collection Time    01/15/13  3:25 PM      Result Value Range Status   C difficile by pcr NEGATIVE   NEGATIVE Final   Comment: Performed at [facility]  STOOL CULTURE     Status: None   Collection Time    01/15/13  3:25 PM      Result Value Range Status   Specimen Description STOOL   Final   Special Requests Normal   Final   Culture     Final   Value: NO SALMONELLA, SHIGELLA, CAMPYLOBACTER, YERSINIA, OR E.COLI 0157:H7 ISOLATED     Performed at [facility]   Report Status 01/19/2013 FINAL   Final  URINE CULTURE     Status: None   Collection Time    01/15/13  4:41 PM      Result Value Range Status   Specimen Description URINE, CLEAN CATCH   Final   Special Requests NONE   Final   Culture  Setup Time     Final   Value: 01/16/2013 03:13     Performed at [facility] Count     Final   Value: NO GROWTH     Performed at [facility]   Culture     Final   Value: NO GROWTH     Performed at [facility]   Report Status 01/17/2013 FINAL   Final     Studies: Dg Chest Port 1 View  01/18/2013   CLINICAL DATA:  Pneumonia.  EXAM: PORTABLE CHEST - 1 VIEW  COMPARISON:  01/16/2013  FINDINGS: Central venous catheter is in lower SVC region and unchanged. Stable appearance of the right chest mass. Slightly improved aeration of the lungs compared to the previous examination. Again noted are coarse interstitial lung markings which are unchanged. Few densities at the left lung base are most compatible with atelectasis. Heart and mediastinum are stable.  IMPRESSION: No change in appearance of the large right lung mass.  Left basilar densities are most compatible with atelectasis.   Electronically Signed   By: Richarda Overlie M.D.   On: 01/18/2013 07:22    Scheduled Meds: . aztreonam  1 g Intravenous Q8H  . Chlorhexidine Gluconate Cloth  6 each Topical Q0600  . enoxaparin (LOVENOX) injection  40 mg Subcutaneous Q24H  . famotidine  20 mg Oral QHS  . insulin aspart  2-6 Units Subcutaneous Q4H  . lactose free nutrition  237 mL Oral BID AC & HS  .  levothyroxine  88 mcg Oral QAC breakfast  . magnesium sulfate 1 - 4 g bolus IVPB  2 g Intravenous Once  . mupirocin ointment  1 application Nasal BID  . potassium chloride  40 mEq Oral Q4H  . vancomycin  750 mg Intravenous Q12H   Continuous Infusions: . sodium chloride 100 mL/hr at 01/18/13 0308    Active Problems:   Non-small cell carcinoma of lung   Severe sepsis with septic shock   Diarrhea   PNA (pneumonia)   Hypokalemia   Anemia   Hypomagnesemia    Time spent: 45 minutes    HERNANDEZ Espinoza,Peggy  [facility] Pager [phone]  If 7PM-7AM, please contact night-coverage at [email or web address], password [facility] 01/19/2013, 12:29 PM  LOS: 4 days

## 2013-01-19 NOTE — Progress Notes (Signed)
Clinical Social Work Department BRIEF PSYCHOSOCIAL ASSESSMENT 01/19/2013  Patient:  Peggy Espinoza, Peggy Espinoza     Account Number:  [id number]     Admit date:  01/15/2013  Clinical Social Worker:  Jacelyn Grip  Date/Time:  01/19/2013 04:01 PM  Referred by:  Physician  Date Referred:  01/19/2013 Referred for  SNF Placement   Other Referral:   Interview type:  Family Other interview type:    PSYCHOSOCIAL DATA Living Status:  ALONE Admitted from facility:   Level of care:   Primary support name:  Peggy Espinoza/daughter/[phone] Primary support relationship to patient:  CHILD, ADULT Degree of support available:   strong    CURRENT CONCERNS Current Concerns  Post-Acute Placement   Other Concerns:    SOCIAL WORK ASSESSMENT / PLAN CSW reviewed chart and noted that PT/OT recommending SNF.    CSW met with pt daughter alone as pt sleeping at this time and per RN, disoriented at this time.    CSW introduced self and explained role. CSW discussed recommendation for SNF. Pt daughter interested in exploring [facility] SNF facilities. Pt daughter reports that pt lives in [place], but feels [place] will be better setting for pt as pt daughter lives in [place].    CSW provided emotional support to pt daughter as she discussed the many obstacles her mother has faced and discussed that pt disorientation is not baseline for her mother.    CSW completed FL2 and initiated SNF search to [facility].    CSW submitted pt clinicals to [employer]. Pt insurance requires authorization prior to pt d/c to SNF.    CSW to follow up with pt daughter re: bed offers.    CSW to continue to follow and facilitate pt discharge needs when pt medically ready for discharge and insurance authorization received.   Assessment/plan status:  Psychosocial Support/Ongoing Assessment of Needs Other assessment/ plan:   discharge planning   Information/referral to community resources:    [facility] list    PATIENT'S/FAMILY'S RESPONSE TO PLAN OF CARE: Per RN, pt disoriented at this time. Pt daughter is supportive and actively involved in pt care. Pt daughter hopes to find facility within [facility].     Peggy Espinoza, MSW, LCSWA  Clinical Social Work [phone]

## 2013-01-19 NOTE — Progress Notes (Signed)
Paged NP Craige Cotta on call about K+ 3.0 and corrected Ca+ of 11.9 (ujsed last albumin result). New orders received for K+ replacement. Left note for rounding MD about Ca+.

## 2013-01-19 NOTE — Evaluation (Signed)
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 She has been followed by Dr Gwenyth Bouillon and Dr Mitzi Hansen. Underwent first chemo on 10/13 (Taxol & carboplatin) and her first round of XRT on 10/17.  Admitted on 10/20 w/ hypotension, dehydration, and AMS after several days of nausea, vomiting and diarrhea (first noted on 10/16).    Clinical Impression   Pt requires +2 for safety with all ADL tasks and is impulsive at times. Feel pt will need SNF at d/c unless able to progress to a level that is safe to d/c home with family. She will benefit from skilled OT services to maximize ADL independence.     OT Assessment  Patient needs continued OT Services    Follow Up Recommendations  SNF;Supervision/Assistance - 24 hour    Barriers to Discharge      Equipment Recommendations  3 in 1 bedside comode    Recommendations for Other Services    Frequency  Min 2X/week    Precautions / Restrictions Precautions Precautions: Fall Restrictions Weight Bearing Restrictions: No   Pertinent Vitals/Pain No complaint of    ADL  Grooming: Wash/dry hands;Set up;Supervision/safety Where Assessed - Grooming: Supported sitting Upper Body Bathing: Chest;Right arm;Left arm;Abdomen;Minimal assistance (due lines/tubes) Where Assessed - Upper Body Bathing: Unsupported sitting Lower Body Bathing: +2 Total assistance Lower Body Bathing: Patient Percentage: 40% Where Assessed - Lower Body Bathing: Supported sit to stand Upper Body Dressing: Moderate assistance Where Assessed - Upper Body Dressing: Unsupported sitting Lower Body Dressing: +2 Total assistance Lower Body Dressing: Patient Percentage: 40% Where Assessed - Lower Body  Dressing: Supported sit to stand Toilet Transfer: +2 Total assistance Toilet Transfer: Patient Percentage: 60% Toilet Transfer Method: Surveyor, minerals: Materials engineer and Hygiene: +2 Total assistance (for thoroughness) Toileting - Clothing Manipulation and Hygiene: Patient Percentage: 50% Where Assessed - Toileting Clothing Manipulation and Hygiene: Sit to stand from 3-in-1 or toilet ADL Comments: Granddaugther present for session and states pt has needed more assist in the last few weeks and has gotten weaker more fatigued PTA. Pt needing to use BSC and was impulsive with standing and trying to step around to [facility] before OT secured IV pole, etc. Pt is also unsteady with taking steps backward from [facility] to chair, needing +2 for safety with lines/tubes. Pt not oriented to place today.     OT Diagnosis: Generalized weakness  OT Problem List: Decreased strength;Decreased activity tolerance;Decreased knowledge of use of DME or AE;Decreased cognition OT Treatment Interventions: Self-care/ADL training;DME and/or AE instruction;Therapeutic activities;Patient/family education   OT Goals(Current goals can be found in the care plan section) Acute Rehab OT Goals Patient Stated Goal: none stated OT Goal Formulation: With patient Time For Goal Achievement: 02/02/13 Potential to Achieve Goals: Good  Visit Information  Last OT Received On: 01/19/13 Assistance Needed: +2 History of Present Illness: Pt is an 79 Y/O F w/ 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 She has been followed by Dr Gwenyth Bouillon and Dr Mitzi Hansen. Underwent first chemo on 10/13 (Taxol & carboplatin) and her first round of XRT on 10/17.  Admitted on 10/20 w/ hypotension, dehydration, and AMS after several days of nausea, vomiting and diarrhea (  first noted on 10/16).        Prior Functioning     Home Living Family/patient expects to be discharged to::  Private residence Living Arrangements: Alone Available Help at Discharge: Family;Available 24 hours/day (pt reports daughter is able to stay with pt 24/7 upon d/c.) Type of Home: House Home Access: Stairs to enter [employer] of Steps: 5 Entrance Stairs-Rails: Can reach both Home Layout: One level Home Equipment: Walker - standard;Cane - single point;Shower seat;Grab bars - tub/shower Additional Comments: per granddaugther was requiring more assist with household tasks and even some personal care for several weeks. Prior Function Level of Independence: Independent Comments: pt reports she has assistive devices at home but was able to ambulate ind Communication Communication: Other (comment) (pt reports having difficulty with word finding)         Vision/Perception     Cognition  Cognition Arousal/Alertness: Awake/alert Behavior During Therapy: WFL for tasks assessed/performed Overall Cognitive Status: Impaired/Different from baseline Area of Impairment: Orientation;Safety/judgement Orientation Level: Place Safety/Judgement: Decreased awareness of safety;Decreased awareness of deficits    Extremity/Trunk Assessment Upper Extremity Assessment Upper Extremity Assessment: Overall WFL for tasks assessed     Mobility Bed Mobility Bed Mobility: Rolling Left;Left Sidelying to Sit Rolling Left: 4: Min assist;With rail Right Sidelying to Sit: 3: Mod assist;HOB elevated Sitting - Scoot to Edge of Bed: 4: Min assist Details for Bed Mobility Assistance: assist to bring trunk to upright. Verbal cues for self assist.  Transfers Transfers: Sit to Stand;Stand to Sit Sit to Stand: 1: +2 Total assist;With upper extremity assist;From bed;From chair/3-in-1 Sit to Stand: Patient Percentage: 70% Stand to Sit: 1: +2 Total assist;With upper extremity assist;To chair/3-in-1 Stand to Sit: Patient Percentage: 70% Details for Transfer Assistance: verbal cues for safety. +2 for lines and  safety. Pt needs assist to rise and steady and control descent.      Exercise     Balance     End of Session OT - End of Session Equipment Utilized During Treatment: Gait belt Activity Tolerance: Patient limited by fatigue Patient left: in chair;with call bell/phone within reach  GO     Lennox Laity [phone] 01/19/2013, 1:12 PM

## 2013-01-20 LAB — GLUCOSE, CAPILLARY
Glucose-Capillary: 102 mg/dL — ABNORMAL HIGH (ref 70–99)
Glucose-Capillary: 108 mg/dL — ABNORMAL HIGH (ref 70–99)
Glucose-Capillary: 110 mg/dL — ABNORMAL HIGH (ref 70–99)
Glucose-Capillary: 163 mg/dL — ABNORMAL HIGH (ref 70–99)

## 2013-01-20 LAB — MAGNESIUM: Magnesium: 1.5 mg/dL (ref 1.5–2.5)

## 2013-01-20 LAB — CBC
MCH: 28.3 pg (ref 26.0–34.0)
MCV: 88.6 fL (ref 78.0–100.0)
Platelets: 374 10*3/uL (ref 150–400)
RBC: 3.07 MIL/uL — ABNORMAL LOW (ref 3.87–5.11)
RDW: 14.4 % (ref 11.5–15.5)
WBC: 7.5 10*3/uL (ref 4.0–10.5)

## 2013-01-20 LAB — BASIC METABOLIC PANEL
CO2: 32 mEq/L (ref 19–32)
Calcium: 10 mg/dL (ref 8.4–10.5)
Chloride: 99 mEq/L (ref 96–112)
Creatinine, Ser: 0.45 mg/dL — ABNORMAL LOW (ref 0.50–1.10)
GFR calc non Af Amer: >90 mL/min (ref >90)
Sodium: 137 mEq/L (ref 135–145)

## 2013-01-20 MED ORDER — AMOXICILLIN-POT CLAVULANATE 875-125 MG PO TABS
1.0000 | ORAL_TABLET | Freq: Two times a day (BID) | ORAL | Status: DC
  Administered 2013-01-20 – 2013-01-22 (×5): 1 via ORAL
  Filled 2013-01-20 (×6): qty 1

## 2013-01-20 MED ORDER — POTASSIUM CHLORIDE CRYS ER 20 MEQ PO TBCR
40.0000 meq | EXTENDED_RELEASE_TABLET | Freq: Once | ORAL | Status: AC
  Administered 2013-01-20: 40 meq via ORAL
  Filled 2013-01-20: qty 2

## 2013-01-20 MED ORDER — MAGNESIUM OXIDE 400 (241.3 MG) MG PO TABS
400.0000 mg | ORAL_TABLET | Freq: Two times a day (BID) | ORAL | Status: DC
  Administered 2013-01-20 – 2013-01-22 (×5): 400 mg via ORAL
  Filled 2013-01-20 (×6): qty 1

## 2013-01-20 MED ORDER — GUAIFENESIN ER 600 MG PO TB12
1200.0000 mg | ORAL_TABLET | Freq: Two times a day (BID) | ORAL | Status: DC
  Administered 2013-01-20 – 2013-01-22 (×5): 1200 mg via ORAL
  Filled 2013-01-20 (×6): qty 2

## 2013-01-20 MED ORDER — BENZONATATE 100 MG PO CAPS
100.0000 mg | ORAL_CAPSULE | Freq: Three times a day (TID) | ORAL | Status: DC | PRN
  Filled 2013-01-20: qty 1

## 2013-01-20 NOTE — Plan of Care (Signed)
Problem: Phase I Progression Outcomes Goal: Tolerating diet Outcome: Not Met (add Reason) Ate only few bites of supper tray on 10/24

## 2013-01-20 NOTE — Progress Notes (Signed)
Provided daughter with bed offers.  Daughter hopeful for [facility]; they have yet to respond.  Weekday CSW to follow.  Providence Crosby, LCSWA Clinical Social Work [phone]

## 2013-01-20 NOTE — Progress Notes (Signed)
[facility] PROGRESS NOTE  Peggy Espinoza:[id number] DOB: [birth year] DOA: 01/15/2013 PCP: Kirk Ruths, MD  Assessment/Plan: Shock -Resolved. -Presumed related to sepsis from PNA as well as a component of hypovolemia from GI losses.  Post-obstructive PNA -will transition to PO abx today :augmentin. -All cx data remains negative to date. -Continues to have some chest wall pain related to PNA/ATX. -Mucinex BID ordered.  Hypokalemia -From GI losses. -Replete PO.  Hypomagnesemia -Replete PO. -Recheck in am.  N/v/diarrhea -Seems to have mostly resolved at this point. -Likely related to chemo, altho infectious causes not ruled out. -Stool cx remains negative.  Hypothyroidism -Continue synthroid.  Anemia -Presumed related to chemotherapy. -No indication for transfusion at present. -Continue to monitor Hb.  Non Small Cell Lung Cancer -Hold chemo/radiation while acutely ill. -Follow up with onc/rad onc as an OP.  DVT Prophylaxis -Lovenox  Code Status: Full Code Family Communication: Patient only  Disposition Plan: PT recs: 24 hour care vs SNF.   Consultants:  None   Antibiotics:  Vancomycin  Aztreonam   Subjective: Still with cough and chest pain.  Objective: Filed Vitals:   01/20/13 0410 01/20/13 0449 01/20/13 0450 01/20/13 0542  BP: 111/62 111/62    Pulse: 109 109 99 106  Temp: 98.2 F (36.8 C) 98.2 F (36.8 C)    TempSrc: Oral Oral    Resp: 20 20    Height:      Weight:  77.6 kg (171 lb 1.2 oz)    SpO2: 96% 96% 91% 97%    Intake/Output Summary (Last 24 hours) at 01/20/13 1140 Last data filed at 01/20/13 0542  Gross per 24 hour  Intake 3214.34 ml  Output    300 ml  Net 2914.34 ml   Filed Weights   01/17/13 0500 01/18/13 0051 01/20/13 0449  Weight: 73.5 kg (162 lb 0.6 oz) 75.5 kg (166 lb 7.2 oz) 77.6 kg (171 lb 1.2 oz)    Exam:   General:  AA Ox3  Cardiovascular: RRR  Respiratory: Bilateral ronchi  Abdomen:  S/NT/ND/+BS  Extremities: no C/C/E   Neurologic:  Grossly intact and non-focal.  Data Reviewed: Basic Metabolic Panel:  Recent Labs Lab 01/16/13 2105 01/17/13 0550 01/18/13 0400 01/19/13 0449 01/20/13 0450  NA 128* 130* 135 135 137  K 3.7 3.4* 3.2* 3.0* 3.4*  CL 99 99 102 98 99  CO2 20 22 27 28  32  GLUCOSE 114* 112* 102* 100* 111*  BUN 11 9 9 7 7   CREATININE 0.40* 0.42* 0.44* 0.40* 0.45*  CALCIUM 9.8 10.4 10.7* 10.4 10.0  MG  --   --  1.3*  --  1.5   Liver Function Tests:  Recent Labs Lab 01/15/13 0947 01/16/13 0440 01/17/13 0550  AST 27 21 24   ALT 13 10 15   ALKPHOS 92 73 90  BILITOT 0.9 0.6 0.4  PROT 7.2 5.6* 5.8*  ALBUMIN 2.9* 2.1* 2.1*   No results found for this basename: LIPASE, AMYLASE,  in the last 168 hours No results found for this basename: AMMONIA,  in the last 168 hours CBC:  Recent Labs Lab 01/15/13 0947  01/16/13 0440 01/17/13 0550 01/18/13 0530 01/19/13 0449 01/20/13 0450  WBC 15.7*  --  13.8* 11.6* 7.9 8.9 7.5  NEUTROABS 13.6*  --   --   --   --   --   --   HGB 9.6*  < > 8.5* 8.1* 8.3* 8.5* 8.7*  HCT 28.7*  < > 25.5* 24.6* 25.0* 25.2* 27.2*  MCV  84.9  --  85.9 85.4 86.2 86.6 88.6  PLT 394  --  335 326 352 377 374  < > = values in this interval not displayed. Cardiac Enzymes:  Recent Labs Lab 01/15/13 1300  TROPONINI <0.30   BNP (last 3 results) No results found for this basename: PROBNP,  in the last 8760 hours CBG:  Recent Labs Lab 01/19/13 1739 01/19/13 2111 01/20/13 0001 01/20/13 0407 01/20/13 0738  GLUCAP 115* 117* 163* 105* 110*    Recent Results (from the past 240 hour(s))  URINE CULTURE     Status: None   Collection Time    01/15/13  9:57 AM      Result Value Range Status   Specimen Description URINE, CATHETERIZED   Final   Special Requests NONE   Final   Culture  Setup Time     Final   Value: 01/15/2013 13:54     Performed at [facility] Count     Final   Value: NO GROWTH      Performed at [facility]   Culture     Final   Value: NO GROWTH     Performed at [facility]   Report Status 01/16/2013 FINAL   Final  CULTURE, BLOOD (ROUTINE X 2)     Status: None   Collection Time    01/15/13 10:05 AM      Result Value Range Status   Specimen Description BLOOD LEFT ANTECUBITAL   Final   Special Requests BOTTLES DRAWN AEROBIC AND ANAEROBIC EA   Final   Culture  Setup Time     Final   Value: 01/15/2013 13:52     Performed at [facility]   Culture     Final   Value:        BLOOD CULTURE RECEIVED NO GROWTH TO DATE CULTURE WILL BE HELD FOR 5 DAYS BEFORE ISSUING A FINAL NEGATIVE REPORT     Performed at [facility]   Report Status PENDING   Incomplete  CULTURE, BLOOD (ROUTINE X 2)     Status: None   Collection Time    01/15/13 10:40 AM      Result Value Range Status   Specimen Description BLOOD RIGHT ANTECUBITAL   Final   Special Requests BOTTLES DRAWN AEROBIC AND ANAEROBIC EA   Final   Culture  Setup Time     Final   Value: 01/15/2013 13:52     Performed at [facility]   Culture     Final   Value:        BLOOD CULTURE RECEIVED NO GROWTH TO DATE CULTURE WILL BE HELD FOR 5 DAYS BEFORE ISSUING A FINAL NEGATIVE REPORT     Performed at [facility]   Report Status PENDING   Incomplete  MRSA PCR SCREENING     Status: Abnormal   Collection Time    01/15/13  1:31 PM      Result Value Range Status   MRSA by PCR POSITIVE (*) NEGATIVE Final   Comment:            The GeneXpert MRSA Assay (FDA     approved for NASAL specimens     only), is one component of a     comprehensive MRSA colonization     surveillance program. It is not     intended to diagnose MRSA     infection nor to guide or  monitor treatment for     MRSA infections.     RESULT CALLED TO, READ BACK BY AND VERIFIED WITH:     RUSSELL,S. AT 1642 ON 10.20.14 BY LOVE,T.  CLOSTRIDIUM DIFFICILE BY PCR     Status: None   Collection Time    01/15/13   3:25 PM      Result Value Range Status   C difficile by pcr NEGATIVE  NEGATIVE Final   Comment: Performed at [facility]  STOOL CULTURE     Status: None   Collection Time    01/15/13  3:25 PM      Result Value Range Status   Specimen Description STOOL   Final   Special Requests Normal   Final   Culture     Final   Value: NO SALMONELLA, SHIGELLA, CAMPYLOBACTER, YERSINIA, OR E.COLI 0157:H7 ISOLATED     Performed at [facility]   Report Status 01/19/2013 FINAL   Final  URINE CULTURE     Status: None   Collection Time    01/15/13  4:41 PM      Result Value Range Status   Specimen Description URINE, CLEAN CATCH   Final   Special Requests NONE   Final   Culture  Setup Time     Final   Value: 01/16/2013 03:13     Performed at [facility] Count     Final   Value: NO GROWTH     Performed at [facility]   Culture     Final   Value: NO GROWTH     Performed at [facility]   Report Status 01/17/2013 FINAL   Final     Studies: No results found.  Scheduled Meds: . amoxicillin-clavulanate  1 tablet Oral Q12H  . Chlorhexidine Gluconate Cloth  6 each Topical Q0600  . enoxaparin (LOVENOX) injection  40 mg Subcutaneous Q24H  . famotidine  20 mg Oral QHS  . guaiFENesin  1,200 mg Oral BID  . insulin aspart  2-6 Units Subcutaneous Q4H  . lactose free nutrition  237 mL Oral BID AC & HS  . levothyroxine  88 mcg Oral QAC breakfast  . mupirocin ointment  1 application Nasal BID   Continuous Infusions: . sodium chloride 1,000 mL (01/20/13 0541)    Active Problems:   Non-small cell carcinoma of lung   Severe sepsis with septic shock   Diarrhea   PNA (pneumonia)   Hypokalemia   Anemia   Hypomagnesemia    Time spent: 45 minutes    HERNANDEZ ACOSTA,ESTELA  [facility] Pager [phone]  If 7PM-7AM, please contact night-coverage at [email or web address], password [facility] 01/20/2013, 11:40 AM  LOS: 5 days

## 2013-01-21 LAB — CULTURE, BLOOD (ROUTINE X 2)
Culture: NO GROWTH
Culture: NO GROWTH

## 2013-01-21 LAB — BASIC METABOLIC PANEL
BUN: 4 mg/dL — ABNORMAL LOW (ref 6–23)
Calcium: 9.7 mg/dL (ref 8.4–10.5)
Chloride: 97 mEq/L (ref 96–112)
Creatinine, Ser: 0.39 mg/dL — ABNORMAL LOW (ref 0.50–1.10)
GFR calc Af Amer: >90 mL/min (ref >90)
GFR calc non Af Amer: >90 mL/min (ref >90)
Glucose, Bld: 103 mg/dL — ABNORMAL HIGH (ref 70–99)

## 2013-01-21 LAB — GLUCOSE, CAPILLARY
Glucose-Capillary: 108 mg/dL — ABNORMAL HIGH (ref 70–99)
Glucose-Capillary: 115 mg/dL — ABNORMAL HIGH (ref 70–99)
Glucose-Capillary: 117 mg/dL — ABNORMAL HIGH (ref 70–99)
Glucose-Capillary: 94 mg/dL (ref 70–99)
Glucose-Capillary: 99 mg/dL (ref 70–99)

## 2013-01-21 LAB — CBC
MCHC: 31.7 g/dL (ref 30.0–36.0)
Platelets: 368 10*3/uL (ref 150–400)
RDW: 14.5 % (ref 11.5–15.5)
WBC: 6 10*3/uL (ref 4.0–10.5)

## 2013-01-21 NOTE — Progress Notes (Signed)
[facility] PROGRESS NOTE  Peggy Espinoza EAV:[id number] DOB: [birth year] DOA: 01/15/2013 PCP: Kirk Ruths, MD  Assessment/Plan: Shock -Resolved. -Presumed related to sepsis from PNA as well as a component of hypovolemia from GI losses.  Post-obstructive PNA -will transition to PO abx today :augmentin. -All cx data remains negative to date. -Continues to have some chest wall pain related to PNA/ATX. -Mucinex BID ordered.  Hypokalemia -From GI losses. -Replete PO.  Hypomagnesemia -Replete PO. -Recheck in am.  N/v/diarrhea -Seems to have mostly resolved at this point. -Likely related to chemo, altho infectious causes not ruled out. -Stool cx remains negative.  Hypothyroidism -Continue synthroid.  Anemia -Presumed related to chemotherapy. -No indication for transfusion at present. -Continue to monitor Hb.  Non Small Cell Lung Cancer -Hold chemo/radiation while acutely ill. -Follow up with onc/rad onc as an OP.  DVT Prophylaxis -Lovenox  Code Status: Full Code Family Communication: Patient only  Disposition Plan: SNF   Consultants:  None   Antibiotics: Augmentin  Subjective: Still with cough and chest pain. Altho cough much improved from yesterday.  Objective: Filed Vitals:   01/20/13 0542 01/20/13 1439 01/20/13 2058 01/21/13 0500  BP:  120/55 119/66 136/75  Pulse: 106 98 97 94  Temp:  98 F (36.7 C) 98 F (36.7 C) 97.5 F (36.4 C)  TempSrc:  Oral Oral Oral  Resp:  16 16 20   Height:      Weight:      SpO2: 97% 98% 99% 100%    Intake/Output Summary (Last 24 hours) at 01/21/13 1005 Last data filed at 01/21/13 0030  Gross per 24 hour  Intake    120 ml  Output   1050 ml  Net   -930 ml   Filed Weights   01/17/13 0500 01/18/13 0051 01/20/13 0449  Weight: 73.5 kg (162 lb 0.6 oz) 75.5 kg (166 lb 7.2 oz) 77.6 kg (171 lb 1.2 oz)    Exam:   General:  AA Ox3  Cardiovascular: RRR  Respiratory: Bilateral ronchi  Abdomen:  S/NT/ND/+BS  Extremities: no C/C/E   Neurologic:  Grossly intact and non-focal.  Data Reviewed: Basic Metabolic Panel:  Recent Labs Lab 01/17/13 0550 01/18/13 0400 01/19/13 0449 01/20/13 0450 01/21/13 0500  NA 130* 135 135 137 136  K 3.4* 3.2* 3.0* 3.4* 3.3*  CL 99 102 98 99 97  CO2 22 27 28  32 35*  GLUCOSE 112* 102* 100* 111* 103*  BUN 9 9 7 7  4*  CREATININE 0.42* 0.44* 0.40* 0.45* 0.39*  CALCIUM 10.4 10.7* 10.4 10.0 9.7  MG  --  1.3*  --  1.5  --    Liver Function Tests:  Recent Labs Lab 01/15/13 0947 01/16/13 0440 01/17/13 0550  AST 27 21 24   ALT 13 10 15   ALKPHOS 92 73 90  BILITOT 0.9 0.6 0.4  PROT 7.2 5.6* 5.8*  ALBUMIN 2.9* 2.1* 2.1*   No results found for this basename: LIPASE, AMYLASE,  in the last 168 hours No results found for this basename: AMMONIA,  in the last 168 hours CBC:  Recent Labs Lab 01/15/13 0947  01/17/13 0550 01/18/13 0530 01/19/13 0449 01/20/13 0450 01/21/13 0500  WBC 15.7*  < > 11.6* 7.9 8.9 7.5 6.0  NEUTROABS 13.6*  --   --   --   --   --   --   HGB 9.6*  < > 8.1* 8.3* 8.5* 8.7* 8.4*  HCT 28.7*  < > 24.6* 25.0* 25.2* 27.2* 26.5*  MCV 84.9  < >  85.4 86.2 86.6 88.6 89.8  PLT 394  < > 326 352 377 374 368  < > = values in this interval not displayed. Cardiac Enzymes:  Recent Labs Lab 01/15/13 1300  TROPONINI <0.30   BNP (last 3 results) No results found for this basename: PROBNP,  in the last 8760 hours CBG:  Recent Labs Lab 01/20/13 1636 01/20/13 2057 01/21/13 0042 01/21/13 0357 01/21/13 0720  GLUCAP 102* 143* 99 94 102*    Recent Results (from the past 240 hour(s))  URINE CULTURE     Status: None   Collection Time    01/15/13  9:57 AM      Result Value Range Status   Specimen Description URINE, CATHETERIZED   Final   Special Requests NONE   Final   Culture  Setup Time     Final   Value: 01/15/2013 13:54     Performed at [facility] Count     Final   Value: NO GROWTH     Performed at  [facility]   Culture     Final   Value: NO GROWTH     Performed at [facility]   Report Status 01/16/2013 FINAL   Final  CULTURE, BLOOD (ROUTINE X 2)     Status: None   Collection Time    01/15/13 10:05 AM      Result Value Range Status   Specimen Description BLOOD LEFT ANTECUBITAL   Final   Special Requests BOTTLES DRAWN AEROBIC AND ANAEROBIC EA   Final   Culture  Setup Time     Final   Value: 01/15/2013 13:52     Performed at [facility]   Culture     Final   Value: NO GROWTH 5 DAYS     Performed at [facility]   Report Status 01/21/2013 FINAL   Final  CULTURE, BLOOD (ROUTINE X 2)     Status: None   Collection Time    01/15/13 10:40 AM      Result Value Range Status   Specimen Description BLOOD RIGHT ANTECUBITAL   Final   Special Requests BOTTLES DRAWN AEROBIC AND ANAEROBIC EA   Final   Culture  Setup Time     Final   Value: 01/15/2013 13:52     Performed at [facility]   Culture     Final   Value: NO GROWTH 5 DAYS     Performed at [facility]   Report Status 01/21/2013 FINAL   Final  MRSA PCR SCREENING     Status: Abnormal   Collection Time    01/15/13  1:31 PM      Result Value Range Status   MRSA by PCR POSITIVE (*) NEGATIVE Final   Comment:            The GeneXpert MRSA Assay (FDA     approved for NASAL specimens     only), is one component of a     comprehensive MRSA colonization     surveillance program. It is not     intended to diagnose MRSA     infection nor to guide or     monitor treatment for     MRSA infections.     RESULT CALLED TO, READ BACK BY AND VERIFIED WITH:     RUSSELL,S. AT 1642 ON 10.20.14 BY LOVE,T.  CLOSTRIDIUM DIFFICILE BY PCR     Status: None   Collection  Time    01/15/13  3:25 PM      Result Value Range Status   C difficile by pcr NEGATIVE  NEGATIVE Final   Comment: Performed at [facility]  STOOL CULTURE     Status: None   Collection Time    01/15/13  3:25 PM       Result Value Range Status   Specimen Description STOOL   Final   Special Requests Normal   Final   Culture     Final   Value: NO SALMONELLA, SHIGELLA, CAMPYLOBACTER, YERSINIA, OR E.COLI 0157:H7 ISOLATED     Performed at [facility]   Report Status 01/19/2013 FINAL   Final  URINE CULTURE     Status: None   Collection Time    01/15/13  4:41 PM      Result Value Range Status   Specimen Description URINE, CLEAN CATCH   Final   Special Requests NONE   Final   Culture  Setup Time     Final   Value: 01/16/2013 03:13     Performed at [facility] Count     Final   Value: NO GROWTH     Performed at [facility]   Culture     Final   Value: NO GROWTH     Performed at [facility]   Report Status 01/17/2013 FINAL   Final     Studies: No results found.  Scheduled Meds: . amoxicillin-clavulanate  1 tablet Oral Q12H  . enoxaparin (LOVENOX) injection  40 mg Subcutaneous Q24H  . famotidine  20 mg Oral QHS  . guaiFENesin  1,200 mg Oral BID  . insulin aspart  2-6 Units Subcutaneous Q4H  . lactose free nutrition  237 mL Oral BID AC & HS  . levothyroxine  88 mcg Oral QAC breakfast  . magnesium oxide  400 mg Oral BID   Continuous Infusions: . sodium chloride 100 mL/hr at 01/20/13 1141    Active Problems:   Non-small cell carcinoma of lung   Severe sepsis with septic shock   Diarrhea   PNA (pneumonia)   Hypokalemia   Anemia   Hypomagnesemia    Time spent:  35 minutes    HERNANDEZ ACOSTA,ESTELA  [facility] Pager [phone]  If 7PM-7AM, please contact night-coverage at [email or web address], password [facility] 01/21/2013, 10:05 AM  LOS: 6 days

## 2013-01-21 NOTE — Progress Notes (Signed)
Talked w patient re: placing a new peripheral IV site & removin CL. Pt states the CL is not bothering her as much now & does not wish to be suck for new site.Hartley Barefoot

## 2013-01-22 ENCOUNTER — Ambulatory Visit: Payer: Medicare Other

## 2013-01-22 ENCOUNTER — Telehealth: Payer: Self-pay | Admitting: *Deleted

## 2013-01-22 ENCOUNTER — Encounter: Payer: Self-pay | Admitting: *Deleted

## 2013-01-22 ENCOUNTER — Other Ambulatory Visit: Payer: Medicare Other | Admitting: Lab

## 2013-01-22 LAB — GLUCOSE, CAPILLARY
Glucose-Capillary: 106 mg/dL — ABNORMAL HIGH (ref 70–99)
Glucose-Capillary: 108 mg/dL — ABNORMAL HIGH (ref 70–99)
Glucose-Capillary: 111 mg/dL — ABNORMAL HIGH (ref 70–99)
Glucose-Capillary: 139 mg/dL — ABNORMAL HIGH (ref 70–99)

## 2013-01-22 LAB — CREATININE, SERUM
Creatinine, Ser: 0.36 mg/dL — ABNORMAL LOW (ref 0.50–1.10)
GFR calc Af Amer: >90 mL/min (ref >90)
GFR calc non Af Amer: >90 mL/min (ref >90)

## 2013-01-22 MED ORDER — AMOXICILLIN-POT CLAVULANATE 875-125 MG PO TABS: 1.0000 | ORAL_TABLET | Freq: Two times a day (BID) | ORAL | Status: DC

## 2013-01-22 MED ORDER — POTASSIUM CHLORIDE CRYS ER 20 MEQ PO TBCR
40.0000 meq | EXTENDED_RELEASE_TABLET | Freq: Once | ORAL | Status: AC
  Administered 2013-01-22: 40 meq via ORAL
  Filled 2013-01-22: qty 2

## 2013-01-22 MED ORDER — GUAIFENESIN ER 600 MG PO TB12: 1200.0000 mg | ORAL_TABLET | Freq: Two times a day (BID) | ORAL | Status: DC

## 2013-01-22 MED ORDER — INSULIN ASPART 100 UNIT/ML ~~LOC~~ SOLN: 0.0000 [IU] | Freq: Every day | SUBCUTANEOUS | Status: DC

## 2013-01-22 MED ORDER — ACETAMINOPHEN 325 MG PO TABS: 650.0000 mg | ORAL_TABLET | Freq: Four times a day (QID) | ORAL | Status: DC | PRN

## 2013-01-22 MED ORDER — INSULIN ASPART 100 UNIT/ML ~~LOC~~ SOLN: 0.0000 [IU] | Freq: Three times a day (TID) | SUBCUTANEOUS | Status: DC

## 2013-01-22 MED ORDER — MAGNESIUM OXIDE 400 (241.3 MG) MG PO TABS: 400.0000 mg | ORAL_TABLET | Freq: Two times a day (BID) | ORAL | Status: DC

## 2013-01-22 NOTE — Discharge Summary (Signed)
Physician Discharge Summary  Peggy Espinoza GNF:[id number] DOB: 01-23-1930 DOA: 01/15/2013  PCP: Kirk Ruths, MD  Admit date: 01/15/2013 Discharge date: 01/22/2013  Time spent: 45 minutes  Recommendations for Outpatient Follow-up:  -Will be discharged to SNF today. -Patient and daughter and considering stopping chemo/radiation treatments all together. -Will need follow up with oncology in about 2 weeks to further discuss treatment for her lung cancer.   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 She has been followed by Dr Gwenyth Bouillon and Dr Mitzi Hansen. Underwent first chemo on 10/13 (Taxol & carboplatin) and her first round of XRT on 10/17. Admitted on 10/20 w/ hypotension, dehydration, and AMS after several days of nausea, vomiting and diarrhea (first noted on 10/16). She was initially admitted under the [facility] service and later transferred to [facility].   Hospital Course:   Shock  -Resolved.  -Presumed related to sepsis from PNA as well as a component of hypovolemia from GI losses.   Post-obstructive PNA  -Has been transitioned to PO augmentin to complete 4 more days on DC. -All cx data remains negative to date.  -Mucinex BID for cough.  Hypokalemia  -Repleted.  Hypomagnesemia  -Mg 1.5. -On mag oxide 100 mg TID for 7 days.   N/v/diarrhea  -Resolved. -Likely related to chemo, altho infectious causes not ruled out.  -Stool cx remains negative.   Hypothyroidism  -Continue synthroid.   Anemia  -Presumed related to chemotherapy.   -No indication for transfusion at present.  -Continue to monitor Hb.  -Hb is 8.4 on DC.  Non Small Cell Lung Cancer  -Hold chemo/radiation while acutely ill.  -Follow up with onc/rad onc as an OP.  -Patient and daughter considering stopping treatment all together.   Procedures:  None   Consultations:  CCM  Discharge Instructions  Discharge Orders   Future Appointments Provider Department Dept Phone   01/23/2013 9:30 AM [facility] [phone]   01/24/2013 9:30 AM [facility] [phone]   01/25/2013 9:30 AM [facility] [phone]   01/26/2013 9:30 AM [facility] [phone]   01/26/2013 9:45 AM Jonna Coup, MD [facility] [phone]   01/29/2013 9:30 AM [facility] [phone]   01/29/2013 11:15 AM [facility] [phone]   01/30/2013 9:30 AM [facility] [phone]   01/31/2013 9:30 AM [facility] [phone]   02/01/2013 9:30 AM [facility] [phone]   02/02/2013 9:30 AM [facility] [place] [facility] [phone]   02/05/2013 9:30 AM [facility] [phone]   02/05/2013 11:45 AM [facility] [place] [facility] [phone]   02/06/2013 9:30 AM [facility] [place] [phone]   02/07/2013 9:30 AM [facility] [place] [facility] [phone]   02/08/2013 9:30 AM [facility] [place] [facility] [phone]   02/09/2013 9:30 AM [facility] [phone]   02/12/2013 9:30 AM [facility] [place] [phone]   02/13/2013 9:30 AM [facility] [facility] [phone]   02/14/2013 9:30 AM [facility] [place] [facility] [facility] [phone]   02/15/2013 9:30 AM [facility] [place] [facility] [phone]   02/16/2013 9:30 AM [facility] [place] [facility] [phone]   02/19/2013 9:30 AM [facility] [phone]   02/20/2013 9:30 AM [facility] [phone]   02/21/2013 9:30 AM [facility] [place] [facility] [phone]   02/26/2013 9:30 AM [facility] [place] [facility] [phone]   02/27/2013 9:30 AM [facility] [phone]   02/28/2013 9:30 AM [facility] [place] [facility] [phone]   03/01/2013 9:30 AM [facility] [place] [facility] [phone]   03/02/2013 9:30 AM [facility] [phone]   03/05/2013 9:30 AM [facility] [phone]   Future Orders Complete By Expires   Discontinue IV  As directed    Increase activity slowly  As directed        Medication List    STOP taking these medications       diazepam 5 MG tablet  Commonly known as:  VALIUM     emollient cream  Commonly known as:  BIAFINE     metFORMIN 500 MG tablet  Commonly known as:  GLUCOPHAGE     naproxen sodium 220 MG tablet   Commonly known as:  ANAPROX     olmesartan-hydrochlorothiazide 20-12.5 MG per tablet  Commonly known as:  BENICAR HCT     PRESCRIPTION MEDICATION     prochlorperazine 10 MG tablet  Commonly known as:  COMPAZINE      TAKE these medications       acetaminophen 325 MG tablet  Commonly known as:  TYLENOL  Take 2 tablets (650 mg total) by mouth every 6 (six) hours as needed for fever.     amoxicillin-clavulanate 875-125 MG per tablet  Commonly known as:  AUGMENTIN  Take 1 tablet by mouth every 12 (twelve) hours. For 4 more days     donepezil 5 MG tablet  Commonly known as:  ARICEPT  Take 5 mg by mouth at bedtime.     esomeprazole 40 MG capsule  Commonly known as:  NEXIUM  Take 40 mg by mouth daily before breakfast.     ezetimibe 10 MG tablet  Commonly known as:  ZETIA  Take 10 mg by mouth daily.     ferrous sulfate 325 (65 FE) MG tablet  Take 325 mg by mouth 2 (two) times daily.     fluticasone 50 MCG/ACT nasal spray  Commonly known as:  FLONASE  Place 2 sprays into the nose daily as needed for rhinitis.     guaiFENesin 600 MG 12  hr tablet  Commonly known as:  MUCINEX  Take 2 tablets (1,200 mg total) by mouth 2 (two) times daily.     levothyroxine 88 MCG tablet  Commonly known as:  SYNTHROID, LEVOTHROID  Take 88 mcg by mouth daily before breakfast.     magnesium oxide 400 (241.3 MG) MG tablet  Commonly known as:  MAG-OX  Take 1 tablet (400 mg total) by mouth 2 (two) times daily. For 7 days     multivitamin tablet  Take 1 tablet by mouth daily.     rosuvastatin 5 MG tablet  Commonly known as:  CRESTOR  Take 5 mg by mouth at bedtime.     Vitamin D 2000 UNITS tablet  Take 2,000 Units by mouth daily.       Allergies  Allergen Reactions  . Bactrim [Sulfamethoxazole-Trimethoprim] Hives  . Ciprofloxacin Hives  . Penicillins Swelling    Per MD, not throat swelling  . Welchol [Colesevelam Hcl] Other (See Comments)    Body aches  . Zocor [Simvastatin] Other  (See Comments)    Muscle aches      The results of significant diagnostics from this hospitalization (including imaging, microbiology, ancillary and laboratory) are listed below for reference.    Significant Diagnostic Studies: Ct Abdomen Pelvis Wo Contrast  01/16/2013   CLINICAL DATA:  Abdominal sepsis.  Right lung mass.  EXAM: CT ABDOMEN AND PELVIS WITHOUT CONTRAST  TECHNIQUE: Multidetector CT imaging of the abdomen and pelvis was performed following the standard protocol without intravenous contrast.  COMPARISON:  Recent PET-CT on 11/16/2012 and chest CT on 11/01/2012  FINDINGS: Images through the lung bases show increased size of mass in the central right middle lobe and new small bilateral pleural effusions, right side greater than left. Large hiatal hernia is again demonstrated.  A tiny nonobstructing 1-2 mm calculus is noted in the midpole of the left kidney. There is no evidence of hydronephrosis. No evidence of ureteral calculi or dilatation. No bladder calculi identified. Mild symmetric perinephric stranding noted bilaterally. Noncontrast images of the liver, pancreas, spleen, and adrenal glands are normal in appearance.  A simple appearing cyst is seen in the right adnexa measuring 3 x 4 cm. This is stable compared to previous study, which showed absence of hypermetabolic activity. Prior hysterectomy noted. Severe sigmoid diverticulosis is again demonstrated, however there is no evidence of diverticulitis. No other acute inflammatory process or abscess identified within the abdomen or pelvis. No evidence of dilated bowel loops. No suspicious bone lesions identified.  IMPRESSION: No acute findings within the abdomen or pelvis.  Stable 4 cm benign appearing right adnexal cyst, sigmoid diverticulosis, and hiatal hernia.  Tiny nonobstructing left renal calculus. No evidence of hydronephrosis.  Increased size of right middle lobe lung mass and right greater than left pleural effusions.    Electronically Signed   By: Myles Rosenthal M.D.   On: 01/16/2013 16:29   Dg Chest Port 1 View  01/18/2013   CLINICAL DATA:  Pneumonia.  EXAM: PORTABLE CHEST - 1 VIEW  COMPARISON:  01/16/2013  FINDINGS: Central venous catheter is in lower SVC region and unchanged. Stable appearance of the right chest mass. Slightly improved aeration of the lungs compared to the previous examination. Again noted are coarse interstitial lung markings which are unchanged. Few densities at the left lung base are most compatible with atelectasis. Heart and mediastinum are stable.  IMPRESSION: No change in appearance of the large right lung mass.  Left basilar densities are most compatible with  atelectasis.   Electronically Signed   By: Richarda Overlie M.D.   On: 01/18/2013 07:22   Dg Chest Port 1 View  01/16/2013   CLINICAL DATA:  Pneumonia  EXAM: PORTABLE CHEST - 1 VIEW  COMPARISON:  01/15/2013  FINDINGS: Heart is normal size. Right internal jugular central venous catheter remains in the right atrium, unchanged. Continued rounded airspace opacity in the right lower lung, unchanged. Minimal left base atelectasis. Heart is normal size.  IMPRESSION: No significant change.   Electronically Signed   By: Charlett Nose M.D.   On: 01/16/2013 07:13   Dg Chest Port 1 View  01/15/2013   CLINICAL DATA:  Status post central line placement  EXAM: PORTABLE CHEST - 1 VIEW  COMPARISON:  01/15/2013 1044 hr  FINDINGS: The right jugular line has now been placed. The catheter tip is noted in the rib mid right atrium. No pneumothorax is seen. A persistent right-sided lung mass is seen. No new focal abnormality is noted.  IMPRESSION: No evidence of pneumothorax following central line placement.   Electronically Signed   By: Alcide Clever M.D.   On: 01/15/2013 12:18   Dg Chest Port 1 View  01/15/2013   CLINICAL DATA:  Lung cancer. Altered mental status. Hypertension.  EXAM: PORTABLE CHEST - 1 VIEW  COMPARISON:  12/14/2012  FINDINGS: The right middle/upper  lobe lung mass measures 7.8 cm craniocaudad, increased from 7.3 cm, although some of this increase may be due to the use of AP portable projection instead of the PA projection, which would tend to magnify the anteriorly located tumor compared to the prior exam.  Upper normal heart size. Stable left basilar scarring. Hiatal hernia noted. Tortuous thoracic aorta. There is some increase in bandlike opacity along the lateral margin of the right lung mass, favoring postobstructive atelectasis or pneumonitis.  Low lung volumes are present, causing crowding of the pulmonary vasculature.  IMPRESSION: 1. Increase in postobstructive atelectasis or pneumonitis associated with the mass involving the right middle lobe and right upper lobe. 2. Mass measures slightly greater craniocaudad than on the prior chest x-ray from last month, although some of this may be projectional (today's exam was in AP projection and last months exam was a PA projection) 3. Stable left basilar scarring. 4. Hiatal hernia. 5. Low lung volumes.   Electronically Signed   By: Herbie Baltimore M.D.   On: 01/15/2013 11:17    Microbiology: Recent Results (from the past 240 hour(s))  URINE CULTURE     Status: None   Collection Time    01/15/13  9:57 AM      Result Value Range Status   Specimen Description URINE, CATHETERIZED   Final   Special Requests NONE   Final   Culture  Setup Time     Final   Value: 01/15/2013 13:54     Performed at [facility] Count     Final   Value: NO GROWTH     Performed at [facility]   Culture     Final   Value: NO GROWTH     Performed at [facility]   Report Status 01/16/2013 FINAL   Final  CULTURE, BLOOD (ROUTINE X 2)     Status: None   Collection Time    01/15/13 10:05 AM      Result Value Range Status   Specimen Description BLOOD LEFT ANTECUBITAL   Final   Special Requests BOTTLES DRAWN AEROBIC AND ANAEROBIC EA  Final   Culture  Setup Time     Final   Value:  01/15/2013 13:52     Performed at [facility]   Culture     Final   Value: NO GROWTH 5 DAYS     Performed at [facility]   Report Status 01/21/2013 FINAL   Final  CULTURE, BLOOD (ROUTINE X 2)     Status: None   Collection Time    01/15/13 10:40 AM      Result Value Range Status   Specimen Description BLOOD RIGHT ANTECUBITAL   Final   Special Requests BOTTLES DRAWN AEROBIC AND ANAEROBIC EA   Final   Culture  Setup Time     Final   Value: 01/15/2013 13:52     Performed at [facility]   Culture     Final   Value: NO GROWTH 5 DAYS     Performed at [facility]   Report Status 01/21/2013 FINAL   Final  MRSA PCR SCREENING     Status: Abnormal   Collection Time    01/15/13  1:31 PM      Result Value Range Status   MRSA by PCR POSITIVE (*) NEGATIVE Final   Comment:            The GeneXpert MRSA Assay (FDA     approved for NASAL specimens     only), is one component of a     comprehensive MRSA colonization     surveillance program. It is not     intended to diagnose MRSA     infection nor to guide or     monitor treatment for     MRSA infections.     RESULT CALLED TO, READ BACK BY AND VERIFIED WITH:     RUSSELL,S. AT 1642 ON 10.20.14 BY LOVE,T.  CLOSTRIDIUM DIFFICILE BY PCR     Status: None   Collection Time    01/15/13  3:25 PM      Result Value Range Status   C difficile by pcr NEGATIVE  NEGATIVE Final   Comment: Performed at [facility]  STOOL CULTURE     Status: None   Collection Time    01/15/13  3:25 PM      Result Value Range Status   Specimen Description STOOL   Final   Special Requests Normal   Final   Culture     Final   Value: NO SALMONELLA, SHIGELLA, CAMPYLOBACTER, YERSINIA, OR E.COLI 0157:H7 ISOLATED     Performed at [facility]   Report Status 01/19/2013 FINAL   Final  URINE CULTURE     Status: None   Collection Time    01/15/13  4:41 PM      Result Value Range Status   Specimen Description URINE,  CLEAN CATCH   Final   Special Requests NONE   Final   Culture  Setup Time     Final   Value: 01/16/2013 03:13     Performed at [facility] Count     Final   Value: NO GROWTH     Performed at [facility]   Culture     Final   Value: NO GROWTH     Performed at [facility]   Report Status 01/17/2013 FINAL   Final     Labs: Basic Metabolic Panel:  Recent Labs Lab 01/17/13 0550 01/18/13 0400 01/19/13 0449 01/20/13 0450 01/21/13 0500  01/22/13 0523  NA 130* 135 135 137 136  --   K 3.4* 3.2* 3.0* 3.4* 3.3*  --   CL 99 102 98 99 97  --   CO2 22 27 28  32 35*  --   GLUCOSE 112* 102* 100* 111* 103*  --   BUN 9 9 7 7  4*  --   CREATININE 0.42* 0.44* 0.40* 0.45* 0.39* 0.36*  CALCIUM 10.4 10.7* 10.4 10.0 9.7  --   MG  --  1.3*  --  1.5  --   --    Liver Function Tests:  Recent Labs Lab 01/16/13 0440 01/17/13 0550  AST 21 24  ALT 10 15  ALKPHOS 73 90  BILITOT 0.6 0.4  PROT 5.6* 5.8*  ALBUMIN 2.1* 2.1*   No results found for this basename: LIPASE, AMYLASE,  in the last 168 hours No results found for this basename: AMMONIA,  in the last 168 hours CBC:  Recent Labs Lab 01/17/13 0550 01/18/13 0530 01/19/13 0449 01/20/13 0450 01/21/13 0500  WBC 11.6* 7.9 8.9 7.5 6.0  HGB 8.1* 8.3* 8.5* 8.7* 8.4*  HCT 24.6* 25.0* 25.2* 27.2* 26.5*  MCV 85.4 86.2 86.6 88.6 89.8  PLT 326 352 377 374 368   Cardiac Enzymes:  Recent Labs Lab 01/15/13 1300  TROPONINI <0.30   BNP: BNP (last 3 results) No results found for this basename: PROBNP,  in the last 8760 hours CBG:  Recent Labs Lab 01/21/13 1610 01/21/13 1958 01/22/13 0013 01/22/13 0405 01/22/13 0756  GLUCAP 108* 115* 108* 111* 106*       Signed:  HERNANDEZ ACOSTA,ESTELA  [facility] Pager: [phone] 01/22/2013, 12:29 PM

## 2013-01-22 NOTE — Telephone Encounter (Signed)
Called [phone] asked to speak with nurse taking care of Peggy Espinoza,.Brendel, per secretary, she called nurse'sRN,  Danford Bad, phone [phone], not answered, will try again,0815am- Called same number 20 min later no answer to that extension phone, 0830am Called 3rd time at 843am no answer

## 2013-01-22 NOTE — Progress Notes (Signed)
CSW received notification that pt family has chosen [facility].  CSW spoke with Dorann Lodge who confirmed that pt daughter at facility completing paperwork and facility can accept pt today.  CSW contacted pt insurance and left message in order for pt insurance to provide facility with SNF authorization.  CSW to facilitate pt discharge needs this afternoon.  Jacklynn Lewis, MSW, LCSWA  Clinical Social Work [phone]

## 2013-01-22 NOTE — Telephone Encounter (Signed)
Tried calling [phone] Lucilla Edin, phone again, no answer, called Aram Beecham, front dest 3E, who then transferred to [place],

## 2013-01-22 NOTE — Progress Notes (Signed)
Pt for discharge to [facility].  CSW facilitated pt discharge needs including contacting facility, faxing pt discharge information via [facility], discussing with pt and pt daughter at bedside, receiving insurance authorization for SNF and insurance authorization for ambulance transport from [employer], providing RN phone number to call report, and arranging ambulance transport for pt to [facility] (Service Request ID#: [id number]).  No further social work needs identified at this time.  CSW signing off.   Jacklynn Lewis, MSW, LCSWA  Clinical Social Work [phone]

## 2013-01-22 NOTE — Progress Notes (Signed)
Called and spoke with Rad Onc, Chemo room and lab regarding pt in hospital

## 2013-01-22 NOTE — Telephone Encounter (Signed)
Spoke with Peggy Espinoza, patient can be treated today, asked that she be medicvated for pain if in pain,we would be coming to get her for her 930am  15 min rad tx then, thanked her,called Ana on [facility], informed therapist patient to be treated today will come down in her bed 8:58 AM

## 2013-01-22 NOTE — Progress Notes (Signed)
Physical Therapy Treatment Patient Details Name: Peggy Espinoza MRN: [id number] DOB: 1930-03-04 Today's Date: 01/22/2013 Time: 4098-1191 PT Time Calculation (min): 25 min  PT Assessment / Plan / Recommendation  History of Present Illness Pt is an 79 Y/O F w/ 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 She has been followed by Dr Gwenyth Bouillon and Dr Mitzi Hansen. Underwent first chemo on 10/13 (Taxol & carboplatin) and her first round of XRT on 10/17.  Admitted on 10/20 w/ hypotension, dehydration, and AMS after several days of nausea, vomiting and diarrhea (first noted on 10/16).    PT Comments   *Pt progressing with mobility, she walked 27' with RW today with assist. **  Follow Up Recommendations  Supervision/Assistance - 24 hour;SNF     Does the patient have the potential to tolerate intense rehabilitation     Barriers to Discharge        Equipment Recommendations  Rolling walker with 5" wheels    Recommendations for Other Services    Frequency Min 3X/week   Progress towards PT Goals Progress towards PT goals: Progressing toward goals  Plan      Precautions / Restrictions Precautions Precautions: Fall Restrictions Weight Bearing Restrictions: No   Pertinent Vitals/Pain **SaO2 85% on RA, 90% on 4L O2 0/10 pain*    Mobility  Transfers Sit to Stand: 1: +2 Total assist;With upper extremity assist;From chair/3-in-1 Sit to Stand: Patient Percentage: 70% Stand to Sit: 1: +2 Total assist;With upper extremity assist;To chair/3-in-1 Stand to Sit: Patient Percentage: 70% Details for Transfer Assistance: verbal cues for hand placement &  safety. +2 for lines and safety. Pt needs assist to rise and steady and control descent.  Ambulation/Gait Ambulation/Gait Assistance: 1: +2 Total assist Ambulation/Gait: Patient Percentage: 70% Ambulation Distance (Feet): 60 Feet Assistive device: Rolling walker Ambulation/Gait Assistance Details: walks too far behind RW Gait  Pattern: Decreased stride length;Trunk flexed Gait velocity: decreased General Gait Details: frequent VCs for positioning in RW and for flexed posture, pt able to return demonstrate with VCs    Exercises     PT Diagnosis:    PT Problem List:   PT Treatment Interventions:     PT Goals (current goals can now be found in the care plan section) Acute Rehab PT Goals Patient Stated Goal: to be able to walk to the bathroom PT Goal Formulation: With patient/family Time For Goal Achievement: 02/01/13 Potential to Achieve Goals: Good  Visit Information  Last PT Received On: 01/22/13 Assistance Needed: +2 History of Present Illness: Pt is an 79 Y/O F w/ 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 She has been followed by Dr Gwenyth Bouillon and Dr Mitzi Hansen. Underwent first chemo on 10/13 (Taxol & carboplatin) and her first round of XRT on 10/17.  Admitted on 10/20 w/ hypotension, dehydration, and AMS after several days of nausea, vomiting and diarrhea (first noted on 10/16).     Subjective Data  Patient Stated Goal: to be able to walk to the bathroom   Cognition  Cognition Arousal/Alertness: Awake/alert Behavior During Therapy: WFL for tasks assessed/performed Overall Cognitive Status: Impaired/Different from baseline    Balance     End of Session PT - End of Session Equipment Utilized During Treatment: Oxygen Activity Tolerance: Patient limited by fatigue Patient left: in chair;with call bell/phone within reach;with family/visitor present;with chair alarm set Nurse Communication: Mobility status   GP     Ralene Bathe Kistler 01/22/2013, 11:16 AM [phone]

## 2013-01-22 NOTE — Progress Notes (Signed)
CSW contacted pt daughter via telephone as pt daughter was not present in pt room at this time.  Pt daughter received bed offers during weekend and CSW updated pt daughter with current bed offers.  Pt daughter plans to tour [facility] and states that decision will likely be [facility] or [facility].  CSW requested pt daughter notify CSW of decision.  CSW spoke with pt insurance who confirmed that pt approved for SNF and requested CSW notify pt insurance of pt and pt family choice.  CSW to continue to follow and facilitate pt discharge needs when pt medically ready for discharge.  Jacklynn Lewis, MSW, LCSWA  Clinical Social Work [phone]

## 2013-01-23 ENCOUNTER — Non-Acute Institutional Stay (SKILLED_NURSING_FACILITY): Payer: Medicare Other | Admitting: Family

## 2013-01-23 ENCOUNTER — Ambulatory Visit: Payer: Medicare Other

## 2013-01-23 ENCOUNTER — Encounter: Payer: Self-pay | Admitting: Family

## 2013-01-23 DIAGNOSIS — E039 Hypothyroidism, unspecified: Secondary | ICD-10-CM

## 2013-01-23 DIAGNOSIS — K219 Gastro-esophageal reflux disease without esophagitis: Secondary | ICD-10-CM

## 2013-01-23 DIAGNOSIS — J189 Pneumonia, unspecified organism: Secondary | ICD-10-CM

## 2013-01-23 DIAGNOSIS — R112 Nausea with vomiting, unspecified: Secondary | ICD-10-CM

## 2013-01-23 NOTE — Progress Notes (Signed)
Patient ID: Peggy Espinoza Number, female   DOB: May 27, 1929, 79 y.o.   MRN: [id number] Date: 01/23/13  Facility:[facility]  Code Status:  DNR  Chief Complaint  Patient presents with  . Hospitalization Follow-up    HPI:Pt presents for hospitalization follow-up. Pt was d/c 01/22/13 following hospital course of shock, post-obstructive PNA, hypokalemia, anemia, and hypomagnesemia. Pt has a pmh of non small cell lung cancer, which she will be followed by oncology as out patient, as well as hypothyroidism.  Pt reports nausea with one bout of emesis. Pt reports onset of nausea  > 7 days ago and endorses phenergan helps relieves nausea.  Pt denies diarrhea, fever, chills, abdominal pain/tenderness, or malaise. Pt denies further concerns/issues at present.       Allergies  Allergen Reactions  . Bactrim [Sulfamethoxazole-Trimethoprim] Hives  . Ciprofloxacin Hives  . Penicillins Swelling    Per MD, not throat swelling  . Welchol [Colesevelam Hcl] Other (See Comments)    Body aches  . Zocor [Simvastatin] Other (See Comments)    Muscle aches     Medication List       This list is accurate as of: 01/23/13  3:22 PM.  Always use your most recent med list.               acetaminophen 325 MG tablet  Commonly known as:  TYLENOL  Take 2 tablets (650 mg total) by mouth every 6 (six) hours as needed for fever.     amoxicillin-clavulanate 875-125 MG per tablet  Commonly known as:  AUGMENTIN  Take 1 tablet by mouth every 12 (twelve) hours. For 4 more days     donepezil 5 MG tablet  Commonly known as:  ARICEPT  Take 5 mg by mouth at bedtime.     esomeprazole 40 MG capsule  Commonly known as:  NEXIUM  Take 40 mg by mouth daily before breakfast.     ezetimibe 10 MG tablet  Commonly known as:  ZETIA  Take 10 mg by mouth daily.     ferrous sulfate 325 (65 FE) MG tablet  Take 325 mg by mouth 2 (two) times daily.     fluticasone 50 MCG/ACT nasal spray  Commonly known as:  FLONASE  Place 2  sprays into the nose daily as needed for rhinitis.     guaiFENesin 600 MG 12 hr tablet  Commonly known as:  MUCINEX  Take 2 tablets (1,200 mg total) by mouth 2 (two) times daily.     levothyroxine 88 MCG tablet  Commonly known as:  SYNTHROID, LEVOTHROID  Take 88 mcg by mouth daily before breakfast.     magnesium oxide 400 (241.3 MG) MG tablet  Commonly known as:  MAG-OX  Take 1 tablet (400 mg total) by mouth 2 (two) times daily. For 7 days     multivitamin tablet  Take 1 tablet by mouth daily.     rosuvastatin 5 MG tablet  Commonly known as:  CRESTOR  Take 5 mg by mouth at bedtime.     Vitamin D 2000 UNITS tablet  Take 2,000 Units by mouth daily.         DATA REVIEWED   Laboratory Studies: 01/21/13-Na 136, K 3.3, Cl 97, CO2, Glucose103, BUN 4, Creatinine 0.39, Calcium 9.7 Past Medical History  Diagnosis Date  . HTN (hypertension)   . Hyperlipidemia   . Thyroid disease   . Diabetes mellitus   . Anxiety   . History of stress test 11/2012  pt. told that it was wnl- [facility].   . Hypothyroidism   . Dementia     pt. states daughter got her started on Aricept   . GERD (gastroesophageal reflux disease)   . Arthritis     knees, legs, back  . Bladder incontinence     wears pad always   . Lung cancer      Past Surgical History  Procedure Laterality Date  . Arm fracture surgery    . Tonsillectomy    . Appendectomy    . Abdominal hysterectomy    . Lipoma removal    . Eye surgery    . Cholecystectomy    . Cataract extraction    . Fracture surgery      plate in R hand - long time ago  . Carpal tunnel release      R arm  . Video bronchoscopy N/A 12/15/2012    Procedure: VIDEO BRONCHOSCOPY;  Surgeon: Loreli Slot, MD;  Location: [facility] OR;  Service: Thoracic;  Laterality: N/A;     History   Social History  . Marital Status: Widowed    Spouse Name: N/A    Number of Children: 3  . Years of Education: 11   Occupational History  . retired     Social History Main Topics  . Smoking status: Former Smoker -- 1.00 packs/day for 30 years    Types: Cigarettes    Start date: 03/29/1985  . Smokeless tobacco: Never Used  . Alcohol Use: No  . Drug Use: No  . Sexual Activity: No   Other Topics Concern  . Not on file   Social History Narrative  . No narrative on file    Review of Systems  Constitutional: Negative.   Respiratory: Positive for shortness of breath.        Oxygen via [place]/SOB w/ minimal exertion/ Lung CA  Cardiovascular: Positive for leg swelling.  Gastrointestinal: Positive for nausea and vomiting.  Genitourinary:       Incontinence  Musculoskeletal:       Use walker  Skin: Negative.   Neurological: Negative.   Endo/Heme/Allergies: Negative.   Psychiatric/Behavioral: Negative.     Physical Exam Filed Vitals:   01/23/13 1516  BP: 148/73  Pulse: 107  Temp: 98.9 F (37.2 C)  Resp: 20   There is no weight on file to calculate BMI. Physical Exam  Constitutional: She is oriented to person, place, and time.  HENT:  Mouth/Throat: Oropharynx is clear and moist.  Cardiovascular: Normal rate and regular rhythm.   Pulmonary/Chest: She has decreased breath sounds in the right lower field and the left lower field.  Oxygen via [place] at 3L  Abdominal: Soft. Bowel sounds are normal.  Rounded abdomen  Genitourinary:  Incontinent brief in place  Neurological: She is alert and oriented to person, place, and time. GCS eye subscore is 4. GCS verbal subscore is 5. GCS motor subscore is 6.  Skin: Skin is warm, dry and intact.  Dsg to L lateral neck    ASSESSMENT/PLAN  1. PNA- continue antibiotic course, Augmentin, pulmonary toileting, encourage incentive spirometry 2. Nausea- will start phenergan 12.5 mg po/suppository q 6 hours for nausea/vomiting 3. Hypothyroidism- will continue current dose of Synthroid, pt is stable 4. Genella Rife- will continue current dosage of Nexium; pt is currently stable 5. Dementia- pt is stable  on current dose of Aricept 6. Hypomagnesia- will finish course of Mag-ox; d/c 11/3  Follow up:prn

## 2013-01-24 ENCOUNTER — Ambulatory Visit: Payer: Medicare Other

## 2013-01-25 ENCOUNTER — Ambulatory Visit: Payer: Medicare Other

## 2013-01-26 ENCOUNTER — Non-Acute Institutional Stay (SKILLED_NURSING_FACILITY): Payer: Medicare Other | Admitting: Internal Medicine

## 2013-01-26 ENCOUNTER — Ambulatory Visit: Payer: Medicare Other

## 2013-01-26 ENCOUNTER — Encounter: Payer: Medicare Other | Admitting: Radiation Oncology

## 2013-01-26 DIAGNOSIS — J189 Pneumonia, unspecified organism: Secondary | ICD-10-CM

## 2013-01-26 DIAGNOSIS — R112 Nausea with vomiting, unspecified: Secondary | ICD-10-CM

## 2013-01-26 DIAGNOSIS — C3410 Malignant neoplasm of upper lobe, unspecified bronchus or lung: Secondary | ICD-10-CM

## 2013-01-26 NOTE — Progress Notes (Signed)
Patient ID: Peggy Espinoza, female   DOB: 01/28/1930, 79 y.o.   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 She had been initiated with chemotherapy on October 13 [Taxol and carboplatin] her first round of radiation on October 17 of. She was admitted on October 20 with the dehydration hypotension nausea vomiting and diarrhea. She was hypotensive initially requiring critical care medicine. She was felt to have caused vomiting diarrhea related to chemotherapy. Her stool cultures were negative for. She had a magnesium level of 1.5 and supplements were ordered. She was felt to have community-acquired pneumonia along with hypovolemia from GI losses.  Past Medical History  Diagnosis Date  . HTN (hypertension)   . Hyperlipidemia   . Thyroid disease   . Diabetes mellitus   . Anxiety   . [facility]2012    [facility]   . Hypothyroidism   . Dementia     pt. states daughter got her started on Aricept   . GERD (gastroesophageal reflux disease)   . Arthritis     knees, legs, back  . Bladder incontinence     wears pad always   . Lung cancer    Past Surgical History  Procedure Laterality Date  . Arm fracture surgery    . Tonsillectomy    . Appendectomy    . Abdominal hysterectomy    . Lipoma removal    . Eye surgery    . Cholecystectomy    . Cataract extraction    . Fracture surgery      plate in R hand - long time ago  . Carpal tunnel release      R arm  . Video bronchoscopy N/A 12/15/2012    Procedure: VIDEO BRONCHOSCOPY;  Surgeon: Loreli Slot, MD;  Location: [facility] OR;  Service: Thoracic;  Laterality: N/A;   Medications; Augmentin one tablet every 12 for 4 more days, Aricept 5 mg daily, Nexium 40 mg daily, Z.etia 10 mg daily,  ferrous sulfate 325 twice a day, Flonase 2 sprays daily, Mucinex 2 tablets by mouth 2 times daily, Synthroid 88 mcg daily, magnesium oxide 400 twice a day for 7 days, Crestor 5 mg daily, vitamin D 3 2000 units daily,  Socially; patient lives in [place] in her own home. She is independent with ADLs and IADLs not on oxygen. She states her cancer was discovered when she presented to her physician with a nonclearing cough and a chest x-ray was done. Hasn't smoked in many years  reports that she has quit smoking. Her smoking use included Cigarettes. She started smoking about 27 years ago. She has a 30 pack-year smoking history. She has never used smokeless tobacco. She reports that she does not drink alcohol or use illicit drugs.  family history includes Arthritis in an other family member; Cancer in an other family member; Diabetes in an other family member; Heart disease in an other family member.  Review of systems Respiratory; no exertional shortness of breath no cough. She is however on oxygen 3 L Cardiac; no exertional shortness of breath GI; no nausea no vomiting no diarrhea. Patient states she is eating well without swallowing difficulties Extremities she is already able to ambulate. Very concerned about edema in her feet  Physical examination Gen. patient looks surprisingly well, alert bright and conversational Vitals O2 sat 98% on 3 L down to 92% on 1 L respirations 20 pulse rate 92 Respiratory; no clubbing. Breathing is free and easy without accessory muscle use. There were coarse crackles or what may be a friction rub in the right lower lobe. Cardiac heart sounds are normal she appears to be euvolemic no gallops Abdomen no liver or spleen no tenderness no masses Chest; patient complains of pain in her chest from being lifted in the hospital however there is no overt tenderness in her wrist no bruising noted GU no suprapubic or costovertebral angle tenderness Extremities  mild edema bilaterally up to the midcalf. There is no evidence of a DVT on either side peripheral pulses are palpable Neurologic; she is able to bring results to a standing position walk without assistance. Balance is somewhat poor  Impression/plan #1 community-acquired pneumonia successfully treated. I am assuming this is in the right lower lobe. May of been a component of pleurisy which is resolving #2 nausea vomiting, fluid and electrolyte issues appear to be resolving and related to chemotherapy. She is not having diarrhea #3 type 2 diabetes now off her metformin as well as her Benicar hydrochlorothiazide. We'll observe her while she is here #4 recently trivial lower extremity edema bilaterally at this point I don't feel pressed to give her a diuretic #5 non-small cell carcinoma of the right lung. Recently started chemotherapy and radiation. I've encouraged her not to eat well on this too quickly.  #6 marked anxiety requesting something for her "nerves] seems reasonable #7 hypoxemia. She was not on oxygen prior to his admission and I'm doubtful she really requires this now I will attempt to taper this off I have already put her down to 1 L and she is at 92%EXAM: CT ABDOMEN AND PELVIS WITHOUT CONTRAST   TECHNIQUE: Multidetector CT imaging of the abdomen and pelvis was performed following the standard protocol without intravenous contrast.   COMPARISON:  Recent PET-CT on 11/16/2012 and chest CT on 11/01/2012   FINDINGS: Images through the lung bases show increased size of mass in the central right middle lobe and new small bilateral pleural effusions, right side greater than left. Large hiatal hernia is again demonstrated.   A tiny nonobstructing 1-2 mm calculus is noted in the midpole of the left kidney. There is no evidence of hydronephrosis. No evidence of ureteral calculi or dilatation. No bladder calculi identified. Mild symmetric perinephric stranding noted bilaterally.  Noncontrast images of the liver, pancreas, spleen, and adrenal glands are normal in appearance.   A simple appearing cyst is seen in the right adnexa measuring 3 x 4 cm. This is stable compared to previous study, which showed absence of hypermetabolic activity. Prior hysterectomy noted. Severe sigmoid diverticulosis is again demonstrated, however there is no evidence of diverticulitis. No other acute inflammatory process or abscess identified within the abdomen or pelvis. No evidence of dilated bowel loops. No suspicious bone lesions identified.   IMPRESSION: No acute findings within the abdomen or pelvis.   Stable 4 cm benign appearing right adnexal cyst, sigmoid diverticulosis, and hiatal hernia.   Tiny nonobstructing left renal calculus. No evidence of hydronephrosis.   Increased size of right middle lobe lung mass and right greater than left pleural effusions.   Study Result    *RADIOLOGY REPORT*   Clinical Data: Possible mass noted on recent chest x-ray.   CT CHEST WITH CONTRAST   Technique:  Multidetector CT  imaging of the chest was performed following the standard protocol during bolus administration of intravenous contrast.   Contrast: 80mL OMNIPAQUE IOHEXOL 300 MG/ML  SOLN   Comparison: Chest x-ray 11/01/2012.   Findings:   Mediastinum: Heart size is normal. There is no significant pericardial fluid, thickening or pericardial calcification. There is atherosclerosis of the thoracic aorta, the great vessels of the mediastinum and the coronary arteries, including calcified atherosclerotic plaque in the left anterior descending and right coronary arteries. No pathologically enlarged mediastinal or hilar lymph nodes. Moderate sized hiatal hernia with some circumferential thickening of the distal third of the esophagus.   Lungs/Pleura: In the anterior aspect of the right lung there is a large mass which measures approximately 4.6 x 4.3 x 5.8 cm.  This mass  involves the central portions of both the right middle and upper lobes, and clearly violates the minor fissure (best demonstrated on sagittal reconstructions).  Extending anteriorly from this mass there is some nodular thickening of the minor fissure.  The portion of the mass within the right middle lobe splays the bronchi extending to the medial and lateral segments of the right middle lobe. It demonstrates heterogeneous peripheral enhancement with central areas of lower attenuation, which may represent some internal necrosis.  No additional suspicious appearing pulmonary nodules or masses are otherwise identified. Small pleural-based calcification in the medial aspect of the left lower lobe likely represents calcified granuloma.  A small area of scarring is noted in the medial aspect of the left lower lobe.  No acute consolidative airspace disease.  No pleural effusions.   Upper Abdomen: Status post cholecystectomy.  Multiple small calcifications within the liver compatible with calcified granulomas.  A 1.6 cm well-defined low attenuation lesion in the upper pole of the left kidney compatible with a simple cyst. Atherosclerosis.   Musculoskeletal: There are no aggressive appearing lytic or blastic lesions noted in the visualized portions of the skeleton.   IMPRESSION: 1. 4.6 x 4.3 x 5.8 cm mass in the right lung involving the central portions of both the right middle and upper lobes.  No associated hilar or mediastinal adenopathy at this time.  Based on the aggressive appearance extending across the minor fissure, this is compatible with a T4 lesion.  Assuming no distal metastases (which needs to be confirmed), this appears to represent T4, N0, M0 disease (i.e., this may represent Stage III-A disease). Given the proximity of this lesion into the right middle lobe bronchi (discussed above), this should be amendable to bronchoscopic guided biopsy.  Surgical consultation is  recommended. 2. Atherosclerosis, including left main and three-vessel coronary artery disease. 3.  Moderate sized hiatal hernia with some mild circumferential thickening of the distal third of the esophagus.  These findings may simply reflect a reflux esophagitis, however if there is any clinical concern for Barrett's metaplasia or esophageal neoplasia, further evaluation with endoscopy should be considered. 4.  Sequelae of old granulomatous disease, as above. 5.  Status post cholecystectomy.   These results were called by telephone on 11/01/2012 at 11:20 a.m. to Dr. Phillips Odor, who verbally acknowledged these results

## 2013-01-29 ENCOUNTER — Ambulatory Visit: Payer: Medicare Other

## 2013-01-29 ENCOUNTER — Other Ambulatory Visit: Payer: Medicare Other

## 2013-01-30 ENCOUNTER — Ambulatory Visit: Payer: Medicare Other

## 2013-01-31 ENCOUNTER — Ambulatory Visit: Payer: Medicare Other

## 2013-02-01 ENCOUNTER — Ambulatory Visit: Payer: Medicare Other

## 2013-02-02 ENCOUNTER — Ambulatory Visit: Payer: Medicare Other

## 2013-02-05 ENCOUNTER — Non-Acute Institutional Stay (SKILLED_NURSING_FACILITY): Payer: Medicare Other | Admitting: Internal Medicine

## 2013-02-05 ENCOUNTER — Ambulatory Visit: Payer: Medicare Other

## 2013-02-05 ENCOUNTER — Other Ambulatory Visit: Payer: Medicare Other

## 2013-02-05 DIAGNOSIS — H8110 Benign paroxysmal vertigo, unspecified ear: Secondary | ICD-10-CM

## 2013-02-05 DIAGNOSIS — R739 Hyperglycemia, unspecified: Secondary | ICD-10-CM

## 2013-02-05 DIAGNOSIS — D630 Anemia in neoplastic disease: Secondary | ICD-10-CM

## 2013-02-05 DIAGNOSIS — R7309 Other abnormal glucose: Secondary | ICD-10-CM

## 2013-02-06 ENCOUNTER — Ambulatory Visit: Payer: Medicare Other

## 2013-02-07 ENCOUNTER — Ambulatory Visit: Payer: Medicare Other

## 2013-02-08 ENCOUNTER — Ambulatory Visit: Payer: Medicare Other

## 2013-02-09 ENCOUNTER — Ambulatory Visit: Payer: Medicare Other

## 2013-02-09 DIAGNOSIS — H8110 Benign paroxysmal vertigo, unspecified ear: Secondary | ICD-10-CM

## 2013-02-09 DIAGNOSIS — D630 Anemia in neoplastic disease: Secondary | ICD-10-CM | POA: Insufficient documentation

## 2013-02-09 DIAGNOSIS — R739 Hyperglycemia, unspecified: Secondary | ICD-10-CM | POA: Insufficient documentation

## 2013-02-09 HISTORY — DX: Benign paroxysmal vertigo, unspecified ear: H81.10

## 2013-02-09 NOTE — Progress Notes (Signed)
PROGRESS NOTE  DATE: 02/05/2013  FACILITY:  [facility]  LEVEL OF CARE: SNF (31)  Acute Visit  CHIEF COMPLAINT:  Manage vertigo, anemia of neoplastic dz and hyperglycemia  HISTORY OF PRESENT ILLNESS: I was requested by the staff to assess the patient regarding above problem(s):  VERTIGO: Patient is complaining of intermittent vertigo.  She has had vertigo chronically. She also reports an associated nausea. She has tried meclizine in the past with some relief.  HYPERGLCEMIA: New problem. On 02/09/13 glucose 148. Patient does not have a history of diabetes mellitus and she is not on prednisone. She denies polyuria & polydipsia.  ANEMIA: The anemia has been stable. The patient denies fatigue, melena or hematochezia. No complications from the medications currently being used. On 11- 4-14 hemoglobin 9.9, MCV 89. On 01-21-13 hemoglobin 8.4.  PAST MEDICAL HISTORY : Reviewed.  No changes.  CURRENT MEDICATIONS: Reviewed per [facility]  REVIEW OF SYSTEMS:  GENERAL: no change in appetite, no fatigue, no weight changes, no fever, chills or weakness RESPIRATORY: no cough, SOB, DOE,, wheezing, hemoptysis CARDIAC: no chest pain, edema or palpitations GI: no abdominal pain, diarrhea, constipation, heart burn, nausea or vomiting BREAST: c/o of pain in right lateral breast  PHYSICAL EXAMINATION  GENERAL: no acute distress, normal body habitus EYES: conjunctivae normal, sclerae normal, normal eye lids NECK: supple, trachea midline, no neck masses, no thyroid tenderness, no thyromegaly LYMPHATICS: no LAN in the neck, no supraclavicular LAN RESPIRATORY: breathing is even & unlabored, BS CTAB CARDIAC: RRR, no murmur,no extra heart sounds, no edema GI: abdomen soft, normal BS, no masses, no tenderness, no hepatomegaly, no splenomegaly PSYCHIATRIC: the patient is alert & oriented to person, affect & behavior appropriate RIGHT BREAST: There is exquisite tenderness to  palpation on the right lateral breast but no bruising, edema or warmth noted  LABS/RADIOLOGY: See history of present illness  ASSESSMENT/PLAN:  Hyperglycemia-new problem. Check hemoglobin A1c and fasting glucose level. Vertigo-start meclizine 25 mg every 6 hrs when necessary Anemia off neoplasm-hemoglobin improved Right breast pain-no obvious pathology. assured patient.  CPT CODE: 13086

## 2013-02-12 ENCOUNTER — Ambulatory Visit: Payer: Medicare Other

## 2013-02-13 ENCOUNTER — Telehealth: Payer: Self-pay | Admitting: *Deleted

## 2013-02-13 ENCOUNTER — Ambulatory Visit: Payer: Medicare Other

## 2013-02-13 NOTE — Telephone Encounter (Signed)
Called.

## 2013-02-13 NOTE — Telephone Encounter (Signed)
Called Clois Dupes [phone], daughter of patient who is at [facility] , SNF , getting better, but patient and daughter and family wants to speak with MD Mitzi Hansen and MD [facility] before deciding whether to have rad txs or not, asked if she wanted me to transfer her to our scheduler and can make a follow up appt with Dr.Moody, but she needs to call Med/Onc to schedule an appt time  With Dr. Arbutus Ped , and may be a different day, she gave verbal  Understanding,,transferred her to Otis Peak, our scheduler

## 2013-02-13 NOTE — Telephone Encounter (Signed)
Called Peggy Espinoza, daughter of patient, who is at [facility], "Mom's getting better but wants to meeet with Dr.moody,Accutane is discussed fully with the patient. It is a very effective drug to treat acne vulgaris but has many significant side effects. Chief among these are teratogenesis, hepatic injury, dyslipidemia and severe drying of the mucous membranes. All of these issues have been discussed in details. Monthly blood tests to monitor lipids and liver functions will be necessary. Expect painful dryness and/or fissuring around the lips, eyes, and other moist areas of the body. Balms may be protective. Contact lens may be too painful to wear temporarily while on this drug. Episodes of significant depression have been reported, including suicidal ideation and attempts in rare cases. It may also cause pseudotumor cerebri and hyperostosis. The patient will report any such changes in mood, depressive symptoms or suicidal thoughts, headaches, joint or bone pains.  Female patients MUST use two simultaneous methods of family planning. Accutane is Category X for pregnancy, meaning it will cause fetal teratogenic malformations, and pregnancy MUST be avoided while on this drug.  The dose is 0.5-1 mg per kg in two divided doses for 15-20 weeks.

## 2013-02-14 ENCOUNTER — Telehealth: Payer: Self-pay | Admitting: Internal Medicine

## 2013-02-14 ENCOUNTER — Ambulatory Visit: Payer: Medicare Other

## 2013-02-14 NOTE — Telephone Encounter (Signed)
returned pt call and advised on 12.4.14 appt .[name]..ok and awre

## 2013-02-15 ENCOUNTER — Ambulatory Visit: Payer: Medicare Other

## 2013-02-16 ENCOUNTER — Ambulatory Visit
Admission: RE | Admit: 2013-02-16 | Discharge: 2013-02-16 | Disposition: A | Discharge status: Home / Self Care | Payer: Medicare Other | Source: Ambulatory Visit | Attending: Radiation Oncology | Admitting: Radiation Oncology

## 2013-02-16 ENCOUNTER — Ambulatory Visit: Payer: Medicare Other

## 2013-02-16 NOTE — Progress Notes (Signed)
The patient came in with her family today to discuss the possibility of resuming treatment. We discussed the prognosis under different scenarios. All of her questions were answered. The patient's radiation and chemotherapy have been on hold for approximately one month.  At the end of this discussion the patient indicated that she wished to resume radiation treatment. She is scheduled to see medical oncology in early December but she indicated today that she does not wish to proceed with any chemotherapy. They will further think about this issue and discuss this at her appointment.  The patient will proceed with a simulation today to check for any change in the patient's tumor because of the delay.

## 2013-02-16 NOTE — Progress Notes (Signed)
Pt to see Dr Mitzi Hansen prior to treatment today. Pt's last radiation treatment was 01/12/13. She was hospitalized for pneumonia, then transfered to [facility] rehab facility. She was discharged home yesterday. Pt denies pain, cough, SOB.

## 2013-02-19 ENCOUNTER — Ambulatory Visit: Payer: Medicare Other

## 2013-02-20 ENCOUNTER — Ambulatory Visit: Payer: Medicare Other

## 2013-02-21 ENCOUNTER — Ambulatory Visit: Payer: Medicare Other

## 2013-02-26 ENCOUNTER — Ambulatory Visit: Payer: Medicare Other

## 2013-02-27 ENCOUNTER — Ambulatory Visit: Payer: Medicare Other

## 2013-02-27 ENCOUNTER — Ambulatory Visit: Payer: Medicare Other | Admitting: Radiation Oncology

## 2013-02-28 ENCOUNTER — Ambulatory Visit
Admission: RE | Admit: 2013-02-28 | Discharge: 2013-02-28 | Disposition: A | Discharge status: Home / Self Care | Payer: Medicare Other | Source: Ambulatory Visit | Attending: Radiation Oncology | Admitting: Radiation Oncology

## 2013-02-28 ENCOUNTER — Ambulatory Visit: Payer: Medicare Other

## 2013-02-28 ENCOUNTER — Telehealth: Payer: Self-pay | Admitting: Internal Medicine

## 2013-02-28 NOTE — Telephone Encounter (Signed)
pt called to cx appt and will call back to r/s °

## 2013-03-01 ENCOUNTER — Ambulatory Visit: Payer: Medicare Other

## 2013-03-01 ENCOUNTER — Ambulatory Visit
Admission: RE | Admit: 2013-03-01 | Discharge: 2013-03-01 | Disposition: A | Discharge status: Home / Self Care | Payer: Medicare Other | Source: Ambulatory Visit | Attending: Radiation Oncology | Admitting: Radiation Oncology

## 2013-03-01 ENCOUNTER — Other Ambulatory Visit: Payer: Medicare Other | Admitting: Lab

## 2013-03-01 ENCOUNTER — Ambulatory Visit: Payer: Medicare Other | Admitting: Physician Assistant

## 2013-03-01 NOTE — Progress Notes (Signed)
   Department of Radiation Oncology  Phone:  [phone] Fax:        [phone]  Weekly Treatment Note    Name: Peggy Espinoza Date: 03/01/2013 MRN: [id number] DOB: [birth year]   Current dose: 14 Gy  Current fraction: 7   MEDICATIONS: Current Outpatient Prescriptions  Medication Sig Dispense Refill  . acetaminophen (TYLENOL) 325 MG tablet Take 2 tablets (650 mg total) by mouth every 6 (six) hours as needed for fever.      . Cholecalciferol (VITAMIN D) 2000 UNITS tablet Take 2,000 Units by mouth daily.      [name] donepezil (ARICEPT) 5 MG tablet Take 5 mg by mouth at bedtime.       [name] esomeprazole (NEXIUM) 40 MG capsule Take 40 mg by mouth daily before breakfast.        . ezetimibe (ZETIA) 10 MG tablet Take 10 mg by mouth daily.        . ferrous sulfate 325 (65 FE) MG tablet Take 325 mg by mouth 2 (two) times daily.      . fluticasone (FLONASE) 50 MCG/ACT nasal spray Place 2 sprays into the nose daily as needed for rhinitis.       [name] guaiFENesin (MUCINEX) 600 MG 12 hr tablet Take 2 tablets (1,200 mg total) by mouth 2 (two) times daily.      [name] levothyroxine (SYNTHROID, LEVOTHROID) 88 MCG tablet Take 88 mcg by mouth daily before breakfast.       . magnesium oxide (MAG-OX) 400 (241.3 MG) MG tablet Take 1 tablet (400 mg total) by mouth 2 (two) times daily. For 7 days      . Multiple Vitamin (MULTIVITAMIN) tablet Take 1 tablet by mouth daily.      . rosuvastatin (CRESTOR) 5 MG tablet Take 5 mg by mouth at bedtime.       . vitamin C (ASCORBIC ACID) 500 MG tablet Take 500 mg by mouth daily.       No current facility-administered medications for this encounter.     ALLERGIES: Bactrim; Ciprofloxacin; Penicillins; Welchol; and Zocor   LABORATORY DATA:  Lab Results  Component Value Date   WBC 6.0 01/21/2013   HGB 8.4* 01/21/2013   HCT 26.5* 01/21/2013   MCV 89.8 01/21/2013   PLT 368 01/21/2013   Lab Results  Component Value Date   NA 136 01/21/2013   K 3.3* 01/21/2013   CL 97  01/21/2013   CO2 35* 01/21/2013   Lab Results  Component Value Date   ALT 15 01/17/2013   AST 24 01/17/2013   ALKPHOS 90 01/17/2013   BILITOT 0.4 01/17/2013     NARRATIVE: Peggy Espinoza was seen today for weekly treatment management. The chart was checked and the patient's films were reviewed. The patient has restarted her radiation treatment once again. She has done well with the last 2 treatments. His she denies any shortness of breath.  PHYSICAL EXAMINATION: weight is 151 lb 14.4 oz (68.901 kg). Her temperature is 98.2 F (36.8 C). Her blood pressure is 133/85 and her pulse is 101. Her oxygen saturation is 95%.        ASSESSMENT: The patient is doing satisfactorily with treatment.  PLAN: We will continue with the patient's radiation treatment as planned.

## 2013-03-01 NOTE — Progress Notes (Signed)
Patient for weekly assessment of radiation to right lung.Completed 7 of 30 treatments.Only has arthritic pain of knees.Denies shortness of breath.Non-productive cough.Takes mucinex twice daily.No concerns voiced.

## 2013-03-02 ENCOUNTER — Ambulatory Visit: Payer: Medicare Other

## 2013-03-02 ENCOUNTER — Ambulatory Visit
Admission: RE | Admit: 2013-03-02 | Discharge: 2013-03-02 | Disposition: A | Discharge status: Home / Self Care | Payer: Medicare Other | Source: Ambulatory Visit | Attending: Radiation Oncology | Admitting: Radiation Oncology

## 2013-03-03 ENCOUNTER — Ambulatory Visit: Payer: Medicare Other

## 2013-03-05 ENCOUNTER — Ambulatory Visit
Admission: RE | Admit: 2013-03-05 | Discharge: 2013-03-05 | Disposition: A | Discharge status: Home / Self Care | Payer: Medicare Other | Source: Ambulatory Visit | Attending: Radiation Oncology | Admitting: Radiation Oncology

## 2013-03-05 ENCOUNTER — Ambulatory Visit: Payer: Medicare Other

## 2013-03-06 ENCOUNTER — Ambulatory Visit: Payer: Medicare Other

## 2013-03-06 ENCOUNTER — Ambulatory Visit
Admission: RE | Admit: 2013-03-06 | Discharge: 2013-03-06 | Disposition: A | Discharge status: Home / Self Care | Payer: Medicare Other | Source: Ambulatory Visit | Attending: Radiation Oncology | Admitting: Radiation Oncology

## 2013-03-06 ENCOUNTER — Telehealth: Payer: Self-pay | Admitting: Medical Oncology

## 2013-03-06 NOTE — Telephone Encounter (Signed)
Received fax for pt orders to be signed. I called [facility] and asked them to fax orders to pts PCP-Dr Baltazar Najjar,  as Dr Arbutus Ped has not seen pt since October.

## 2013-03-07 ENCOUNTER — Ambulatory Visit
Admission: RE | Admit: 2013-03-07 | Discharge: 2013-03-07 | Disposition: A | Discharge status: Home / Self Care | Payer: Medicare Other | Source: Ambulatory Visit | Attending: Radiation Oncology | Admitting: Radiation Oncology

## 2013-03-07 ENCOUNTER — Ambulatory Visit: Payer: Medicare Other

## 2013-03-08 ENCOUNTER — Ambulatory Visit: Payer: Medicare Other

## 2013-03-08 ENCOUNTER — Ambulatory Visit (INDEPENDENT_AMBULATORY_CARE_PROVIDER_SITE_OTHER): Payer: Medicare Other | Admitting: Orthopedic Surgery

## 2013-03-08 ENCOUNTER — Encounter: Payer: Self-pay | Admitting: Orthopedic Surgery

## 2013-03-08 ENCOUNTER — Ambulatory Visit
Admission: RE | Admit: 2013-03-08 | Discharge: 2013-03-08 | Disposition: A | Discharge status: Home / Self Care | Payer: Medicare Other | Source: Ambulatory Visit | Attending: Radiation Oncology | Admitting: Radiation Oncology

## 2013-03-08 VITALS — BP 118/70 | Ht 63.5 in | Wt 157.0 lb

## 2013-03-08 DIAGNOSIS — M25561 Pain in right knee: Secondary | ICD-10-CM

## 2013-03-08 DIAGNOSIS — M25569 Pain in unspecified knee: Secondary | ICD-10-CM

## 2013-03-08 DIAGNOSIS — M170 Bilateral primary osteoarthritis of knee: Secondary | ICD-10-CM

## 2013-03-08 NOTE — Patient Instructions (Signed)
You have received a steroid shot. 15% of patients experience increased pain at the injection site with in the next 24 hours. This is best treated with ice and tylenol extra strength 2 tabs every 8 hours. If you are still having pain please call the office.    

## 2013-03-08 NOTE — Progress Notes (Signed)
Patient ID: Peggy Espinoza Number, female   DOB: 10/11/1929, 79 y.o.   MRN: [id number]   Bilateral knee pain swelling  Peggy Espinoza would like injections in both knees. Over the last few weeks she's noticed increased knee pain with aching diffusely about the knee. Review of systems she has lung cancer undergoing radiation she's been doing that for month  She has bilateral knee effusions with bilateral knee tenderness medial and lateral joint lines  Recommend bilateral knee injections followup as needed  Knee  Injection Procedure Note  Pre-operative Diagnosis: left knee oa  Post-operative Diagnosis: same  Indications: pain  Anesthesia: ethyl chloride   Procedure Details   Verbal consent was obtained for the procedure. Time out was completed.The joint was prepped with alcohol, followed by  Ethyl chloride spray and A 20 gauge needle was inserted into the knee via lateral approach; 4ml 1% lidocaine and 1 ml of depomedrol  was then injected into the joint . The needle was removed and the area cleansed and dressed.  Complications:  None; patient tolerated the procedure well. Knee  Injection Procedure Note  Pre-operative Diagnosis: right knee oa  Post-operative Diagnosis: same  Indications: pain  Anesthesia: ethyl chloride   Procedure Details   Verbal consent was obtained for the procedure. Time out was completed.The joint was prepped with alcohol, followed by  Ethyl chloride spray and A 20 gauge needle was inserted into the knee via lateral approach; 4ml 1% lidocaine and 1 ml of depomedrol  was then injected into the joint . The needle was removed and the area cleansed and dressed.  Complications:  None; patient tolerated the procedure well.

## 2013-03-09 ENCOUNTER — Ambulatory Visit
Admission: RE | Admit: 2013-03-09 | Discharge: 2013-03-09 | Disposition: A | Discharge status: Home / Self Care | Payer: Medicare Other | Source: Ambulatory Visit | Attending: Radiation Oncology | Admitting: Radiation Oncology

## 2013-03-09 ENCOUNTER — Ambulatory Visit: Payer: Medicare Other

## 2013-03-09 ENCOUNTER — Encounter: Payer: Self-pay | Admitting: Radiation Oncology

## 2013-03-09 NOTE — Progress Notes (Signed)
Weekly rad tx lung, right, 13 completed, 97% room air, sob with exertion, coughing clear pheglm, ghad 2 cortisone injections b/l knees yesterday feeel a lot better stated patient 11:18 AM

## 2013-03-09 NOTE — Progress Notes (Signed)
   Department of Radiation Oncology  Phone:  [phone] Fax:        [phone]  Weekly Treatment Note    Name: Peggy Espinoza Date: 03/09/2013 MRN: [id number] DOB: 1930-03-09   Current dose: 26 Gy  Current fraction: 13   MEDICATIONS: Current Outpatient Prescriptions  Medication Sig Dispense Refill  . acetaminophen (TYLENOL) 325 MG tablet Take 2 tablets (650 mg total) by mouth every 6 (six) hours as needed for fever.      . Cholecalciferol (VITAMIN D) 2000 UNITS tablet Take 2,000 Units by mouth daily.      [name] donepezil (ARICEPT) 5 MG tablet Take 5 mg by mouth at bedtime.       [name] esomeprazole (NEXIUM) 40 MG capsule Take 40 mg by mouth daily before breakfast.        . ezetimibe (ZETIA) 10 MG tablet Take 10 mg by mouth daily.        . ferrous sulfate 325 (65 FE) MG tablet Take 325 mg by mouth 2 (two) times daily.      . fluticasone (FLONASE) 50 MCG/ACT nasal spray Place 2 sprays into the nose daily as needed for rhinitis.       [name] guaiFENesin (MUCINEX) 600 MG 12 hr tablet Take 2 tablets (1,200 mg total) by mouth 2 (two) times daily.      [name] levothyroxine (SYNTHROID, LEVOTHROID) 88 MCG tablet Take 88 mcg by mouth daily before breakfast.       . magnesium oxide (MAG-OX) 400 (241.3 MG) MG tablet Take 1 tablet (400 mg total) by mouth 2 (two) times daily. For 7 days      . Multiple Vitamin (MULTIVITAMIN) tablet Take 1 tablet by mouth daily.      . rosuvastatin (CRESTOR) 5 MG tablet Take 5 mg by mouth at bedtime.       . vitamin C (ASCORBIC ACID) 500 MG tablet Take 500 mg by mouth daily.       No current facility-administered medications for this encounter.     ALLERGIES: Bactrim; Ciprofloxacin; Penicillins; Welchol; and Zocor   LABORATORY DATA:  Lab Results  Component Value Date   WBC 6.0 01/21/2013   HGB 8.4* 01/21/2013   HCT 26.5* 01/21/2013   MCV 89.8 01/21/2013   PLT 368 01/21/2013   Lab Results  Component Value Date   NA 136 01/21/2013   K 3.3* 01/21/2013   CL 97  01/21/2013   CO2 35* 01/21/2013   Lab Results  Component Value Date   ALT 15 01/17/2013   AST 24 01/17/2013   ALKPHOS 90 01/17/2013   BILITOT 0.4 01/17/2013     NARRATIVE: Peggy Espinoza was seen today for weekly treatment management. The chart was checked and the patient's films were reviewed. The patient states that she is doing well, no problems that she restart her treatment. She is pleased that her knees feel better after steroid injections yesterday.  PHYSICAL EXAMINATION: weight is 150 lb (68.04 kg). Her oral temperature is 97.3 F (36.3 C). Her blood pressure is 112/80 and her pulse is 100. Her respiration is 20 and oxygen saturation is 97%.        ASSESSMENT: The patient is doing satisfactorily with treatment.  PLAN: We will continue with the patient's radiation treatment as planned.

## 2013-03-11 ENCOUNTER — Ambulatory Visit: Payer: Medicare Other

## 2013-03-12 ENCOUNTER — Ambulatory Visit
Admission: RE | Admit: 2013-03-12 | Discharge: 2013-03-12 | Disposition: A | Discharge status: Home / Self Care | Payer: Medicare Other | Source: Ambulatory Visit | Attending: Radiation Oncology | Admitting: Radiation Oncology

## 2013-03-12 ENCOUNTER — Encounter: Payer: Self-pay | Admitting: *Deleted

## 2013-03-12 ENCOUNTER — Ambulatory Visit: Payer: Medicare Other

## 2013-03-12 NOTE — Progress Notes (Signed)
Chaplain made initial visit with pt and her daughter in the lobby. Daughter noticed chaplain's ring and began discussing her own experience with marriage. Chaplain practiced relationship building with family. Pt asked if chaplain would pray for her. Chaplain will keep pt in personal prayers.

## 2013-03-13 ENCOUNTER — Ambulatory Visit: Payer: Medicare Other

## 2013-03-13 ENCOUNTER — Ambulatory Visit
Admission: RE | Admit: 2013-03-13 | Discharge: 2013-03-13 | Disposition: A | Discharge status: Home / Self Care | Payer: Medicare Other | Source: Ambulatory Visit | Attending: Radiation Oncology | Admitting: Radiation Oncology

## 2013-03-13 ENCOUNTER — Encounter: Payer: Self-pay | Admitting: *Deleted

## 2013-03-13 NOTE — Progress Notes (Signed)
Chaplain made follow-up visit with pt in lobby. Pt greeted chaplain and asked if chp had been praying for her. Chaplain said yes. Pt said that she appreciated the visit and the smile. Chaplain will follow up as necessary.

## 2013-03-14 ENCOUNTER — Ambulatory Visit
Admission: RE | Admit: 2013-03-14 | Discharge: 2013-03-14 | Disposition: A | Discharge status: Home / Self Care | Payer: Medicare Other | Source: Ambulatory Visit | Attending: Radiation Oncology | Admitting: Radiation Oncology

## 2013-03-14 ENCOUNTER — Ambulatory Visit: Payer: Medicare Other

## 2013-03-14 ENCOUNTER — Telehealth: Payer: Self-pay | Admitting: Dietician

## 2013-03-14 NOTE — Telephone Encounter (Signed)
Brief Outpatient Oncology Nutrition Note  Patient has been identified to be at risk on malnutrition screen.  Wt Readings from Last 10 Encounters:  03/09/13 150 lb (68.04 kg)  03/08/13 157 lb (71.215 kg)  03/01/13 151 lb 14.4 oz (68.901 kg)  02/16/13 151 lb 6.4 oz (68.675 kg)  01/20/13 171 lb 1.2 oz (77.6 kg)  01/12/13 149 lb 4.8 oz (67.722 kg)  01/09/13 157 lb (71.215 kg)  12/28/12 153 lb 6.4 oz (69.582 kg)  12/20/12 152 lb (68.947 kg)  12/14/12 152 lb (68.947 kg)    Patient with non-small cell carcinoma of lung receiving XRT.  Called and spoke with patient's daughter who stated that patient is eating well and overall weight has been stable.  No questions or concerns at this time.    [facility] RD available as needed.  Oran Rein, RD, LDN

## 2013-03-15 ENCOUNTER — Ambulatory Visit: Payer: Medicare Other

## 2013-03-15 ENCOUNTER — Ambulatory Visit
Admission: RE | Admit: 2013-03-15 | Discharge: 2013-03-15 | Disposition: A | Discharge status: Home / Self Care | Payer: Medicare Other | Source: Ambulatory Visit | Attending: Radiation Oncology | Admitting: Radiation Oncology

## 2013-03-16 ENCOUNTER — Ambulatory Visit
Admission: RE | Admit: 2013-03-16 | Discharge: 2013-03-16 | Disposition: A | Discharge status: Home / Self Care | Payer: Medicare Other | Source: Ambulatory Visit | Attending: Radiation Oncology | Admitting: Radiation Oncology

## 2013-03-16 ENCOUNTER — Ambulatory Visit: Payer: Medicare Other

## 2013-03-16 ENCOUNTER — Encounter: Payer: Self-pay | Admitting: Radiation Oncology

## 2013-03-16 NOTE — Progress Notes (Signed)
   Department of Radiation Oncology  Phone:  [phone] Fax:        [phone]  Weekly Treatment Note    Name: Peggy Espinoza Date: 03/16/2013 MRN: [id number] DOB: 1929-10-08   Current dose: 36 Gy  Current fraction: 18   MEDICATIONS: Current Outpatient Prescriptions  Medication Sig Dispense Refill  . acetaminophen (TYLENOL) 325 MG tablet Take 2 tablets (650 mg total) by mouth every 6 (six) hours as needed for fever.      . Cholecalciferol (VITAMIN D) 2000 UNITS tablet Take 2,000 Units by mouth daily.      [name] donepezil (ARICEPT) 5 MG tablet Take 5 mg by mouth at bedtime.       [name] esomeprazole (NEXIUM) 40 MG capsule Take 40 mg by mouth daily before breakfast.        . ezetimibe (ZETIA) 10 MG tablet Take 10 mg by mouth daily.        . ferrous sulfate 325 (65 FE) MG tablet Take 325 mg by mouth 2 (two) times daily.      . fluticasone (FLONASE) 50 MCG/ACT nasal spray Place 2 sprays into the nose daily as needed for rhinitis.       [name] guaiFENesin (MUCINEX) 600 MG 12 hr tablet Take 2 tablets (1,200 mg total) by mouth 2 (two) times daily.      [name] levothyroxine (SYNTHROID, LEVOTHROID) 88 MCG tablet Take 88 mcg by mouth daily before breakfast.       . magnesium oxide (MAG-OX) 400 (241.3 MG) MG tablet Take 1 tablet (400 mg total) by mouth 2 (two) times daily. For 7 days      . Multiple Vitamin (MULTIVITAMIN) tablet Take 1 tablet by mouth daily.      . rosuvastatin (CRESTOR) 5 MG tablet Take 5 mg by mouth at bedtime.       . vitamin C (ASCORBIC ACID) 500 MG tablet Take 500 mg by mouth daily.       No current facility-administered medications for this encounter.     ALLERGIES: Bactrim; Ciprofloxacin; Penicillins; Welchol; and Zocor   LABORATORY DATA:  Lab Results  Component Value Date   WBC 6.0 01/21/2013   HGB 8.4* 01/21/2013   HCT 26.5* 01/21/2013   MCV 89.8 01/21/2013   PLT 368 01/21/2013   Lab Results  Component Value Date   NA 136 01/21/2013   K 3.3* 01/21/2013   CL 97  01/21/2013   CO2 35* 01/21/2013   Lab Results  Component Value Date   ALT 15 01/17/2013   AST 24 01/17/2013   ALKPHOS 90 01/17/2013   BILITOT 0.4 01/17/2013     NARRATIVE: Peggy Espinoza was seen today for weekly treatment management. The chart was checked and the patient's films were reviewed. The patient is doing well. Really no complaints at this time. He does have an occasional cough. No esophagitis.  PHYSICAL EXAMINATION: weight is 152 lb 1.6 oz (68.992 kg). Her oral temperature is 97.6 F (36.4 C). Her blood pressure is 126/75 and her pulse is 100. Her respiration is 20 and oxygen saturation is 100%.        ASSESSMENT: The patient is doing satisfactorily with treatment.  PLAN: We will continue with the patient's radiation treatment as planned.

## 2013-03-16 NOTE — Progress Notes (Signed)
Weekly rad txs, 18/30 rt lung completed, coughs up thick mucous, yellowish just started, clear most times, mostly after sleeping at night, appetite good, no difficulty swallowing no pain 11:00 AM

## 2013-03-19 ENCOUNTER — Ambulatory Visit
Admission: RE | Admit: 2013-03-19 | Discharge: 2013-03-19 | Disposition: A | Discharge status: Home / Self Care | Payer: Medicare Other | Source: Ambulatory Visit | Attending: Radiation Oncology | Admitting: Radiation Oncology

## 2013-03-19 ENCOUNTER — Ambulatory Visit: Payer: Medicare Other

## 2013-03-20 ENCOUNTER — Ambulatory Visit
Admission: RE | Admit: 2013-03-20 | Discharge: 2013-03-20 | Disposition: A | Discharge status: Home / Self Care | Payer: Medicare Other | Source: Ambulatory Visit | Attending: Radiation Oncology | Admitting: Radiation Oncology

## 2013-03-20 ENCOUNTER — Ambulatory Visit: Payer: Medicare Other

## 2013-03-21 ENCOUNTER — Ambulatory Visit: Payer: Medicare Other

## 2013-03-21 ENCOUNTER — Ambulatory Visit
Admission: RE | Admit: 2013-03-21 | Discharge: 2013-03-21 | Disposition: A | Discharge status: Home / Self Care | Payer: Medicare Other | Source: Ambulatory Visit | Attending: Radiation Oncology | Admitting: Radiation Oncology

## 2013-03-23 ENCOUNTER — Ambulatory Visit
Admission: RE | Admit: 2013-03-23 | Discharge: 2013-03-23 | Disposition: A | Discharge status: Home / Self Care | Payer: Medicare Other | Source: Ambulatory Visit | Attending: Radiation Oncology | Admitting: Radiation Oncology

## 2013-03-23 ENCOUNTER — Ambulatory Visit: Payer: Medicare Other

## 2013-03-23 ENCOUNTER — Encounter: Payer: Self-pay | Admitting: Radiation Oncology

## 2013-03-23 VITALS — BP 117/73 | HR 100 | Temp 97.6°F | Resp 20 | Wt 149.6 lb

## 2013-03-23 DIAGNOSIS — C3490 Malignant neoplasm of unspecified part of unspecified bronchus or lung: Secondary | ICD-10-CM

## 2013-03-23 NOTE — Progress Notes (Signed)
wekly rad txs  Rt lung  22/30 completd 97% rom air , no c/o coughing, no sob, no nausea, eating well, does have whitish pheglm in mornings,, no c/o pain,  In good spirits 10:55 AM

## 2013-03-23 NOTE — Progress Notes (Signed)
  Radiation Oncology         ([phone]) [phone] ________________________________  Name: Peggy Espinoza MRN: [id number]  Date: 03/23/2013  DOB: December 02, 1929  Weekly Radiation Therapy Management  Current Dose: 44 Gy     Planned Dose:  60 Gy  Narrative . . . . . . . . The patient presents for routine under treatment assessment.  no c/o coughing, no sob, no nausea, eating well, does have whitish pheglm in mornings,, no c/o pain, In good spirits                                   The patient is without complaint.                                 Set-up films were reviewed.                                 The chart was checked. Physical Findings. . .  weight is 149 lb 9.6 oz (67.858 kg). Her oral temperature is 97.6 F (36.4 C). Her blood pressure is 117/73 and her pulse is 100. Her respiration is 20 and oxygen saturation is 97%. . Weight essentially stable.  No significant changes. Impression . . . . . . . The patient is tolerating radiation. Plan . . . . . . . . . . . . Continue treatment as planned.  ________________________________  Artist Pais. Kathrynn Running, M.D.

## 2013-03-26 ENCOUNTER — Other Ambulatory Visit: Payer: Self-pay | Admitting: Radiation Oncology

## 2013-03-26 ENCOUNTER — Ambulatory Visit
Admission: RE | Admit: 2013-03-26 | Discharge: 2013-03-26 | Disposition: A | Discharge status: Home / Self Care | Payer: Medicare Other | Source: Ambulatory Visit | Attending: Radiation Oncology | Admitting: Radiation Oncology

## 2013-03-26 ENCOUNTER — Ambulatory Visit: Payer: Medicare Other

## 2013-03-26 DIAGNOSIS — R21 Rash and other nonspecific skin eruption: Secondary | ICD-10-CM

## 2013-03-26 MED ORDER — NYSTATIN-TRIAMCINOLONE 100000-0.1 UNIT/GM-% EX OINT: 1.0000 "application " | TOPICAL_OINTMENT | Freq: Two times a day (BID) | CUTANEOUS | Status: DC

## 2013-03-27 ENCOUNTER — Ambulatory Visit: Payer: Medicare Other

## 2013-03-27 ENCOUNTER — Ambulatory Visit
Admission: RE | Admit: 2013-03-27 | Discharge: 2013-03-27 | Disposition: A | Discharge status: Home / Self Care | Payer: Medicare Other | Source: Ambulatory Visit | Attending: Radiation Oncology | Admitting: Radiation Oncology

## 2013-03-28 ENCOUNTER — Ambulatory Visit: Payer: Medicare Other

## 2013-03-28 ENCOUNTER — Ambulatory Visit
Admission: RE | Admit: 2013-03-28 | Discharge: 2013-03-28 | Disposition: A | Discharge status: Home / Self Care | Payer: Medicare Other | Source: Ambulatory Visit | Attending: Radiation Oncology | Admitting: Radiation Oncology

## 2013-03-30 ENCOUNTER — Encounter: Payer: Self-pay | Admitting: Radiation Oncology

## 2013-03-30 ENCOUNTER — Ambulatory Visit
Admission: RE | Admit: 2013-03-30 | Discharge: 2013-03-30 | Disposition: A | Discharge status: Home / Self Care | Payer: Medicare Other | Source: Ambulatory Visit | Attending: Radiation Oncology | Admitting: Radiation Oncology

## 2013-03-30 ENCOUNTER — Ambulatory Visit: Payer: Medicare Other

## 2013-03-30 NOTE — Progress Notes (Signed)
Weekly rad txs, rt lung 26/30 completed, 96% room air sats, gained 2 lbs since last week, no c/o pain or difficulty swallowing, slight fatigue 11:07 AM

## 2013-03-30 NOTE — Progress Notes (Signed)
   Department of Radiation Oncology  Phone:  [phone] Fax:        [phone]  Weekly Treatment Note    Name: Peggy Espinoza Date: 03/30/2013 MRN: [id number] DOB: 1929/12/26   Current dose: 52 Gy  Current fraction: 26   MEDICATIONS: Current Outpatient Prescriptions  Medication Sig Dispense Refill  . acetaminophen (TYLENOL) 325 MG tablet Take 2 tablets (650 mg total) by mouth every 6 (six) hours as needed for fever.      . Cholecalciferol (VITAMIN D) 2000 UNITS tablet Take 2,000 Units by mouth daily.      [name] donepezil (ARICEPT) 5 MG tablet Take 5 mg by mouth at bedtime.       [name] esomeprazole (NEXIUM) 40 MG capsule Take 40 mg by mouth daily before breakfast.        . ezetimibe (ZETIA) 10 MG tablet Take 10 mg by mouth daily.        . ferrous sulfate 325 (65 FE) MG tablet Take 325 mg by mouth 2 (two) times daily.      . fluticasone (FLONASE) 50 MCG/ACT nasal spray Place 2 sprays into the nose daily as needed for rhinitis.       [name] guaiFENesin (MUCINEX) 600 MG 12 hr tablet Take 2 tablets (1,200 mg total) by mouth 2 (two) times daily.      [name] levothyroxine (SYNTHROID, LEVOTHROID) 88 MCG tablet Take 88 mcg by mouth daily before breakfast.       . magnesium oxide (MAG-OX) 400 (241.3 MG) MG tablet Take 1 tablet (400 mg total) by mouth 2 (two) times daily. For 7 days      . Multiple Vitamin (MULTIVITAMIN) tablet Take 1 tablet by mouth daily.      [name] nystatin-triamcinolone ointment (MYCOLOG) Apply 1 application topically 2 (two) times daily. To affected area  30 g  0  . rosuvastatin (CRESTOR) 5 MG tablet Take 5 mg by mouth at bedtime.       . vitamin C (ASCORBIC ACID) 500 MG tablet Take 500 mg by mouth daily.       No current facility-administered medications for this encounter.     ALLERGIES: Bactrim; Ciprofloxacin; Penicillins; Welchol; and Zocor   LABORATORY DATA:  Lab Results  Component Value Date   WBC 6.0 01/21/2013   HGB 8.4* 01/21/2013   HCT 26.5* 01/21/2013   MCV 89.8  01/21/2013   PLT 368 01/21/2013   Lab Results  Component Value Date   NA 136 01/21/2013   K 3.3* 01/21/2013   CL 97 01/21/2013   CO2 35* 01/21/2013   Lab Results  Component Value Date   ALT 15 01/17/2013   AST 24 01/17/2013   ALKPHOS 90 01/17/2013   BILITOT 0.4 01/17/2013     NARRATIVE: Peggy Espinoza was seen today for weekly treatment management. The chart was checked and the patient's films were reviewed. The patient is doing very well. No new complaints this week except for some slight fatigue. No esophagitis.  PHYSICAL EXAMINATION: weight is 151 lb 11.2 oz (68.811 kg). Her oral temperature is 97.4 F (36.3 C). Her blood pressure is 116/75 and her pulse is 109. Her respiration is 20.        ASSESSMENT: The patient is doing satisfactorily with treatment.  PLAN: We will continue with the patient's radiation treatment as planned.

## 2013-04-02 ENCOUNTER — Ambulatory Visit
Admission: RE | Admit: 2013-04-02 | Discharge: 2013-04-02 | Disposition: A | Discharge status: Home / Self Care | Payer: Medicare Other | Source: Ambulatory Visit | Attending: Radiation Oncology | Admitting: Radiation Oncology

## 2013-04-02 ENCOUNTER — Ambulatory Visit: Payer: Medicare Other

## 2013-04-02 DIAGNOSIS — C3490 Malignant neoplasm of unspecified part of unspecified bronchus or lung: Secondary | ICD-10-CM | POA: Insufficient documentation

## 2013-04-02 DIAGNOSIS — Z79899 Other long term (current) drug therapy: Secondary | ICD-10-CM | POA: Insufficient documentation

## 2013-04-02 DIAGNOSIS — Z510 Encounter for antineoplastic radiation therapy: Secondary | ICD-10-CM | POA: Insufficient documentation

## 2013-04-03 ENCOUNTER — Ambulatory Visit: Payer: Medicare Other

## 2013-04-03 ENCOUNTER — Ambulatory Visit
Admission: RE | Admit: 2013-04-03 | Discharge: 2013-04-03 | Disposition: A | Discharge status: Home / Self Care | Payer: Medicare Other | Source: Ambulatory Visit | Attending: Radiation Oncology | Admitting: Radiation Oncology

## 2013-04-04 ENCOUNTER — Ambulatory Visit: Payer: Medicare Other

## 2013-04-04 ENCOUNTER — Ambulatory Visit
Admission: RE | Admit: 2013-04-04 | Discharge: 2013-04-04 | Disposition: A | Discharge status: Home / Self Care | Payer: Medicare Other | Source: Ambulatory Visit | Attending: Radiation Oncology | Admitting: Radiation Oncology

## 2013-04-05 ENCOUNTER — Ambulatory Visit: Payer: Medicare Other

## 2013-04-06 ENCOUNTER — Ambulatory Visit: Payer: Medicare Other

## 2013-04-06 ENCOUNTER — Ambulatory Visit
Admission: RE | Admit: 2013-04-06 | Discharge: 2013-04-06 | Disposition: A | Discharge status: Home / Self Care | Payer: Medicare Other | Source: Ambulatory Visit | Attending: Radiation Oncology | Admitting: Radiation Oncology

## 2013-04-06 NOTE — Progress Notes (Signed)
Peggy Espinoza has had 31 fractions to her right lung.  She denies pain except for occasional pain in her left shoulder.  She reports a "knot" in her left arm that appeared a month ago.  She reports a good appetite.  She has gained 3 lbs since last week.  She denies a sore throat, shortness of breath, cough and fatigue.  She has slight hyperpigmentation on her mid chest.  She is using biafine cream.

## 2013-04-06 NOTE — Progress Notes (Signed)
Department of Radiation Oncology  Phone:  [phone] Fax:        [phone]  Weekly Treatment Note    Name: Peggy Espinoza Date: 04/06/2013 MRN: [id number] DOB: 1929-06-10   Current dose: 62 Gy  Current fraction:31   MEDICATIONS: Current Outpatient Prescriptions  Medication Sig Dispense Refill  . Cholecalciferol (VITAMIN D) 2000 UNITS tablet Take 2,000 Units by mouth daily.      [name] donepezil (ARICEPT) 5 MG tablet Take 5 mg by mouth at bedtime.       [name] esomeprazole (NEXIUM) 40 MG capsule Take 40 mg by mouth daily before breakfast.        . ezetimibe (ZETIA) 10 MG tablet Take 10 mg by mouth daily.        . ferrous sulfate 325 (65 FE) MG tablet Take 325 mg by mouth 2 (two) times daily.      . fluticasone (FLONASE) 50 MCG/ACT nasal spray Place 2 sprays into the nose daily as needed for rhinitis.       [name] guaiFENesin (MUCINEX) 600 MG 12 hr tablet Take 2 tablets (1,200 mg total) by mouth 2 (two) times daily.      [name] levothyroxine (SYNTHROID, LEVOTHROID) 88 MCG tablet Take 88 mcg by mouth daily before breakfast.       . magnesium oxide (MAG-OX) 400 (241.3 MG) MG tablet Take 1 tablet (400 mg total) by mouth 2 (two) times daily. For 7 days      . Multiple Vitamin (MULTIVITAMIN) tablet Take 1 tablet by mouth daily.      [name] nystatin-triamcinolone ointment (MYCOLOG) Apply 1 application topically 2 (two) times daily. To affected area  30 g  0  . rosuvastatin (CRESTOR) 5 MG tablet Take 5 mg by mouth at bedtime.       . vitamin C (ASCORBIC ACID) 500 MG tablet Take 500 mg by mouth daily.      [name] acetaminophen (TYLENOL) 325 MG tablet Take 2 tablets (650 mg total) by mouth every 6 (six) hours as needed for fever.       No current facility-administered medications for this encounter.     ALLERGIES: Bactrim; Ciprofloxacin; Penicillins; Welchol; and Zocor   LABORATORY DATA:  Lab Results  Component Value Date   WBC 6.0 01/21/2013   HGB 8.4* 01/21/2013   HCT 26.5* 01/21/2013   MCV 89.8  01/21/2013   PLT 368 01/21/2013   Lab Results  Component Value Date   NA 136 01/21/2013   K 3.3* 01/21/2013   CL 97 01/21/2013   CO2 35* 01/21/2013   Lab Results  Component Value Date   ALT 15 01/17/2013   AST 24 01/17/2013   ALKPHOS 90 01/17/2013   BILITOT 0.4 01/17/2013     NARRATIVE: Peggy Espinoza was seen today for weekly treatment management. The chart was checked and the patient's films were reviewed. He is is doing fairly well. No esophagitis which is worse. No change in shortness of breath.  PHYSICAL EXAMINATION: height is 5' 3.5" (1.613 m) and weight is 154 lb 11.2 oz (70.171 kg). Her temperature is 97.8 F (36.6 C). Her blood pressure is 127/75 and her pulse is 97. Her oxygen saturation is 97%.      the patient pointed out a knot in the left arm. This does not feel worrisome to me on exam today for anything related to her cancer. Some tenderness and firmness in the left upper extremity but no real discrete nodule. This appears in the muscle.  The patient will discuss with her primary care physician if this persists.  ASSESSMENT: The patient is doing satisfactorily with treatment.  PLAN: We will continue with the patient's radiation treatment as planned.

## 2013-04-09 ENCOUNTER — Ambulatory Visit
Admission: RE | Admit: 2013-04-09 | Discharge: 2013-04-09 | Disposition: A | Discharge status: Home / Self Care | Payer: Medicare Other | Source: Ambulatory Visit | Attending: Radiation Oncology | Admitting: Radiation Oncology

## 2013-04-10 ENCOUNTER — Ambulatory Visit
Admission: RE | Admit: 2013-04-10 | Discharge: 2013-04-10 | Disposition: A | Discharge status: Home / Self Care | Payer: Medicare Other | Source: Ambulatory Visit | Attending: Radiation Oncology | Admitting: Radiation Oncology

## 2013-04-10 ENCOUNTER — Ambulatory Visit: Payer: Medicare Other

## 2013-04-11 ENCOUNTER — Ambulatory Visit
Admission: RE | Admit: 2013-04-11 | Discharge: 2013-04-11 | Disposition: A | Discharge status: Home / Self Care | Payer: Medicare Other | Source: Ambulatory Visit | Attending: Radiation Oncology | Admitting: Radiation Oncology

## 2013-04-11 ENCOUNTER — Ambulatory Visit: Payer: Medicare Other

## 2013-04-12 ENCOUNTER — Encounter: Payer: Self-pay | Admitting: Radiation Oncology

## 2013-04-12 ENCOUNTER — Ambulatory Visit
Admission: RE | Admit: 2013-04-12 | Discharge: 2013-04-12 | Disposition: A | Discharge status: Home / Self Care | Payer: Medicare Other | Source: Ambulatory Visit | Attending: Radiation Oncology | Admitting: Radiation Oncology

## 2013-04-12 NOTE — Progress Notes (Signed)
Weekly rad txs rt lung 35/35, no coughing no sob, 97% room air sats, no pain, eot, 1 month f/u appt card given,  Mo fatigue stated 11:11 AM

## 2013-04-18 NOTE — Progress Notes (Signed)
  Radiation Oncology         [phone] ________________________________  Name: KERRIGAN GOMBOS MRN: [id number]  Date: 04/12/2013  DOB: 17-Apr-1929  End of Treatment Note  Diagnosis:   Non small cell lung cancer     Indication for treatment:  Curative       Radiation treatment dates:   01/08/2013 - 04/12/2013  Site/dose:   The patient was treated initially to a dose of 60 Gy using a 5-field, 3D conformal technique. This targeted the gross tumor and high-risk nodal regions. The patient then received cone-down boost treatment for an additional 10 Gy in 5 fractions using a 4-field technique. The total dose was 70 Gy.  Narrative: The patient tolerated radiation treatment relatively well.   She was hospitalized after the 1st week of treatment, leading to a significant break in her treatment. She was able to resume treatment after she recovered, and she ultimately was able to complete her prescribed course without additional unanticipated difficulties.  Plan: The patient has completed radiation treatment. The patient will return to radiation oncology clinic for routine followup in one month. I advised the patient to call or return sooner if they have any questions or concerns related to their recovery or treatment. ________________________________  Jodelle Gross, M.D., Ph.D.

## 2013-05-15 ENCOUNTER — Encounter: Payer: Self-pay | Admitting: Radiation Oncology

## 2013-05-16 ENCOUNTER — Encounter: Payer: Self-pay | Admitting: Radiation Oncology

## 2013-05-16 ENCOUNTER — Ambulatory Visit
Admission: RE | Admit: 2013-05-16 | Discharge: 2013-05-16 | Disposition: A | Discharge status: Home / Self Care | Payer: Medicare Other | Source: Ambulatory Visit | Attending: Radiation Oncology | Admitting: Radiation Oncology

## 2013-05-16 VITALS — BP 118/66 | HR 103 | Temp 97.9°F | Resp 20 | Ht 63.5 in | Wt 157.3 lb

## 2013-05-16 DIAGNOSIS — C3410 Malignant neoplasm of upper lobe, unspecified bronchus or lung: Secondary | ICD-10-CM

## 2013-05-16 HISTORY — DX: Personal history of irradiation: Z92.3

## 2013-05-16 NOTE — Progress Notes (Signed)
Follow up [facility], 01/08/13-04/12/13 , 96% room air, sats, nop c/o pain, or nausea, no sob, occasional wheezing stated,bowels regular, energy level good, appetite good, has gained weight back 1:37 PM  1:36 PM

## 2013-05-16 NOTE — Progress Notes (Signed)
Radiation Oncology         [phone] ________________________________  Name: Peggy Espinoza MRN: [id number]  Date: 05/16/2013  DOB: [birth year]  Follow-Up Visit Note  CC: Leonides Grills, MD  Melrose Nakayama, *  Diagnosis:   Non-small cell lung cancer, stage IIIA  Interval Since Last Radiation:  One month   Narrative:  The patient returns today for routine follow-up.  The patient returns to clinic today for followup after having completed her course of radiation treatment on 04/12/2013. The patient states she has done well since that time. No change in shortness of breath. No esophagitis at this time. The patient's skin has done well. No new complaints today other than some occasional wheezing which she notices at night.                              ALLERGIES:  is allergic to bactrim; ciprofloxacin; penicillins; welchol; and zocor.  Meds: Current Outpatient Prescriptions  Medication Sig Dispense Refill  . acetaminophen (TYLENOL) 325 MG tablet Take 2 tablets (650 mg total) by mouth every 6 (six) hours as needed for fever.      . Cholecalciferol (VITAMIN D) 2000 UNITS tablet Take 2,000 Units by mouth daily.      [name] donepezil (ARICEPT) 5 MG tablet Take 5 mg by mouth at bedtime.       [name] esomeprazole (NEXIUM) 40 MG capsule Take 40 mg by mouth daily before breakfast.        . ezetimibe (ZETIA) 10 MG tablet Take 10 mg by mouth daily.        . ferrous sulfate 325 (65 FE) MG tablet Take 325 mg by mouth 2 (two) times daily.      . fluticasone (FLONASE) 50 MCG/ACT nasal spray Place 2 sprays into the nose daily as needed for rhinitis.       [name] guaiFENesin (MUCINEX) 600 MG 12 hr tablet Take 2 tablets (1,200 mg total) by mouth 2 (two) times daily.      [name] levothyroxine (SYNTHROID, LEVOTHROID) 88 MCG tablet Take 88 mcg by mouth daily before breakfast.       . magnesium oxide (MAG-OX) 400 (241.3 MG) MG tablet Take 1 tablet (400 mg total) by mouth 2 (two) times daily. For 7 days      . Multiple  Vitamin (MULTIVITAMIN) tablet Take 1 tablet by mouth daily.      [name] nystatin-triamcinolone ointment (MYCOLOG) Apply 1 application topically 2 (two) times daily. To affected area  30 g  0  . rosuvastatin (CRESTOR) 5 MG tablet Take 5 mg by mouth at bedtime.       . vitamin C (ASCORBIC ACID) 500 MG tablet Take 500 mg by mouth daily.       No current facility-administered medications for this encounter.    Physical Findings: The patient is in no acute distress. Patient is alert and oriented.  height is 5' 3.5" (1.613 m) and weight is 157 lb 4.8 oz (71.351 kg). Her oral temperature is 97.9 F (36.6 C). Her blood pressure is 118/66 and her pulse is 103. Her respiration is 20 and oxygen saturation is 96%. .   General: Well-developed, in no acute distress HEENT: Normocephalic, atraumatic Cardiovascular: Regular rate and rhythm Respiratory: Clear to auscultation bilaterally GI: Soft, nontender, normal bowel sounds Extremities: No edema present   Lab Findings: Lab Results  Component Value Date   WBC 6.0 01/21/2013   HGB 8.4*  01/21/2013   HCT 26.5* 01/21/2013   MCV 89.8 01/21/2013   PLT 368 01/21/2013     Radiographic Findings: No results found.  Impression:    The patient has done well after completing radiation treatment. She began radiation with concurrent chemotherapy but was hospitalized and the patient decided not to proceed with any additional chemotherapy. I discussed the patient is seeing Dr. Julien Nordmann again in medical oncology. They wanted to hold off on this for the time being and we will discuss this at her next visit.   Plan:  The patient will proceed with a CT scan of the chest and I will see her back for followup in 2 weeks.   Jodelle Gross, M.D., Ph.D.

## 2013-05-17 ENCOUNTER — Telehealth: Payer: Self-pay | Admitting: *Deleted

## 2013-05-17 NOTE — Telephone Encounter (Signed)
CALLED PATIENT TO INFORM OF TEST FOR 05-25-13, SPOKE WITH PATIENT'S DAUGHTER, LINDA CAIN AND SHE IS AWARE OF THIS APPT.

## 2013-05-25 ENCOUNTER — Ambulatory Visit (HOSPITAL_COMMUNITY)
Admission: RE | Admit: 2013-05-25 | Discharge: 2013-05-25 | Disposition: A | Discharge status: Home / Self Care | Payer: Medicare Other | Source: Ambulatory Visit | Attending: Radiation Oncology | Admitting: Radiation Oncology

## 2013-05-25 ENCOUNTER — Ambulatory Visit: Payer: Medicare Other

## 2013-05-25 ENCOUNTER — Encounter (HOSPITAL_COMMUNITY): Payer: Self-pay

## 2013-05-25 DIAGNOSIS — Z923 Personal history of irradiation: Secondary | ICD-10-CM | POA: Insufficient documentation

## 2013-05-25 DIAGNOSIS — Z9221 Personal history of antineoplastic chemotherapy: Secondary | ICD-10-CM | POA: Insufficient documentation

## 2013-05-25 DIAGNOSIS — I2510 Atherosclerotic heart disease of native coronary artery without angina pectoris: Secondary | ICD-10-CM | POA: Insufficient documentation

## 2013-05-25 DIAGNOSIS — C3490 Malignant neoplasm of unspecified part of unspecified bronchus or lung: Secondary | ICD-10-CM | POA: Insufficient documentation

## 2013-05-25 DIAGNOSIS — C3410 Malignant neoplasm of upper lobe, unspecified bronchus or lung: Secondary | ICD-10-CM

## 2013-05-25 MED ORDER — IOHEXOL 300 MG/ML  SOLN
80.0000 mL | Freq: Once | INTRAMUSCULAR | Status: AC | PRN
  Administered 2013-05-25: 80 mL via INTRAVENOUS

## 2013-05-28 ENCOUNTER — Ambulatory Visit
Admission: RE | Admit: 2013-05-28 | Discharge: 2013-05-28 | Disposition: A | Discharge status: Home / Self Care | Payer: Medicare Other | Source: Ambulatory Visit | Attending: Radiation Oncology | Admitting: Radiation Oncology

## 2013-05-28 ENCOUNTER — Telehealth: Payer: Self-pay | Admitting: *Deleted

## 2013-05-28 DIAGNOSIS — C3410 Malignant neoplasm of upper lobe, unspecified bronchus or lung: Secondary | ICD-10-CM

## 2013-05-28 NOTE — Telephone Encounter (Signed)
Daughter Peggy Espinoza called asking if it was okay for just her to come to see Dr,moody to get results of her mother's Ct scan result"I have to go get an echo at 1pm and then would have to drive back to [place] and pick up mother, she doesn't understand  Any og this anyways  and always has me here to explain ", spoke with Dr.Moody, okay for just the daughter to come at folloe up ct reulsts, called daughter Peggy Espinoza back at [phone] informed her to come by herself, "Peggy Espinoza siad to thank Dr.Moody and me for calling back so soon" .11:41 AM

## 2013-05-29 ENCOUNTER — Telehealth: Payer: Self-pay | Admitting: *Deleted

## 2013-05-29 NOTE — Telephone Encounter (Signed)
Called patient to inform of test and fu visit, lvm for a return call

## 2013-05-29 NOTE — Progress Notes (Signed)
The patient's daughter came in today to discuss her recent CT scan of the chest. This showed some improvement in the size of the right sided lung mass. Stable mediastinal lymph node present. I discussed this with her. I discussed once again my recommendation for the patient to be seen by medical oncology. However, the patient's daughter indicated again that the patient did not wish to do so at this time. We therefore discussed continued followup which will include a CT scan of the chest in 3 months. She indicated that the patient understands that she may need to see medical oncology at some point with progression of her stage III non-small cell lung cancer.

## 2013-08-09 ENCOUNTER — Telehealth: Payer: Self-pay | Admitting: *Deleted

## 2013-08-09 NOTE — Telephone Encounter (Signed)
xxxxx 

## 2013-08-09 NOTE — Telephone Encounter (Signed)
Called patient to ask a question about labs for her mom, spoke with patient's daughter, Peggy Espinoza and she said that she had them done @ Dr. Glo [facility].

## 2013-08-13 ENCOUNTER — Telehealth: Payer: Self-pay | Admitting: *Deleted

## 2013-08-13 NOTE — Telephone Encounter (Signed)
Daughter Heywood Iles called saying her mother Peggy Espinoza was doing great and has a CT scan scheduled in June and if it is bad to not tell the patient to call Eber Jones stated it would blow her mother out of the water" and asked about the lab work that was done on 08/06/13, I will convey her message to the Dr. But it will be up to him to discuss  This with patient and or daughter 12:37 PM

## 2013-08-29 ENCOUNTER — Ambulatory Visit (HOSPITAL_COMMUNITY)
Admission: RE | Admit: 2013-08-29 | Discharge: 2013-08-29 | Disposition: A | Discharge status: Home / Self Care | Payer: Medicare Other | Source: Ambulatory Visit | Attending: Radiation Oncology | Admitting: Radiation Oncology

## 2013-08-29 ENCOUNTER — Encounter (HOSPITAL_COMMUNITY): Payer: Self-pay

## 2013-08-29 ENCOUNTER — Encounter: Payer: Self-pay | Admitting: Radiation Oncology

## 2013-08-29 DIAGNOSIS — C3410 Malignant neoplasm of upper lobe, unspecified bronchus or lung: Secondary | ICD-10-CM | POA: Insufficient documentation

## 2013-08-29 MED ORDER — IOHEXOL 300 MG/ML  SOLN
80.0000 mL | Freq: Once | INTRAMUSCULAR | Status: AC | PRN
  Administered 2013-08-29: 80 mL via INTRAVENOUS

## 2013-08-30 ENCOUNTER — Encounter: Payer: Self-pay | Admitting: Radiation Oncology

## 2013-08-30 ENCOUNTER — Ambulatory Visit
Admission: RE | Admit: 2013-08-30 | Discharge: 2013-08-30 | Disposition: A | Discharge status: Home / Self Care | Payer: Medicare Other | Source: Ambulatory Visit | Attending: Radiation Oncology | Admitting: Radiation Oncology

## 2013-08-30 VITALS — BP 117/81 | HR 102 | Temp 98.6°F | Resp 20 | Wt 156.0 lb

## 2013-08-30 DIAGNOSIS — C3410 Malignant neoplasm of upper lobe, unspecified bronchus or lung: Secondary | ICD-10-CM

## 2013-08-30 HISTORY — DX: Personal history of irradiation: Z92.3

## 2013-08-30 NOTE — Progress Notes (Signed)
Follow up  Here for Ct results 08/29/13,  95% room air sats, no c/o sob, appetite good, drinking well, increased her synthroiid to 125 mcg  From 88 mcg, was sluggish and tired all the time,  until increase of medication, decreased her zetia to 5mg  daily had radiation tx 01/08/13-04/12/13, lung

## 2013-08-31 ENCOUNTER — Telehealth: Payer: Self-pay | Admitting: *Deleted

## 2013-08-31 NOTE — Telephone Encounter (Signed)
Peggy Espinoza, patients daughter called and requested Dr.Moody call her sometime today at [phone], she didn't fully understand what was told to her about her mother, her mother is upset and anxious since last visit yestedray, she knows hwe said "It growed" but doesn't fully understand and will wait for his call back,will inbasket MD told Peggy Espinoza is seeing his under treats today and it might be this afternoon before he can get back with her,She stated"That's fine, he can call anytime" 10:31 AM

## 2013-09-02 NOTE — Progress Notes (Signed)
Radiation Oncology         [phone] ________________________________  Name: Peggy Espinoza MRN: [id number]  Date: 08/30/2013  DOB: 07/05/1929  Follow-Up Visit Note  CC: Leonides Grills, MD  Melrose Nakayama, *  Diagnosis:   Non-small cell lung cancer, stage IIIa  Interval Since Last Radiation:  The patient completed radiation treatment in January of 2015   Narrative:  The patient returns today for routine follow-up.  The patient states that she clinically is doing well at this time. She denies any worsening shortness of breath. No esophagitis or chest pain at this time. The patient's appetite has been good. She had a repeat CT scan of the chest on 08/29/2013 and comes in today to review the results.                              ALLERGIES:  is allergic to bactrim; ciprofloxacin; penicillins; welchol; and zocor.  Meds: Current Outpatient Prescriptions  Medication Sig Dispense Refill  . acetaminophen (TYLENOL) 325 MG tablet Take 2 tablets (650 mg total) by mouth every 6 (six) hours as needed for fever.      . Cholecalciferol (VITAMIN D) 2000 UNITS tablet Take 2,000 Units by mouth daily.      [name] donepezil (ARICEPT) 5 MG tablet Take 5 mg by mouth at bedtime.       [name] esomeprazole (NEXIUM) 40 MG capsule Take 40 mg by mouth daily before breakfast.        . ezetimibe (ZETIA) 10 MG tablet Take 5 mg by mouth.       . ferrous sulfate 325 (65 FE) MG tablet Take 325 mg by mouth 2 (two) times daily.      . fluticasone (FLONASE) 50 MCG/ACT nasal spray Place 2 sprays into the nose daily as needed for rhinitis.       [name] guaiFENesin (MUCINEX) 600 MG 12 hr tablet Take 2 tablets (1,200 mg total) by mouth 2 (two) times daily.      [name] levothyroxine (SYNTHROID, LEVOTHROID) 88 MCG tablet 125 mcg daily before breakfast.       . magnesium oxide (MAG-OX) 400 (241.3 MG) MG tablet Take 1 tablet (400 mg total) by mouth 2 (two) times daily. For 7 days      . Multiple Vitamin (MULTIVITAMIN) tablet Take 1 tablet  by mouth daily.      [name] nystatin-triamcinolone ointment (MYCOLOG) Apply 1 application topically 2 (two) times daily. To affected area  30 g  0  . rosuvastatin (CRESTOR) 5 MG tablet Take 5 mg by mouth at bedtime.       . vitamin C (ASCORBIC ACID) 500 MG tablet Take 500 mg by mouth daily.       No current facility-administered medications for this encounter.    Physical Findings: The patient is in no acute distress. Patient is alert and oriented.  weight is 156 lb (70.761 kg). Her oral temperature is 98.6 F (37 C). Her blood pressure is 117/81 and her pulse is 102. Her respiration is 20 and oxygen saturation is 95%. .   General: Well-developed, in no acute distress HEENT: Normocephalic, atraumatic Cardiovascular: Regular rate and rhythm Respiratory: Clear to auscultation bilaterally GI: Soft, nontender, normal bowel sounds Extremities: No edema present   Lab Findings: Lab Results  Component Value Date   WBC 6.0 01/21/2013   HGB 8.4* 01/21/2013   HCT 26.5* 01/21/2013   MCV 89.8 01/21/2013   PLT  368 01/21/2013     Radiographic Findings: Ct Chest W Contrast  08/29/2013   CLINICAL DATA:  Lung cancer.  EXAM: CT CHEST WITH CONTRAST  TECHNIQUE: Multidetector CT imaging of the chest was performed during intravenous contrast administration.  CONTRAST:  40mL OMNIPAQUE IOHEXOL 300 MG/ML  SOLN  COMPARISON:  Chest CT from 05/25/2013 and 11/01/2012. PET-CT from 11/16/2012.  FINDINGS: Soft tissue / Mediastinum: There is no axillary lymphadenopathy. Upper normal left paratracheal lymph nodes are not changed in the interval. The 8 mm index subcarinal lymph node measured on the previous study has increased slightly to 10 mm in short axis on today's exam. 10 mm short axis subcarinal lymph node on today's study was 7 mm in short axis previously.  2.8 x 2.2 cm irregular soft tissue nodule just anterior to the right hilum has increased in size in the interval. This area was measured on lung windows  previously but in the interval since the prior study there has been development of perilesional airspace disease on the current exam making reliable reproducible measurement on lung windows difficult. For that reason, I have remeasured the lesion on soft tissue windows on the previous exam and it measures 2.2 x 1.8 cm on that study.  No left hilar lymphadenopathy. The heart size is normal. Coronary artery calcification is noted. Thoracic esophagus is unremarkable. There is a small hiatal hernia.  Lungs / Pleura: Interval progression of patchy airspace disease around the right lung lesion with areas of more peripheral nodular airspace disease in the right lung today.  Subsegmental atelectasis or linear scarring in left lower lobe is unchanged. Small right pleural effusion has progressed in the interval.  Bones: Bone windows reveal no worrisome lytic or sclerotic osseous lesions.  Upper Abdomen:  Unremarkable.  IMPRESSION: Interval increase in patchy and nodular airspace disease around the known right lung mass. This makes comparative measurement on lung windows today (the lesion was measured using lung windows previously) unreliable. As such, the lesion has been measured today and remeasured previously using soft tissue windows. The mass has progressed in the interval measuring 2.8 x 2.2 cm today compared to 2.2 x 1.8 cm previously.  Increase in airspace disease around lesion may be related to tumor spread or postobstructive changes.  Index 8 mm AP window lymph node measured on the previous study is now 10 mm.  Slight increase in size of the small right pleural effusion.   Electronically Signed   By: Misty Stanley M.D.   On: 08/29/2013 15:52    Impression:    The patient's chest CT scan was reviewed with the patient and her daughter. There is some interval enlargement of the dominant lesion within the right lung, now measuring 2.8 cm. We discussed the possible implications of this finding in terms of possible  disease progression. The patient has indicated that she does not wish to undergo any chemotherapy and is not a candidate for further radiation treatment. She also is not a good surgical candidate. This has been her position the last couple of times we have Matt. The patient had a difficult time with chemotherapy during treatment.  Plan:  We discussed possible followup options including further workup and possible management worsens continued observation. The patient has indicated that she does not wish to be aggressive at this time and therefore we will have the patient return to clinic in an additional 4 months after undergoing a repeat CT scan. The patient's daughter had concerns about her mother's ability to handle  any bad news. I tried to discuss the CT scan results in accurate terms while still respecting her daughter's wishes and try to encourage patient.  I spent 15 minutes with the patient today, the majority of which was spent counseling the patient on the diagnosis of cancer and coordinating care.   Jodelle Gross, M.D., Ph.D.

## 2013-09-03 ENCOUNTER — Telehealth: Payer: Self-pay | Admitting: *Deleted

## 2013-09-03 NOTE — Telephone Encounter (Signed)
Peggy Espinoza called this am and said " Dr.Moody did call but he clled her mother's phone, but  please have him call me at [phone], waiting for MD to call her back 9:30 AM  "

## 2013-09-03 NOTE — Telephone Encounter (Signed)
Called patient to inform of lab, test and fu, spoke with patient's daughter Audree Camel) and she is aware of these appts.

## 2013-11-02 ENCOUNTER — Telehealth: Payer: Self-pay | Admitting: *Deleted

## 2013-11-02 NOTE — Telephone Encounter (Signed)
Daughter Audree Camel called asking for Dr.moody to call her she asked How long andher mother has to live and how bad is her cancer, she has a CT scan October 8,2015 and sees Dr.moody a few days after", spoke with [name] stating   " I would have MD call her but doubt he could answer the question of life expectancy , he can tell her what the Ct scan shows after that is done, Daughter still wants MD to call her today ,anytime is fine at [phone], not to call the patient, will e-mail MD,did say MD had patients today and not sure what time he could return her call 9:57 AM

## 2013-11-12 ENCOUNTER — Telehealth: Payer: Self-pay | Admitting: *Deleted

## 2013-11-12 NOTE — Telephone Encounter (Signed)
Audree Camel called and left voice message requesting Dr.Moody call her today,she was sorry she missed his call that Friday 8:52 AM

## 2013-11-17 ENCOUNTER — Encounter (HOSPITAL_COMMUNITY): Payer: Self-pay | Admitting: Emergency Medicine

## 2013-11-17 DIAGNOSIS — J910 Malignant pleural effusion: Secondary | ICD-10-CM | POA: Diagnosis not present

## 2013-11-17 DIAGNOSIS — Z85118 Personal history of other malignant neoplasm of bronchus and lung: Secondary | ICD-10-CM

## 2013-11-17 DIAGNOSIS — R4182 Altered mental status, unspecified: Secondary | ICD-10-CM | POA: Diagnosis not present

## 2013-11-17 DIAGNOSIS — E039 Hypothyroidism, unspecified: Secondary | ICD-10-CM | POA: Diagnosis present

## 2013-11-17 DIAGNOSIS — J189 Pneumonia, unspecified organism: Secondary | ICD-10-CM | POA: Diagnosis present

## 2013-11-17 DIAGNOSIS — D630 Anemia in neoplastic disease: Secondary | ICD-10-CM | POA: Diagnosis present

## 2013-11-17 DIAGNOSIS — E119 Type 2 diabetes mellitus without complications: Secondary | ICD-10-CM | POA: Diagnosis present

## 2013-11-17 DIAGNOSIS — Z880 Allergy status to penicillin: Secondary | ICD-10-CM

## 2013-11-17 DIAGNOSIS — Z833 Family history of diabetes mellitus: Secondary | ICD-10-CM

## 2013-11-17 DIAGNOSIS — F0390 Unspecified dementia without behavioral disturbance: Secondary | ICD-10-CM | POA: Diagnosis present

## 2013-11-17 DIAGNOSIS — I10 Essential (primary) hypertension: Secondary | ICD-10-CM | POA: Diagnosis present

## 2013-11-17 DIAGNOSIS — E785 Hyperlipidemia, unspecified: Secondary | ICD-10-CM | POA: Diagnosis present

## 2013-11-17 DIAGNOSIS — Z923 Personal history of irradiation: Secondary | ICD-10-CM

## 2013-11-17 DIAGNOSIS — C342 Malignant neoplasm of middle lobe, bronchus or lung: Secondary | ICD-10-CM | POA: Diagnosis present

## 2013-11-17 DIAGNOSIS — K5900 Constipation, unspecified: Secondary | ICD-10-CM | POA: Diagnosis present

## 2013-11-17 DIAGNOSIS — J9600 Acute respiratory failure, unspecified whether with hypoxia or hypercapnia: Secondary | ICD-10-CM | POA: Diagnosis present

## 2013-11-17 DIAGNOSIS — G8929 Other chronic pain: Secondary | ICD-10-CM | POA: Diagnosis present

## 2013-11-17 DIAGNOSIS — M25569 Pain in unspecified knee: Secondary | ICD-10-CM | POA: Diagnosis present

## 2013-11-17 DIAGNOSIS — G9340 Encephalopathy, unspecified: Secondary | ICD-10-CM | POA: Diagnosis present

## 2013-11-17 DIAGNOSIS — Z888 Allergy status to other drugs, medicaments and biological substances status: Secondary | ICD-10-CM

## 2013-11-17 DIAGNOSIS — M479 Spondylosis, unspecified: Secondary | ICD-10-CM | POA: Diagnosis present

## 2013-11-17 DIAGNOSIS — Z881 Allergy status to other antibiotic agents status: Secondary | ICD-10-CM

## 2013-11-17 DIAGNOSIS — R630 Anorexia: Secondary | ICD-10-CM | POA: Diagnosis present

## 2013-11-17 DIAGNOSIS — Z66 Do not resuscitate: Secondary | ICD-10-CM | POA: Diagnosis present

## 2013-11-17 DIAGNOSIS — IMO0002 Reserved for concepts with insufficient information to code with codable children: Secondary | ICD-10-CM

## 2013-11-17 DIAGNOSIS — K219 Gastro-esophageal reflux disease without esophagitis: Secondary | ICD-10-CM | POA: Diagnosis present

## 2013-11-17 DIAGNOSIS — M1710 Unilateral primary osteoarthritis, unspecified knee: Secondary | ICD-10-CM | POA: Diagnosis present

## 2013-11-17 DIAGNOSIS — Z87891 Personal history of nicotine dependence: Secondary | ICD-10-CM

## 2013-11-17 NOTE — ED Notes (Signed)
Patient has been seeing people in her house. Family states there has not been anyone in my house. She has not been eating or drinking a lot per family. It is happening in the afternoon around 4-5 pm. She can't remember if she has been eating. Daughter is with patient.

## 2013-11-18 ENCOUNTER — Inpatient Hospital Stay (HOSPITAL_COMMUNITY)
Admission: EM | Admit: 2013-11-18 | Discharge: 2013-11-22 | DRG: 180 | Disposition: A | Discharge status: Skilled Nursing Facility | Payer: Medicare Other | Attending: Internal Medicine | Admitting: Internal Medicine

## 2013-11-18 ENCOUNTER — Emergency Department (HOSPITAL_COMMUNITY): Payer: Medicare Other

## 2013-11-18 ENCOUNTER — Encounter (HOSPITAL_COMMUNITY): Payer: Self-pay | Admitting: *Deleted

## 2013-11-18 DIAGNOSIS — J189 Pneumonia, unspecified organism: Secondary | ICD-10-CM | POA: Diagnosis present

## 2013-11-18 DIAGNOSIS — R630 Anorexia: Secondary | ICD-10-CM | POA: Diagnosis present

## 2013-11-18 DIAGNOSIS — J910 Malignant pleural effusion: Secondary | ICD-10-CM | POA: Diagnosis present

## 2013-11-18 DIAGNOSIS — D630 Anemia in neoplastic disease: Secondary | ICD-10-CM | POA: Diagnosis present

## 2013-11-18 DIAGNOSIS — C3490 Malignant neoplasm of unspecified part of unspecified bronchus or lung: Secondary | ICD-10-CM

## 2013-11-18 DIAGNOSIS — G8929 Other chronic pain: Secondary | ICD-10-CM | POA: Diagnosis present

## 2013-11-18 DIAGNOSIS — Z66 Do not resuscitate: Secondary | ICD-10-CM | POA: Diagnosis present

## 2013-11-18 DIAGNOSIS — Z881 Allergy status to other antibiotic agents status: Secondary | ICD-10-CM | POA: Diagnosis not present

## 2013-11-18 DIAGNOSIS — E039 Hypothyroidism, unspecified: Secondary | ICD-10-CM | POA: Diagnosis present

## 2013-11-18 DIAGNOSIS — J9600 Acute respiratory failure, unspecified whether with hypoxia or hypercapnia: Secondary | ICD-10-CM | POA: Diagnosis present

## 2013-11-18 DIAGNOSIS — K5900 Constipation, unspecified: Secondary | ICD-10-CM

## 2013-11-18 DIAGNOSIS — F0390 Unspecified dementia without behavioral disturbance: Secondary | ICD-10-CM | POA: Diagnosis present

## 2013-11-18 DIAGNOSIS — K219 Gastro-esophageal reflux disease without esophagitis: Secondary | ICD-10-CM | POA: Diagnosis present

## 2013-11-18 DIAGNOSIS — G9340 Encephalopathy, unspecified: Secondary | ICD-10-CM | POA: Diagnosis present

## 2013-11-18 DIAGNOSIS — M479 Spondylosis, unspecified: Secondary | ICD-10-CM | POA: Diagnosis present

## 2013-11-18 DIAGNOSIS — Z923 Personal history of irradiation: Secondary | ICD-10-CM | POA: Diagnosis not present

## 2013-11-18 DIAGNOSIS — R110 Nausea: Secondary | ICD-10-CM

## 2013-11-18 DIAGNOSIS — Z85118 Personal history of other malignant neoplasm of bronchus and lung: Secondary | ICD-10-CM | POA: Diagnosis not present

## 2013-11-18 DIAGNOSIS — Z880 Allergy status to penicillin: Secondary | ICD-10-CM | POA: Diagnosis not present

## 2013-11-18 DIAGNOSIS — M1710 Unilateral primary osteoarthritis, unspecified knee: Secondary | ICD-10-CM | POA: Diagnosis present

## 2013-11-18 DIAGNOSIS — M25569 Pain in unspecified knee: Secondary | ICD-10-CM | POA: Diagnosis present

## 2013-11-18 DIAGNOSIS — Z888 Allergy status to other drugs, medicaments and biological substances status: Secondary | ICD-10-CM | POA: Diagnosis not present

## 2013-11-18 DIAGNOSIS — C3410 Malignant neoplasm of upper lobe, unspecified bronchus or lung: Secondary | ICD-10-CM

## 2013-11-18 DIAGNOSIS — C342 Malignant neoplasm of middle lobe, bronchus or lung: Secondary | ICD-10-CM | POA: Diagnosis present

## 2013-11-18 DIAGNOSIS — Z515 Encounter for palliative care: Secondary | ICD-10-CM

## 2013-11-18 DIAGNOSIS — R000 Tachycardia, unspecified: Secondary | ICD-10-CM

## 2013-11-18 DIAGNOSIS — Z87891 Personal history of nicotine dependence: Secondary | ICD-10-CM | POA: Diagnosis not present

## 2013-11-18 DIAGNOSIS — E785 Hyperlipidemia, unspecified: Secondary | ICD-10-CM | POA: Diagnosis present

## 2013-11-18 DIAGNOSIS — I10 Essential (primary) hypertension: Secondary | ICD-10-CM | POA: Diagnosis present

## 2013-11-18 DIAGNOSIS — J9601 Acute respiratory failure with hypoxia: Secondary | ICD-10-CM | POA: Diagnosis present

## 2013-11-18 DIAGNOSIS — M2550 Pain in unspecified joint: Secondary | ICD-10-CM

## 2013-11-18 DIAGNOSIS — R4182 Altered mental status, unspecified: Secondary | ICD-10-CM | POA: Diagnosis present

## 2013-11-18 DIAGNOSIS — E119 Type 2 diabetes mellitus without complications: Secondary | ICD-10-CM | POA: Diagnosis present

## 2013-11-18 DIAGNOSIS — J90 Pleural effusion, not elsewhere classified: Secondary | ICD-10-CM

## 2013-11-18 DIAGNOSIS — Z833 Family history of diabetes mellitus: Secondary | ICD-10-CM | POA: Diagnosis not present

## 2013-11-18 HISTORY — DX: Pain in unspecified shoulder: M25.519

## 2013-11-18 HISTORY — DX: Bursitis of unspecified shoulder: M75.50

## 2013-11-18 HISTORY — DX: Colles' fracture of unspecified radius, initial encounter for closed fracture: S52.539A

## 2013-11-18 HISTORY — DX: Unspecified disorder of synovium and tendon, unspecified shoulder: M67.919

## 2013-11-18 HISTORY — DX: Bursopathy, unspecified: M71.9

## 2013-11-18 HISTORY — DX: Benign paroxysmal vertigo, unspecified ear: H81.10

## 2013-11-18 HISTORY — DX: Pain in unspecified joint: M25.50

## 2013-11-18 HISTORY — DX: Pleural effusion, not elsewhere classified: J90

## 2013-11-18 HISTORY — DX: Pneumonia, unspecified organism: J18.9

## 2013-11-18 HISTORY — DX: Malignant neoplasm of upper lobe, unspecified bronchus or lung: C34.10

## 2013-11-18 HISTORY — DX: Anemia, unspecified: D64.9

## 2013-11-18 LAB — COMPREHENSIVE METABOLIC PANEL
ALT: 12 U/L (ref 0–35)
AST: 20 U/L (ref 0–37)
Albumin: 3.7 g/dL (ref 3.5–5.2)
Alkaline Phosphatase: 121 U/L — ABNORMAL HIGH (ref 39–117)
Anion gap: 11 (ref 5–15)
BUN: 13 mg/dL (ref 6–23)
CALCIUM: 9.6 mg/dL (ref 8.4–10.5)
CO2: 37 meq/L — AB (ref 19–32)
Chloride: 86 mEq/L — ABNORMAL LOW (ref 96–112)
Creatinine, Ser: 0.58 mg/dL (ref 0.50–1.10)
GFR, EST NON AFRICAN AMERICAN: 84 mL/min — AB (ref >90)
Glucose, Bld: 132 mg/dL — ABNORMAL HIGH (ref 70–99)
Potassium: 4.3 mEq/L (ref 3.7–5.3)
Sodium: 134 mEq/L — ABNORMAL LOW (ref 137–147)
Total Bilirubin: 0.3 mg/dL (ref 0.3–1.2)
Total Protein: 7.9 g/dL (ref 6.0–8.3)

## 2013-11-18 LAB — TROPONIN I: Troponin I: <0.3 ng/mL (ref <0.30)

## 2013-11-18 LAB — BLOOD GAS, ARTERIAL
ACID-BASE EXCESS: 11.7 mmol/L — AB (ref 0.0–2.0)
Bicarbonate: 36.9 mEq/L — ABNORMAL HIGH (ref 20.0–24.0)
Drawn by: 105551
O2 Content: 2.5 L/min
O2 SAT: 96.9 %
PO2 ART: 95 mmHg (ref 80.0–100.0)
Patient temperature: 37
TCO2: 33.2 mmol/L (ref 0–100)
pCO2 arterial: 59.4 mmHg (ref 35.0–45.0)
pH, Arterial: 7.409 (ref 7.350–7.450)

## 2013-11-18 LAB — URINALYSIS, ROUTINE W REFLEX MICROSCOPIC
BILIRUBIN URINE: NEGATIVE
Glucose, UA: NEGATIVE mg/dL
Hgb urine dipstick: NEGATIVE
KETONES UR: NEGATIVE mg/dL
Leukocytes, UA: NEGATIVE
NITRITE: NEGATIVE
Protein, ur: NEGATIVE mg/dL
Specific Gravity, Urine: 1.01 (ref 1.005–1.030)
UROBILINOGEN UA: 0.2 mg/dL (ref 0.0–1.0)
pH: 8 (ref 5.0–8.0)

## 2013-11-18 LAB — CBC WITH DIFFERENTIAL/PLATELET
Basophils Absolute: 0 10*3/uL (ref 0.0–0.1)
Basophils Relative: 0 % (ref 0–1)
EOS PCT: 2 % (ref 0–5)
Eosinophils Absolute: 0.1 10*3/uL (ref 0.0–0.7)
HEMATOCRIT: 33.8 % — AB (ref 36.0–46.0)
Hemoglobin: 11 g/dL — ABNORMAL LOW (ref 12.0–15.0)
LYMPHS PCT: 13 % (ref 12–46)
Lymphs Abs: 0.9 10*3/uL (ref 0.7–4.0)
MCH: 29 pg (ref 26.0–34.0)
MCHC: 32.5 g/dL (ref 30.0–36.0)
MCV: 89.2 fL (ref 78.0–100.0)
MONO ABS: 0.6 10*3/uL (ref 0.1–1.0)
Monocytes Relative: 9 % (ref 3–12)
Neutro Abs: 5.1 10*3/uL (ref 1.7–7.7)
Neutrophils Relative %: 76 % (ref 43–77)
Platelets: 393 10*3/uL (ref 150–400)
RBC: 3.79 MIL/uL — ABNORMAL LOW (ref 3.87–5.11)
RDW: 12.9 % (ref 11.5–15.5)
WBC: 6.7 10*3/uL (ref 4.0–10.5)

## 2013-11-18 LAB — GLUCOSE, CAPILLARY
Glucose-Capillary: 152 mg/dL — ABNORMAL HIGH (ref 70–99)
Glucose-Capillary: 160 mg/dL — ABNORMAL HIGH (ref 70–99)
Glucose-Capillary: 157 mg/dL — ABNORMAL HIGH (ref 70–99)

## 2013-11-18 LAB — MRSA PCR SCREENING: MRSA by PCR: POSITIVE — AB

## 2013-11-18 LAB — PRO B NATRIURETIC PEPTIDE: PRO B NATRI PEPTIDE: 289.1 pg/mL (ref 0–450)

## 2013-11-18 LAB — PROTIME-INR
INR: 1.11 (ref 0.00–1.49)
Prothrombin Time: 14.3 seconds (ref 11.6–15.2)

## 2013-11-18 LAB — I-STAT CG4 LACTIC ACID, ED: Lactic Acid, Venous: 1.09 mmol/L (ref 0.5–2.2)

## 2013-11-18 MED ORDER — SODIUM CHLORIDE 0.9 % IJ SOLN
3.0000 mL | Freq: Two times a day (BID) | INTRAMUSCULAR | Status: DC
  Administered 2013-11-18 – 2013-11-22 (×5): 3 mL via INTRAVENOUS

## 2013-11-18 MED ORDER — BISACODYL 5 MG PO TBEC
5.0000 mg | DELAYED_RELEASE_TABLET | Freq: Two times a day (BID) | ORAL | Status: DC | PRN
  Administered 2013-11-18: 5 mg via ORAL
  Filled 2013-11-18: qty 1

## 2013-11-18 MED ORDER — ONDANSETRON HCL 4 MG/2ML IJ SOLN
4.0000 mg | Freq: Four times a day (QID) | INTRAMUSCULAR | Status: DC | PRN
  Administered 2013-11-18: 4 mg via INTRAVENOUS
  Filled 2013-11-18: qty 2

## 2013-11-18 MED ORDER — EZETIMIBE 10 MG PO TABS
5.0000 mg | ORAL_TABLET | Freq: Every day | ORAL | Status: DC
  Administered 2013-11-18 – 2013-11-20 (×3): 5 mg via ORAL
  Administered 2013-11-21: 11:00:00 via ORAL
  Filled 2013-11-18 (×5): qty 0.5

## 2013-11-18 MED ORDER — SODIUM CHLORIDE 0.9 % IJ SOLN: 3.0000 mL | INTRAMUSCULAR | Status: DC | PRN

## 2013-11-18 MED ORDER — ONDANSETRON HCL 4 MG PO TABS
4.0000 mg | ORAL_TABLET | Freq: Four times a day (QID) | ORAL | Status: DC | PRN
  Administered 2013-11-18 – 2013-11-20 (×3): 4 mg via ORAL
  Filled 2013-11-18 (×4): qty 1

## 2013-11-18 MED ORDER — SODIUM CHLORIDE 0.9 % IJ SOLN
3.0000 mL | Freq: Two times a day (BID) | INTRAMUSCULAR | Status: DC
  Administered 2013-11-18 – 2013-11-21 (×6): 3 mL via INTRAVENOUS

## 2013-11-18 MED ORDER — ATORVASTATIN CALCIUM 10 MG PO TABS
10.0000 mg | ORAL_TABLET | Freq: Every day | ORAL | Status: DC
  Administered 2013-11-18 – 2013-11-20 (×3): 10 mg via ORAL
  Filled 2013-11-18 (×5): qty 1

## 2013-11-18 MED ORDER — LEVOTHYROXINE SODIUM 125 MCG PO TABS
125.0000 ug | ORAL_TABLET | Freq: Every day | ORAL | Status: DC
  Administered 2013-11-19 – 2013-11-22 (×4): 125 ug via ORAL
  Filled 2013-11-18 (×6): qty 1

## 2013-11-18 MED ORDER — ACETAMINOPHEN 325 MG PO TABS
650.0000 mg | ORAL_TABLET | Freq: Four times a day (QID) | ORAL | Status: DC | PRN
  Administered 2013-11-20: 650 mg via ORAL
  Filled 2013-11-18: qty 2

## 2013-11-18 MED ORDER — MIRTAZAPINE 15 MG PO TABS
15.0000 mg | ORAL_TABLET | Freq: Every day | ORAL | Status: DC
  Administered 2013-11-18 – 2013-11-21 (×4): 15 mg via ORAL
  Filled 2013-11-18 (×6): qty 1

## 2013-11-18 MED ORDER — PANTOPRAZOLE SODIUM 40 MG PO TBEC
40.0000 mg | DELAYED_RELEASE_TABLET | Freq: Every day | ORAL | Status: DC
Start: 2013-11-18 — End: 2013-11-19
  Administered 2013-11-18: 40 mg via ORAL
  Filled 2013-11-18: qty 1

## 2013-11-18 MED ORDER — NAPROXEN 375 MG PO TABS
375.0000 mg | ORAL_TABLET | Freq: Two times a day (BID) | ORAL | Status: DC | PRN
  Administered 2013-11-18 – 2013-11-20 (×2): 375 mg via ORAL
  Filled 2013-11-18 (×2): qty 1

## 2013-11-18 MED ORDER — SODIUM CHLORIDE 0.9 % IV SOLN: 250.0000 mL | INTRAVENOUS | Status: DC | PRN

## 2013-11-18 MED ORDER — DIAZEPAM 2 MG PO TABS
2.0000 mg | ORAL_TABLET | Freq: Every evening | ORAL | Status: DC | PRN
  Administered 2013-11-18 – 2013-11-19 (×2): 2 mg via ORAL
  Filled 2013-11-18 (×2): qty 1

## 2013-11-18 MED ORDER — DONEPEZIL HCL 5 MG PO TABS
5.0000 mg | ORAL_TABLET | Freq: Every day | ORAL | Status: DC
  Administered 2013-11-18 – 2013-11-21 (×4): 5 mg via ORAL
  Filled 2013-11-18 (×5): qty 1

## 2013-11-18 NOTE — ED Notes (Signed)
Report given to [facility] RN

## 2013-11-18 NOTE — H&P (Signed)
PCP:   Leonides Grills, MD   Chief Complaint:  ams  HPI: 80 yo female with h/o lung cancer dx last year, mild dementia lives alone comes in with daughter having noted to be more confused than normal.  Pt denies any pain, no sob, no cough.  No swelling in her legs.  Appetite is good and no wt loss.  She completed her xrt for her lung cancer, but only tolerated one dose of chemo and has not been on anything in over 6 months.  dtr reports to be that "her cancer is cured", and that is what family has been telling the patient for over 6 months.  On arrival to ED, pt was hypoxic in the 70s.  Has no oxygen requirements at home.    Review of Systems:  Positive and negative as per HPI otherwise all other systems are negative  Past Medical History: Past Medical History  Diagnosis Date  . HTN (hypertension)   . Hyperlipidemia   . Thyroid disease   . Diabetes mellitus   . Anxiety   . History of stress test 11/2012    [facility]   . Hypothyroidism   . Dementia     pt. states daughter got her started on Aricept   . GERD (gastroesophageal reflux disease)   . Arthritis     knees, legs, back  . Bladder incontinence     wears pad always   . Lung cancer   . History of radiation therapy 01/08/13-04/12/13    70Gy total  . Hx of radiation therapy    Past Surgical History  Procedure Laterality Date  . Arm fracture surgery    . Tonsillectomy    . Appendectomy    . Abdominal hysterectomy    . Lipoma removal    . Eye surgery    . Cholecystectomy    . Cataract extraction    . Fracture surgery      plate in R hand - long time ago  . Carpal tunnel release      R arm  . Video bronchoscopy N/A 12/15/2012    Procedure: VIDEO BRONCHOSCOPY;  Surgeon: Melrose Nakayama, MD;  Location: [facility] OR;  Service: Thoracic;  Laterality: N/A;    Medications: Prior to Admission medications   Medication Sig Start Date End Date Taking? Authorizing Provider  acetaminophen  (TYLENOL) 325 MG tablet Take 2 tablets (650 mg total) by mouth every 6 (six) hours as needed for fever. 01/22/13   Erline Hau, MD  Cholecalciferol (VITAMIN D) 2000 UNITS tablet Take 2,000 Units by mouth daily.    Historical Provider, MD  donepezil (ARICEPT) 5 MG tablet Take 5 mg by mouth at bedtime.     Historical Provider, MD  esomeprazole (NEXIUM) 40 MG capsule Take 40 mg by mouth daily before breakfast.      Historical Provider, MD  ezetimibe (ZETIA) 10 MG tablet Take 5 mg by mouth.     Historical Provider, MD  ferrous sulfate 325 (65 FE) MG tablet Take 325 mg by mouth 2 (two) times daily.    Historical Provider, MD  fluticasone (FLONASE) 50 MCG/ACT nasal spray Place 2 sprays into the nose daily as needed for rhinitis.     Historical Provider, MD  guaiFENesin (MUCINEX) 600 MG 12 hr tablet Take 2 tablets (1,200 mg total) by mouth 2 (two) times daily. 01/22/13   Erline Hau, MD  levothyroxine (SYNTHROID, LEVOTHROID) 88 MCG tablet  125 mcg daily before breakfast.     Historical Provider, MD  magnesium oxide (MAG-OX) 400 (241.3 MG) MG tablet Take 1 tablet (400 mg total) by mouth 2 (two) times daily. For 7 days 01/22/13   Erline Hau, MD  Multiple Vitamin (MULTIVITAMIN) tablet Take 1 tablet by mouth daily.    Historical Provider, MD  nystatin-triamcinolone ointment (MYCOLOG) Apply 1 application topically 2 (two) times daily. To affected area 03/26/13   Lora Paula, MD  rosuvastatin (CRESTOR) 5 MG tablet Take 5 mg by mouth at bedtime.     Historical Provider, MD  vitamin C (ASCORBIC ACID) 500 MG tablet Take 500 mg by mouth daily.    Historical Provider, MD    Allergies:   Allergies  Allergen Reactions  . Bactrim [Sulfamethoxazole-Trimethoprim] Hives  . Ciprofloxacin Hives  . Penicillins Swelling    Per MD, not throat swelling  . Welchol [Colesevelam Hcl] Other (See Comments)    Body aches  . Zocor [Simvastatin] Other (See Comments)    Muscle  aches    Social History:  reports that she has quit smoking. Her smoking use included Cigarettes. She started smoking about 28 years ago. She has a 30 pack-year smoking history. She has never used smokeless tobacco. She reports that she does not drink alcohol or use illicit drugs.  Family History: Family History  Problem Relation Age of Onset  . Heart disease    . Arthritis    . Cancer    . Diabetes      Physical Exam: Filed Vitals:   11/17/13 2155 11/18/13 0131  BP: 117/77 122/76  Pulse: 121 114  Temp: 98.5 F (36.9 C)   TempSrc: Oral   Resp: 18 24  Height: 5\' 4"  (1.626 m)   Weight: 68.493 kg (151 lb)   SpO2: 91% 98%   General appearance: alert, cooperative and no distress Head: Normocephalic, without obvious abnormality, atraumatic Eyes: negative Nose: Nares normal. Septum midline. Mucosa normal. No drainage or sinus tenderness. Neck: no JVD and supple, symmetrical, trachea midline Lungs: diminished breath sounds RLL, RML and RUL Heart: regular rate and rhythm, S1, S2 normal, no murmur, click, rub or gallop Abdomen: soft, non-tender; bowel sounds normal; no masses,  no organomegaly Extremities: extremities normal, atraumatic, no cyanosis or edema Pulses: 2+ and symmetric Skin: Skin color, texture, turgor normal. No rashes or lesions Neurologic: Grossly normal    Labs on Admission:   Recent Labs  11/18/13 0045  NA 134*  K 4.3  CL 86*  CO2 37*  GLUCOSE 132*  BUN 13  CREATININE 0.58  CALCIUM 9.6    Recent Labs  11/18/13 0045  AST 20  ALT 12  ALKPHOS 121*  BILITOT 0.3  PROT 7.9  ALBUMIN 3.7    Recent Labs  11/18/13 0045  WBC 6.7  NEUTROABS 5.1  HGB 11.0*  HCT 33.8*  MCV 89.2  PLT 393    Recent Labs  11/18/13 0045  TROPONINI <0.30   Radiological Exams on Admission: Dg Chest Port 1 View  11/18/2013   CLINICAL DATA:  80 year old female with cough congestion shortness of breath and right lung cancer. Initial encounter.  EXAM: PORTABLE  CHEST - 1 VIEW  COMPARISON:  08/29/2013 chest CT and earlier.  FINDINGS: Portable AP upright view at 0033 hrs. There is new subtotal opacification of the right hemi thorax, which most resembles a large pleural effusion. This obscures the underlying known right lung mass. The left lung appears stable. Visualized tracheal air  column is within normal limits.  IMPRESSION: New large right pleural effusion, obscuring the known right lung mass.   Electronically Signed   By: Lars Pinks M.D.   On: 11/18/2013 01:19    Assessment/Plan  80 yo female with large right sided pleural effusion likely from extension of lung mass/cancer  Principal Problem:   Acute respiratory failure with hypoxia due to pleural effusion-  Transfer to [place] for thoracentesis in am, which pt agrees with.  Did not go into full detail that this is likely her cancer with the pt per her daughters request.  But patient with very mild dementia, and is aware of what is going on.  Spoke to daughter alone, dtr understands that there will probably be further discussions about whether to tell her mom what is going on or not.    Active Problems:   Malignant neoplasm of upper lobe, bronchus or lung   Anemia in neoplastic disease   Encephalopathy acute   Pleural effusion on right  Pt is DNR, discussion took place with daughter and her mom.  Pt is clear she does not want extreme measures taken such as cpr or intubation ever in the future, dtr has been aware of these wishes.  Pt agrees however to thorocentesis if needed.  DAVID,RACHAL A 11/18/2013, 3:44 AM

## 2013-11-18 NOTE — Consult Note (Signed)
Patient [name]      DOB: 1929/12/02      AVW:[id number]     Consult Note from the Palliative Medicine Team at [place] Requested by: Dr Eliseo Squires     PCP: Leonides Grills, MD Reason for Consultation: Goals of Care     Phone Number:[phone]  Assessment/Recommendations:   1.  Code Status: DNR  2. Goals of Care: Long discussion this morning with Peggy Espinoza.  She admits that she has been trying to hide information about her mom's cancer.  Peggy Espinoza and her mom have been present for conversations with Dr Peggy Espinoza, but felt mom has not grasped this well.  She worries about mom becoming too worried, depressed with negative information. We discussed her pleural effusion most likely being cancer at this point and that if this is the case, she will likely have decline over coming months and it would be hard to have those cahnges and not know what is going on.  Peggy Espinoza agreed that we could at least ask Peggy Espinoza if she wanted to know this information and tell her about concerns going forward.  Peggy Espinoza states that her mom would not want any further cancer directed therapy. Peggy Espinoza and I talked with Peggy Espinoza today. Peggy Espinoza already knows about the pleural effusion and I asked if she wanted to know more about what we are concerned about or tell her daughter. We discussed concern that this may be her cancer causing this and what that might mean.  She wants to wait for fluid analysis to come back and talk about it further when we know more information.  In talking with Peggy Espinoza about these ongoing discussions likely needing to occur even after she leaves hospital. Once plan is in place for how to manage recurrent pleural fluid, i certainly think she would be hospice candidate. Daughter hopes to get Peggy Espinoza into nursing facility as she worries about safety at home. May be difficult to engage hospice services in nursing faciltiy (insurance coverage) and palliative care services would be good option in this scenario.     3.  Symptom Management:   1. Pleural Effusion-Suspect that this is likely malignant effusion given her history. Agree with thoracentesis and sending labs. MPE usually relates to prognosis of less than 6 months (especially if not pursuing cancer directed therapy).  High risk to reccur in next 30 days if malignant and she may need pleurex cath down the road if re-accumulates rapidly.  Will likely need to be followed as outpatient. There is a chance for auto-pleurodesis with pleurex (though less than with talc).  Other option would be Talc pleurodesis, but I am not sure how well she would tolerate this with her age and daughters stated desire to focus on quality of life over quantity.   2. Anorexia- start mirtazapine which should also help with nausea 3. Chronic Knee Pain- gets routine steroid injections. Will add PRN tylenol 4. Nausea- mirtazapine. PRN zofran.   4. Psychosocial/Spiritual:Lives at home alone in [place]. Only living child is her daughter who lives in [place]. She is well supported by family. Strong faith background through entire family.     Brief HPI: Patient is an 80 yo female with PMHx of HTN, DM, HLD, mild dementia vs cognitive impairment, NSCLC (SCC) dx in 11/2012.  She underwent initial chemo/xrt for her NSCLC.  She became very ill after initial chemo/xrt (ICU stay w/post-obs PNA) and after improvement decided only to complete radiation treatment and not XRT. She had repeat  CT scan in June showing progressive disease. After fu with Dr Peggy Espinoza of radiation oncology, they continued to decline medical oncology intervention. She was brought into ED with some confusion of unclear duration.   I met initially with Peggy Espinoza and her granddaughter today. States she is feeling fine and wasn't really sure why she needed to come to hospital.  With some prompting she remembers that there is fluid in her lung that Dr's are planning to drain tomorrow. She reports that her appetite has been good and not  losing weight (granddaughter questions this). Admits that she has some occaisonal nausea which effects her appetite and maybe she doesn't eat much all the time. Not having any pain and doesn't feel SOB at the moment.  States she ambulates fine and lives at home alone.  Denies any F/C, cough, constipation, urinary troubles.   Has some chronic knee pain for which she receives joint injections. Not particularly bothersome today.    Spoke with Daughter Peggy Espinoza extensively today as well. She reports that mom has had poor appetite and home anti-emetic makes her constipated.    ROS: Full ROS negative unless otherwise mentioned above.     PMH:  Past Medical History  Diagnosis Date  . HTN (hypertension)   . Hyperlipidemia   . Thyroid disease   . Diabetes mellitus   . Anxiety   . History of stress test 11/2012    [facility]   . Hypothyroidism   . Dementia     pt. states daughter got her started on Aricept   . GERD (gastroesophageal reflux disease)   . Arthritis     knees, legs, back  . Bladder incontinence     wears pad always   . Lung cancer   . History of radiation therapy 01/08/13-04/12/13    70Gy total  . Hx of radiation therapy      PSH: Past Surgical History  Procedure Laterality Date  . Arm fracture surgery    . Tonsillectomy    . Appendectomy    . Abdominal hysterectomy    . Lipoma removal    . Eye surgery    . Cholecystectomy    . Cataract extraction    . Fracture surgery      plate in R hand - long time ago  . Carpal tunnel release      R arm  . Video bronchoscopy N/A 12/15/2012    Procedure: VIDEO BRONCHOSCOPY;  Surgeon: Melrose Nakayama, MD;  Location: [facility];  Service: Thoracic;  Laterality: N/A;   I have reviewed the [place] and SH and  If appropriate update it with new information. Allergies  Allergen Reactions  . Bactrim [Sulfamethoxazole-Trimethoprim] Hives  . Ciprofloxacin Hives  . Penicillins Swelling    Per MD, not throat  swelling  . Welchol [Colesevelam Hcl] Other (See Comments)    Body aches  . Zocor [Simvastatin] Other (See Comments)    Muscle aches   Scheduled Meds: . atorvastatin  10 mg Oral q1800  . donepezil  5 mg Oral QHS  . ezetimibe  5 mg Oral Daily  . [START ON 11/19/2013] levothyroxine  125 mcg Oral QAC breakfast  . pantoprazole  40 mg Oral Daily  . sodium chloride  3 mL Intravenous Q12H  . sodium chloride  3 mL Intravenous Q12H   Continuous Infusions:  PRN Meds:.sodium chloride, ondansetron (ZOFRAN) IV, ondansetron, sodium chloride    BP 120/77  Pulse 117  Temp(Src) 97.4  F (36.3 C) (Oral)  Resp 21  Ht $R'5\' 4"'TA$  (1.626 m)  Wt 69.31 kg (152 lb 12.8 oz)  BMI 26.22 kg/m2  SpO2 99%   PPS: 70   Intake/Output Summary (Last 24 hours) at 11/18/13 1107 Last data filed at 11/18/13 0600  Gross per 24 hour  Intake      0 ml  Output    400 ml  Net   -400 ml    Physical Exam:  General: Alert, NAD, on O2 via [place], mild confusion vs cognitive impairment HEENT:  [place], sclera anicteric Neck: supple Chest:   RRR CVS: decreased breath sounds on right Abdomen: soft, NT, ND Ext:no lower ext edema Neuro: oriented x3, moves all extr Skin: warm/dry Lymph: No palpable cervical adenopathy  Labs: CBC    Component Value Date/Time   WBC 6.7 11/18/2013 0045   WBC 7.1 01/08/2013 1059   RBC 3.79* 11/18/2013 0045   RBC 3.79 01/08/2013 1059   HGB 11.0* 11/18/2013 0045   HGB 10.7* 01/08/2013 1059   HCT 33.8* 11/18/2013 0045   HCT 33.2* 01/08/2013 1059   PLT 393 11/18/2013 0045   PLT 347 01/08/2013 1059   MCV 89.2 11/18/2013 0045   MCV 87.6 01/08/2013 1059   MCH 29.0 11/18/2013 0045   MCH 28.2 01/08/2013 1059   MCHC 32.5 11/18/2013 0045   MCHC 32.2 01/08/2013 1059   RDW 12.9 11/18/2013 0045   RDW 13.6 01/08/2013 1059   LYMPHSABS 0.9 11/18/2013 0045   LYMPHSABS 2.1 01/08/2013 1059   MONOABS 0.6 11/18/2013 0045   MONOABS 0.5 01/08/2013 1059   EOSABS 0.1 11/18/2013 0045   EOSABS 0.1 01/08/2013 1059    BASOSABS 0.0 11/18/2013 0045   BASOSABS 0.0 01/08/2013 1059    BMET    Component Value Date/Time   NA 134* 11/18/2013 0045   NA 140 01/08/2013 1100   K 4.3 11/18/2013 0045   K 4.9 01/08/2013 1100   CL 86* 11/18/2013 0045   CO2 37* 11/18/2013 0045   CO2 31* 01/08/2013 1100   GLUCOSE 132* 11/18/2013 0045   GLUCOSE 131 01/08/2013 1100   BUN 13 11/18/2013 0045   BUN 13.2 01/08/2013 1100   CREATININE 0.58 11/18/2013 0045   CREATININE 0.7 01/08/2013 1100   CALCIUM 9.6 11/18/2013 0045   CALCIUM 10.1 01/08/2013 1100   GFRNONAA 84* 11/18/2013 0045   GFRAA >90 11/18/2013 0045    CMP     Component Value Date/Time   NA 134* 11/18/2013 0045   NA 140 01/08/2013 1100   K 4.3 11/18/2013 0045   K 4.9 01/08/2013 1100   CL 86* 11/18/2013 0045   CO2 37* 11/18/2013 0045   CO2 31* 01/08/2013 1100   GLUCOSE 132* 11/18/2013 0045   GLUCOSE 131 01/08/2013 1100   BUN 13 11/18/2013 0045   BUN 13.2 01/08/2013 1100   CREATININE 0.58 11/18/2013 0045   CREATININE 0.7 01/08/2013 1100   CALCIUM 9.6 11/18/2013 0045   CALCIUM 10.1 01/08/2013 1100   PROT 7.9 11/18/2013 0045   PROT 7.8 01/08/2013 1100   ALBUMIN 3.7 11/18/2013 0045   ALBUMIN 3.3* 01/08/2013 1100   AST 20 11/18/2013 0045   AST 16 01/08/2013 1100   ALT 12 11/18/2013 0045   ALT 11 01/08/2013 1100   ALKPHOS 121* 11/18/2013 0045   ALKPHOS 120 01/08/2013 1100   BILITOT 0.3 11/18/2013 0045   BILITOT 0.29 01/08/2013 1100   GFRNONAA 84* 11/18/2013 0045   GFRAA >90 11/18/2013 0045    Greater than 50%  of this time was spent counseling and coordinating care related to the above assessment and plan.  11/18/13 CXR IMPRESSION:  New large right pleural effusion, obscuring the known right lung  mass.   08/29/13 CT Chest IMPRESSION:  Interval increase in patchy and nodular airspace disease around the  known right lung mass. This makes comparative measurement on lung  windows today (the lesion was measured using lung windows  previously) unreliable. As such, the  lesion has been measured today  and remeasured previously using soft tissue windows. The mass has  progressed in the interval measuring 2.8 x 2.2 cm today compared to  2.2 x 1.8 cm previously.  Increase in airspace disease around lesion may be related to tumor  spread or postobstructive changes.  Index 8 mm AP window lymph node measured on the previous study is  now 10 mm.  Slight increase in size of the small right pleural effusion.    Doran Clay D.O. Palliative Medicine Team at [facility]  Pager: [phone] Team Phone: [phone]

## 2013-11-18 NOTE — Progress Notes (Signed)
PROGRESS NOTE  Peggy Espinoza QVZ:[id number] DOB: July 03, 1929 DOA: 11/18/2013 PCP: Leonides Grills, MD  Assessment/Plan: Acute respiratory failure with hypoxia due to pleural effusion-  -thoracentesis-labs ordered  -patient with very mild dementia, and is aware of what is going on.   Malignant neoplasm of upper lobe, bronchus or lung: last follow up with Dr. Lisbeth Renshaw: CT scan of the chest. This showed some improvement in the size of the right sided lung mass. Stable mediastinal lymph node present. I discussed this with her. I discussed once again my recommendation for the patient to be seen by medical oncology. However, the patient's daughter indicated again that the patient did not wish to do so at this time. We therefore discussed continued followup which will include a CT scan of the chest in 3 months. She indicated that the patient understands that she may need to see medical oncology at some point with progression of her stage III non-small cell lung cancer. Spoke at length with daughter- does not want oncology consult as patient did not tolerated chemo- would be open to goals of care with palliative care- per daughter mom thinks her cancer is cured.    Anemia in neoplastic disease   Pleural effusion on right   Code Status: DNR Family Communication: daughter Disposition Plan:    Consultants:  IR  Palliative care  Procedures:  HPI/Subjective: No SOB, no CP "just tired"   Objective: Filed Vitals:   11/18/13 0531  BP: 120/77  Pulse: 117  Temp: 97.4 F (36.3 C)  Resp: 21    Intake/Output Summary (Last 24 hours) at 11/18/13 0804 Last data filed at 11/18/13 0600  Gross per 24 hour  Intake      0 ml  Output    400 ml  Net   -400 ml   Filed Weights   11/17/13 2155 11/18/13 0531  Weight: 68.493 kg (151 lb) 69.31 kg (152 lb 12.8 oz)    Exam:   General:  Elderly, NAD  Cardiovascular: tachy  Respiratory: diminished breath sounds  Abdomen: +BS,  soft  Musculoskeletal: moves all 4 ext   Data Reviewed: Basic Metabolic Panel:  Recent Labs Lab 11/18/13 0045  NA 134*  K 4.3  CL 86*  CO2 37*  GLUCOSE 132*  BUN 13  CREATININE 0.58  CALCIUM 9.6   Liver Function Tests:  Recent Labs Lab 11/18/13 0045  AST 20  ALT 12  ALKPHOS 121*  BILITOT 0.3  PROT 7.9  ALBUMIN 3.7   No results found for this basename: LIPASE, AMYLASE,  in the last 168 hours No results found for this basename: AMMONIA,  in the last 168 hours CBC:  Recent Labs Lab 11/18/13 0045  WBC 6.7  NEUTROABS 5.1  HGB 11.0*  HCT 33.8*  MCV 89.2  PLT 393   Cardiac Enzymes:  Recent Labs Lab 11/18/13 0045  TROPONINI <0.30   BNP (last 3 results)  Recent Labs  11/18/13 0045  PROBNP 289.1   CBG:  Recent Labs Lab 11/18/13 0545  GLUCAP 152*    Recent Results (from the past 240 hour(s))  MRSA PCR SCREENING     Status: Abnormal   Collection Time    11/18/13  5:50 AM      Result Value Ref Range Status   MRSA by PCR POSITIVE (*) NEGATIVE Final   Comment:            The GeneXpert MRSA Assay (FDA     approved for NASAL specimens  only), is one component of a     comprehensive MRSA colonization     surveillance program. It is not     intended to diagnose MRSA     infection nor to guide or     monitor treatment for     MRSA infections.     RESULT CALLED TO, READ BACK BY AND VERIFIED WITH:     WASHINGTON,T RN 1610 11/18/13 MITCHELL,L     Studies: Dg Chest Port 1 View  11/18/2013   CLINICAL DATA:  80 year old female with cough congestion shortness of breath and right lung cancer. Initial encounter.  EXAM: PORTABLE CHEST - 1 VIEW  COMPARISON:  08/29/2013 chest CT and earlier.  FINDINGS: Portable AP upright view at 0033 hrs. There is new subtotal opacification of the right hemi thorax, which most resembles a large pleural effusion. This obscures the underlying known right lung mass. The left lung appears stable. Visualized tracheal air column  is within normal limits.  IMPRESSION: New large right pleural effusion, obscuring the known right lung mass.   Electronically Signed   By: Lars Pinks M.D.   On: 11/18/2013 01:19    Scheduled Meds: . sodium chloride  3 mL Intravenous Q12H  . sodium chloride  3 mL Intravenous Q12H   Continuous Infusions:  Antibiotics Given (last 72 hours)   None      Principal Problem:   Acute respiratory failure with hypoxia Active Problems:   Malignant neoplasm of upper lobe, bronchus or lung   Anemia in neoplastic disease   Encephalopathy acute   Pleural effusion on right   Pleural effusion, right    Time spent: 35 min    [name], [place] [facility] Pager [phone]. If 7PM-7AM, please contact night-coverage at [email or web address], password [facility] 11/18/2013, 8:04 AM  LOS: 0 days

## 2013-11-18 NOTE — ED Provider Notes (Signed)
CSN: [id number]     Arrival date & time 11/17/13  2140 History   First MD Initiated Contact with Patient 11/18/13 0015    This chart was scribed for Phillips Grout, MD by Terressa Koyanagi, ED Scribe. This patient was seen in room APA06/APA06 and the patient's care was started at 12:21 AM.  Chief Complaint  Patient presents with  . Altered Mental Status   PCP: Leonides Grills, MD  HPI HPI Comments: Peggy Espinoza is a 80 y.o. female, with medical Hx noted below and significant for lung cancer (pt is currently undergoing chemo for lung cancer), who presents to the Emergency Department complaining of altered mental status. Pt's daughter reports that pt becomes confused every afternoon. Pt denies any present pain, nausea, vomiting. During exam pt's O2 77% on room air and improves to 95% at 3L of O2 per min. Pt states that she is not on any O2 at home.   Past Medical History  Diagnosis Date  . HTN (hypertension)   . Hyperlipidemia   . Thyroid disease   . Diabetes mellitus   . Anxiety   . History of stress test 11/2012    [facility]   . Hypothyroidism   . Dementia     pt. states daughter got her started on Aricept   . GERD (gastroesophageal reflux disease)   . Arthritis     knees, legs, back  . Bladder incontinence     wears pad always   . Lung cancer   . History of radiation therapy 01/08/13-04/12/13    70Gy total  . Hx of radiation therapy    Past Surgical History  Procedure Laterality Date  . Arm fracture surgery    . Tonsillectomy    . Appendectomy    . Abdominal hysterectomy    . Lipoma removal    . Eye surgery    . Cholecystectomy    . Cataract extraction    . Fracture surgery      plate in R hand - long time ago  . Carpal tunnel release      R arm  . Video bronchoscopy N/A 12/15/2012    Procedure: VIDEO BRONCHOSCOPY;  Surgeon: Melrose Nakayama, MD;  [facility]: [facility] OR;  Service: Thoracic;  Laterality: N/A;   Family History  Problem  Relation Age of Onset  . Heart disease    . Arthritis    . Cancer    . Diabetes     History  Substance Use Topics  . Smoking status: Former Smoker -- 1.00 packs/day for 30 years    Types: Cigarettes    Start date: 03/29/1985  . Smokeless tobacco: Never Used  . Alcohol Use: No   OB History   Grav Para Term Preterm Abortions TAB SAB Ect Mult Living                 Review of Systems  Constitutional: Negative for fever, chills and diaphoresis.  HENT: Negative for congestion, sinus pressure and sore throat.   Respiratory: Negative for cough, chest tightness, shortness of breath and wheezing.   Cardiovascular: Negative for chest pain, palpitations and leg swelling.  Gastrointestinal: Negative for nausea, vomiting, abdominal pain, diarrhea and constipation.  Musculoskeletal: Negative for back pain, neck pain and neck stiffness.  Skin: Negative for rash and wound.  Neurological: Negative for dizziness, weakness, light-headedness, numbness and headaches.  All other systems reviewed and are negative.     Allergies  Bactrim; Ciprofloxacin; Penicillins; Welchol; and Zocor  Home Medications   Prior to Admission medications   Medication Sig Start Date End Date Taking? Authorizing Provider  acetaminophen (TYLENOL) 325 MG tablet Take 2 tablets (650 mg total) by mouth every 6 (six) hours as needed for fever. 01/22/13   Erline Hau, MD  Cholecalciferol (VITAMIN D) 2000 UNITS tablet Take 2,000 Units by mouth daily.    Historical Provider, MD  donepezil (ARICEPT) 5 MG tablet Take 5 mg by mouth at bedtime.     Historical Provider, MD  esomeprazole (NEXIUM) 40 MG capsule Take 40 mg by mouth daily before breakfast.      Historical Provider, MD  ezetimibe (ZETIA) 10 MG tablet Take 5 mg by mouth.     Historical Provider, MD  ferrous sulfate 325 (65 FE) MG tablet Take 325 mg by mouth 2 (two) times daily.    Historical Provider, MD  fluticasone (FLONASE) 50 MCG/ACT nasal spray Place  2 sprays into the nose daily as needed for rhinitis.     Historical Provider, MD  guaiFENesin (MUCINEX) 600 MG 12 hr tablet Take 2 tablets (1,200 mg total) by mouth 2 (two) times daily. 01/22/13   Erline Hau, MD  levothyroxine (SYNTHROID, LEVOTHROID) 88 MCG tablet 125 mcg daily before breakfast.     Historical Provider, MD  magnesium oxide (MAG-OX) 400 (241.3 MG) MG tablet Take 1 tablet (400 mg total) by mouth 2 (two) times daily. For 7 days 01/22/13   Erline Hau, MD  Multiple Vitamin (MULTIVITAMIN) tablet Take 1 tablet by mouth daily.    Historical Provider, MD  nystatin-triamcinolone ointment (MYCOLOG) Apply 1 application topically 2 (two) times daily. To affected area 03/26/13   Lora Paula, MD  rosuvastatin (CRESTOR) 5 MG tablet Take 5 mg by mouth at bedtime.     Historical Provider, MD  vitamin C (ASCORBIC ACID) 500 MG tablet Take 500 mg by mouth daily.    Historical Provider, MD   Triage Vitals: BP 117/77  Pulse 121  Temp(Src) 98.5 F (36.9 C) (Oral)  Resp 18  Ht 5\' 4"  (1.626 m)  Wt 151 lb (68.493 kg)  BMI 25.91 kg/m2  SpO2 91% Physical Exam  Nursing note and vitals reviewed. Constitutional: She is oriented to person, place, and time. She appears well-developed and well-nourished. No distress.  HENT:  Head: Normocephalic and atraumatic.  Mouth/Throat: Oropharynx is clear and moist.  Eyes: EOM are normal. Pupils are equal, round, and reactive to light.  Neck: Normal range of motion. Neck supple.  Cardiovascular: Normal rate and regular rhythm.   Pulmonary/Chest: Effort normal. No respiratory distress. She has no wheezes. She has no rales.  No breath sounds in right lung fields.  Abdominal: Soft. Bowel sounds are normal. She exhibits no distension and no mass. There is no tenderness. There is no rebound and no guarding.  Musculoskeletal: Normal range of motion. She exhibits no edema and no tenderness.  No calf swelling or tenderness.   Neurological: She is alert and oriented to person, place, and time.  Moves all extremities without deficit. Sensation is intact. Patient is at her baseline mental status per family member.  Skin: Skin is warm and dry. No rash noted. No erythema.  Psychiatric: She has a normal mood and affect. Her behavior is normal.    ED Course  Procedures (including critical care time) Labs Review Labs Reviewed  CBC WITH DIFFERENTIAL - Abnormal; Notable for the following:    RBC  3.79 (*)    Hemoglobin 11.0 (*)    HCT 33.8 (*)    All other components within normal limits  COMPREHENSIVE METABOLIC PANEL - Abnormal; Notable for the following:    Sodium 134 (*)    Chloride 86 (*)    CO2 37 (*)    Glucose, Bld 132 (*)    Alkaline Phosphatase 121 (*)    GFR calc non Af Amer 84 (*)    All other components within normal limits  BLOOD GAS, ARTERIAL - Abnormal; Notable for the following:    pCO2 arterial 59.4 (*)    Bicarbonate 36.9 (*)    Acid-Base Excess 11.7 (*)    All other components within normal limits  TROPONIN I  PRO B NATRIURETIC PEPTIDE  URINALYSIS, ROUTINE W REFLEX MICROSCOPIC  I-STAT CG4 LACTIC ACID, ED    Imaging Review Dg Chest Port 1 View  11/18/2013   CLINICAL DATA:  80 year old female with cough congestion shortness of breath and right lung cancer. Initial encounter.  EXAM: PORTABLE CHEST - 1 VIEW  COMPARISON:  08/29/2013 chest CT and earlier.  FINDINGS: Portable AP upright view at 0033 hrs. There is new subtotal opacification of the right hemi thorax, which most resembles a large pleural effusion. This obscures the underlying known right lung mass. The left lung appears stable. Visualized tracheal air column is within normal limits.  IMPRESSION: New large right pleural effusion, obscuring the known right lung mass.   Electronically Signed   By: Lars Pinks M.D.   On: 11/18/2013 01:19     EKG Interpretation None      MDM   Final diagnoses:  Pleural effusion on right   Tachycardia   I personally performed the services described in this documentation, which was scribed in my presence. The recorded information has been reviewed and is accurate.  Chest x-ray with large right pleural effusion. Patient's intermittent confusion likely due to hypoxia. Is maintaining normal saturations on 3 L of oxygen. I discussed with hospitalist and she will see the patient in emergency department. Will need to be transferred to [facility] for thoracentesis.    Julianne Rice, MD 11/18/13 [phone]

## 2013-11-19 ENCOUNTER — Inpatient Hospital Stay (HOSPITAL_COMMUNITY): Payer: Medicare Other

## 2013-11-19 DIAGNOSIS — K5900 Constipation, unspecified: Secondary | ICD-10-CM

## 2013-11-19 LAB — BODY FLUID CELL COUNT WITH DIFFERENTIAL
EOS FL: 0 %
Lymphs, Fluid: 72 %
MONOCYTE-MACROPHAGE-SEROUS FLUID: 13 % — AB (ref 50–90)
Neutrophil Count, Fluid: 15 % (ref 0–25)
Total Nucleated Cell Count, Fluid: 597 cu mm (ref 0–1000)

## 2013-11-19 LAB — LACTATE DEHYDROGENASE, PLEURAL OR PERITONEAL FLUID: LD FL: 210 U/L — AB (ref 3–23)

## 2013-11-19 LAB — PROTEIN, BODY FLUID: Total protein, fluid: 4.9 g/dL

## 2013-11-19 LAB — GLUCOSE, SEROUS FLUID: Glucose, Fluid: 142 mg/dL

## 2013-11-19 MED ORDER — PANTOPRAZOLE SODIUM 40 MG PO TBEC
80.0000 mg | DELAYED_RELEASE_TABLET | Freq: Every day | ORAL | Status: DC
  Administered 2013-11-20 – 2013-11-22 (×3): 80 mg via ORAL
  Filled 2013-11-19 (×3): qty 2

## 2013-11-19 MED ORDER — LIDOCAINE HCL (PF) 1 % IJ SOLN
INTRAMUSCULAR | Status: AC
  Filled 2013-11-19: qty 10

## 2013-11-19 MED ORDER — FUROSEMIDE 20 MG PO TABS
20.0000 mg | ORAL_TABLET | Freq: Every day | ORAL | Status: DC
  Administered 2013-11-20 – 2013-11-22 (×3): 20 mg via ORAL
  Filled 2013-11-19 (×4): qty 1

## 2013-11-19 MED ORDER — CHLORHEXIDINE GLUCONATE CLOTH 2 % EX PADS
6.0000 | MEDICATED_PAD | Freq: Every day | CUTANEOUS | Status: DC
  Administered 2013-11-20 – 2013-11-22 (×3): 6 via TOPICAL

## 2013-11-19 MED ORDER — LORAZEPAM 2 MG/ML IJ SOLN
1.0000 mg | Freq: Once | INTRAMUSCULAR | Status: AC
  Administered 2013-11-19: 1 mg via INTRAVENOUS
  Filled 2013-11-19: qty 1

## 2013-11-19 MED ORDER — MUPIROCIN 2 % EX OINT
1.0000 "application " | TOPICAL_OINTMENT | Freq: Two times a day (BID) | CUTANEOUS | Status: DC
  Administered 2013-11-20 – 2013-11-22 (×5): 1 via NASAL
  Filled 2013-11-19: qty 22

## 2013-11-19 NOTE — Progress Notes (Signed)
OT Cancellation Note  Patient Details Name: Peggy Espinoza MRN: [id number] DOB: 13-Dec-1929   Cancelled Treatment:    Reason Eval/Treat Not Completed: Fatigue/lethargy limiting ability to participate - pt fatigued and unable to maintain arousal.  Will reatempt.  Darlina Rumpf [place], OTR/L [phone]  11/19/2013, 3:12 PM

## 2013-11-19 NOTE — Progress Notes (Signed)
Pt has been restless a large part of the night, pulled out her IV (IV team placed another- RN wrapped with gauze), pulling off 02, (sats dropped to high 70's) mumbling and reaching out into space on occasion. It HR has been increasing and was steady in low 130's. Dr informed, EKG completed and one time dose of ativan 1 mg given. Pt daughter at the bedside. Bed alarm on. RN monitoring closely,

## 2013-11-19 NOTE — Progress Notes (Signed)
Patient NO:MVEHM A Balis      DOB: Jan 01, 1930      CNO:[id number]   Palliative Medicine Team at [facility] Progress Note    Subjective: Drowsy this AM, daughter at bedside. Denies pain or dyspnea. Still not had BM.  Ate some breakfast this AM without much nausea.  No other acute concerns. Awaiting thoracentesis.      Filed Vitals:   11/19/13 0552  BP: 119/71  Pulse: 121  Temp: 97.9 F (36.6 C)  Resp: 18   Physical exam: General: Drowsy after valium last night,NAD, on O2 via [place] HEENT: [place], sclera anicteric  Chest: RRR  CVS: decreased breath sounds on right  Abdomen: soft, NT, ND  Ext:no lower ext edema  Skin: warm/dry    CBC    Component Value Date/Time   WBC 6.7 11/18/2013 0045   WBC 7.1 01/08/2013 1059   RBC 3.79* 11/18/2013 0045   RBC 3.79 01/08/2013 1059   HGB 11.0* 11/18/2013 0045   HGB 10.7* 01/08/2013 1059   HCT 33.8* 11/18/2013 0045   HCT 33.2* 01/08/2013 1059   PLT 393 11/18/2013 0045   PLT 347 01/08/2013 1059   MCV 89.2 11/18/2013 0045   MCV 87.6 01/08/2013 1059   MCH 29.0 11/18/2013 0045   MCH 28.2 01/08/2013 1059   MCHC 32.5 11/18/2013 0045   MCHC 32.2 01/08/2013 1059   RDW 12.9 11/18/2013 0045   RDW 13.6 01/08/2013 1059   LYMPHSABS 0.9 11/18/2013 0045   LYMPHSABS 2.1 01/08/2013 1059   MONOABS 0.6 11/18/2013 0045   MONOABS 0.5 01/08/2013 1059   EOSABS 0.1 11/18/2013 0045   EOSABS 0.1 01/08/2013 1059   BASOSABS 0.0 11/18/2013 0045   BASOSABS 0.0 01/08/2013 1059    CMP     Component Value Date/Time   NA 134* 11/18/2013 0045   NA 140 01/08/2013 1100   K 4.3 11/18/2013 0045   K 4.9 01/08/2013 1100   CL 86* 11/18/2013 0045   CO2 37* 11/18/2013 0045   CO2 31* 01/08/2013 1100   GLUCOSE 132* 11/18/2013 0045   GLUCOSE 131 01/08/2013 1100   BUN 13 11/18/2013 0045   BUN 13.2 01/08/2013 1100   CREATININE 0.58 11/18/2013 0045   CREATININE 0.7 01/08/2013 1100   CALCIUM 9.6 11/18/2013 0045   CALCIUM 10.1 01/08/2013 1100   PROT 7.9 11/18/2013 0045   PROT 7.8  01/08/2013 1100   ALBUMIN 3.7 11/18/2013 0045   ALBUMIN 3.3* 01/08/2013 1100   AST 20 11/18/2013 0045   AST 16 01/08/2013 1100   ALT 12 11/18/2013 0045   ALT 11 01/08/2013 1100   ALKPHOS 121* 11/18/2013 0045   ALKPHOS 120 01/08/2013 1100   BILITOT 0.3 11/18/2013 0045   BILITOT 0.29 01/08/2013 1100   GFRNONAA 84* 11/18/2013 0045   GFRAA >90 11/18/2013 0045    Assessment and plan: 80 yo female with PMHx of HTN, DM, HLD, mild dementia vs cognitive impairment, NSCLC (SCC) dx in 11/2012. Presented with right pleural effusion.    1. Code Status: DNR   2. Goals of Care:  See Initial consult note.  Awaiting thoracentesis with labs.  Daughter hopeful that she will qualify for rehab stay after this.  Will need to figure out long-term effusion management as this is highly likely to recur and this planning may need to be continued as outpatient.    3. Symptom Management:  1. Pleural Effusion-Suspect that this is likely malignant effusion given her history. Agree with thoracentesis and sending labs. MPE usually relates to  prognosis of less than 6 months (especially if not pursuing cancer directed therapy). High risk to reccur in next 30 days if malignant and she may need pleurex cath down the road if re-accumulates rapidly. Will likely need to be followed as outpatient. There is a chance for auto-pleurodesis with pleurex (though less than with talc). Other option would be Talc pleurodesis, but I am not sure how well she would tolerate this with her age and daughters stated desire to focus on quality of life over quantity.  -Awaiting Thorac this AM.  2. Anorexia-mirtazapine 3. Chronic Knee Pain- Tylenol PRN 4. Nausea- mirtazapine. PRN zofran.  5.   Constipation- On bisacodyl PRN, no BM. Will add doc/senna.   6.   Confusion- likely delirium in setting of baseline mild cognitive impairment or dementia. On valium at home PRN and received this morning. She is still drowsy.  Can consider haldol as well.   Management per primary team.   4. Psychosocial/Spiritual:Lives at home alone in [place]. Only living child is her daughter who lives in [place]. She is well supported by family. Strong faith background through entire family.    Doran Clay D.O. Palliative Medicine Team at [facility]  Pager: [phone] Team Phone: [phone]

## 2013-11-19 NOTE — Progress Notes (Signed)
PROGRESS NOTE  Peggy Espinoza XTK:[id number] DOB: 09-29-1929 DOA: 11/18/2013 PCP: Leonides Grills, MD  Assessment/Plan: Acute respiratory failure with hypoxia due to pleural effusion-  -thoracentesis-labs ordered  -patient with very mild dementia  Malignant neoplasm of upper lobe, bronchus or lung: last follow up with Dr. Lisbeth Renshaw: CT scan of the chest. This showed some improvement in the size of the right sided lung mass. Stable mediastinal lymph node present. I discussed this with her. I discussed once again my recommendation for the patient to be seen by medical oncology. However, the patient's daughter indicated again that the patient did not wish to do so at this time. We therefore discussed continued followup which will include a CT scan of the chest in 3 months. She indicated that the patient understands that she may need to see medical oncology at some point with progression of her stage III non-small cell lung cancer. Spoke at length with daughter- does not want oncology consult as patient did not tolerated chemo-  - per daughter mom thinks her cancer is cured.   -palliative care consult  Anemia in neoplastic disease   Pleural effusion on right   Code Status: DNR Family Communication: daughter Disposition Plan: SNF??   Consultants:  IR  Palliative care  Procedures:  HPI/Subjective: Anxious last nights, given ativan and is not sleeping- will follow commands   Objective: Filed Vitals:   11/19/13 0552  BP: 119/71  Pulse: 121  Temp: 97.9 F (36.6 C)  Resp: 18    Intake/Output Summary (Last 24 hours) at 11/19/13 1035 Last data filed at 11/19/13 0700  Gross per 24 hour  Intake    240 ml  Output    251 ml  Net    -11 ml   Filed Weights   11/17/13 2155 11/18/13 0531 11/19/13 0552  Weight: 68.493 kg (151 lb) 69.31 kg (152 lb 12.8 oz) 68.8 kg (151 lb 10.8 oz)    Exam:   General:  Elderly, NAD  Cardiovascular: tachy  Respiratory: diminished breath  sounds  Abdomen: +BS, soft  Musculoskeletal: moves all 4 ext   Data Reviewed: Basic Metabolic Panel:  Recent Labs Lab 11/18/13 0045  NA 134*  K 4.3  CL 86*  CO2 37*  GLUCOSE 132*  BUN 13  CREATININE 0.58  CALCIUM 9.6   Liver Function Tests:  Recent Labs Lab 11/18/13 0045  AST 20  ALT 12  ALKPHOS 121*  BILITOT 0.3  PROT 7.9  ALBUMIN 3.7   No results found for this basename: LIPASE, AMYLASE,  in the last 168 hours No results found for this basename: AMMONIA,  in the last 168 hours CBC:  Recent Labs Lab 11/18/13 0045  WBC 6.7  NEUTROABS 5.1  HGB 11.0*  HCT 33.8*  MCV 89.2  PLT 393   Cardiac Enzymes:  Recent Labs Lab 11/18/13 0045  TROPONINI <0.30   BNP (last 3 results)  Recent Labs  11/18/13 0045  PROBNP 289.1   CBG:  Recent Labs Lab 11/18/13 0545 11/18/13 1157 11/18/13 1650  GLUCAP 152* 160* 157*    Recent Results (from the past 240 hour(s))  MRSA PCR SCREENING     Status: Abnormal   Collection Time    11/18/13  5:50 AM      Result Value Ref Range Status   MRSA by PCR POSITIVE (*) NEGATIVE Final   Comment:            The GeneXpert MRSA Assay (FDA     approved for NASAL  specimens     only), is one component of a     comprehensive MRSA colonization     surveillance program. It is not     intended to diagnose MRSA     infection nor to guide or     monitor treatment for     MRSA infections.     RESULT CALLED TO, READ BACK BY AND VERIFIED WITH:     WASHINGTON,T RN [id number] 11/18/13 MITCHELL,L     Studies: Dg Chest Port 1 View  11/18/2013   CLINICAL DATA:  80 year old female with cough congestion shortness of breath and right lung cancer. Initial encounter.  EXAM: PORTABLE CHEST - 1 VIEW  COMPARISON:  08/29/2013 chest CT and earlier.  FINDINGS: Portable AP upright view at 0033 hrs. There is new subtotal opacification of the right hemi thorax, which most resembles a large pleural effusion. This obscures the underlying known right lung  mass. The left lung appears stable. Visualized tracheal air column is within normal limits.  IMPRESSION: New large right pleural effusion, obscuring the known right lung mass.   Electronically Signed   By: Lars Pinks M.D.   On: 11/18/2013 01:19    Scheduled Meds: . atorvastatin  10 mg Oral q1800  . donepezil  5 mg Oral QHS  . ezetimibe  5 mg Oral Daily  . levothyroxine  125 mcg Oral QAC breakfast  . mirtazapine  15 mg Oral QHS  . pantoprazole  40 mg Oral Daily  . sodium chloride  3 mL Intravenous Q12H  . sodium chloride  3 mL Intravenous Q12H   Continuous Infusions:  Antibiotics Given (last 72 hours)   None      Principal Problem:   Acute respiratory failure with hypoxia Active Problems:   Malignant neoplasm of upper lobe, bronchus or lung   Anemia in neoplastic disease   Encephalopathy acute   Pleural effusion on right   Pleural effusion, right    Time spent: 35 min    Jewell Ryans, [place] [facility] Pager [phone]. If 7PM-7AM, please contact night-coverage at [email or web address], password [facility] 11/19/2013, 10:35 AM  LOS: 1 day

## 2013-11-19 NOTE — Care Management Note (Signed)
    Page 1 of 1   11/22/2013     3:06:06 PM CARE MANAGEMENT NOTE 11/22/2013  Patient:  Peggy Espinoza, Peggy Espinoza   Account Number:  [id number]  Date Initiated:  11/19/2013  Documentation initiated by:  Daril Warga  Subjective/Objective Assessment:   Pt adm on 11/17/13 with pleural effusion requiring thoracentesis, hx of lung ca, hypoxia.  PTA, pt lives at home alone.  She has supportive daughter.     Action/Plan:   Will follow for dc needs as pt progresses.  Palliative care team following to assist with dc planning.   Anticipated DC Date:  11/22/2013   Anticipated DC Plan:  SKILLED NURSING FACILITY  In-house referral  Clinical Social Worker      DC Planning Services  CM consult      Choice offered to / List presented to:             Status of service:  Completed, signed off Medicare Important Message given?  YES (If response is "NO", the following Medicare IM given date fields will be blank) Date Medicare IM given:  11/20/2013 Medicare IM given by:  Dina Warbington Date Additional Medicare IM given:   Additional Medicare IM given by:    Discharge Disposition:  [place]  Per UR Regulation:  Reviewed for med. necessity/level of care/duration of stay  If discussed at [place] of Stay Meetings, dates discussed:   11/22/2013    Comments:  11/22/13 Ellan Lambert, RN, BSN [phone] Pt discharging to SNF today, per CSW arrangements.  11/20/13 Ellan Lambert, RN, BSN [phone] PT recommending SNF at [facility], and family requesting SNF for rehab.  CSW following to facilitate dc to SNF when medically stable for dc.  Will follow progress.

## 2013-11-19 NOTE — Procedures (Signed)
Successful US guided right thoracentesis. Yielded 1.2L of clear yellow fluid. Pt tolerated procedure well. No immediate complications.  Specimen was sent for labs. CXR ordered.  Ascencion Dike PA-C 11/19/2013 12:27 PM

## 2013-11-20 ENCOUNTER — Inpatient Hospital Stay (HOSPITAL_COMMUNITY): Payer: Medicare Other

## 2013-11-20 DIAGNOSIS — R000 Tachycardia, unspecified: Secondary | ICD-10-CM

## 2013-11-20 DIAGNOSIS — C3410 Malignant neoplasm of upper lobe, unspecified bronchus or lung: Secondary | ICD-10-CM

## 2013-11-20 LAB — PH, BODY FLUID: pH, Fluid: 8

## 2013-11-20 MED ORDER — IOHEXOL 350 MG/ML SOLN
100.0000 mL | Freq: Once | INTRAVENOUS | Status: AC | PRN
Start: 2013-11-20 — End: 2013-11-20
  Administered 2013-11-20: 100 mL via INTRAVENOUS

## 2013-11-20 MED ORDER — POLYETHYLENE GLYCOL 3350 17 G PO PACK
17.0000 g | PACK | Freq: Every day | ORAL | Status: DC | PRN
  Administered 2013-11-20: 17 g via ORAL
  Filled 2013-11-20: qty 1

## 2013-11-20 MED ORDER — DIAZEPAM 2 MG PO TABS
1.0000 mg | ORAL_TABLET | Freq: Every evening | ORAL | Status: DC | PRN
  Administered 2013-11-20: 1 mg via ORAL
  Filled 2013-11-20: qty 1

## 2013-11-20 MED ORDER — SENNOSIDES-DOCUSATE SODIUM 8.6-50 MG PO TABS
1.0000 | ORAL_TABLET | Freq: Two times a day (BID) | ORAL | Status: DC
  Administered 2013-11-20 – 2013-11-21 (×3): 1 via ORAL
  Filled 2013-11-20 (×6): qty 1

## 2013-11-20 MED ORDER — BOOST PLUS PO LIQD
237.0000 mL | Freq: Three times a day (TID) | ORAL | Status: DC
  Administered 2013-11-20 – 2013-11-22 (×4): 237 mL via ORAL
  Filled 2013-11-20 (×10): qty 237

## 2013-11-20 NOTE — Evaluation (Signed)
Physical Therapy Evaluation Patient Details Name: Peggy Espinoza MRN: [id number] DOB: 1929-10-02 Today's Date: 11/20/2013   History of Present Illness  Pt adm with acute respiratory failure with hypoxia due to pleural effusion. Pt with recent hx of lung CA and effusion possibly malignant.  Clinical Impression  Pt admitted with above. Pt currently with functional limitations due to the deficits listed below (see PT Problem List).  Pt will benefit from skilled PT to increase their independence and safety with mobility to allow discharge to the venue listed below. Feel pt could benefit from SNF if medical status stable. If continued decline expected hospice home may be more appropriate.      Follow Up Recommendations SNF    Equipment Recommendations  Wheelchair (measurements PT)    Recommendations for Other Services       Precautions / Restrictions Precautions Precautions: Fall      Mobility  Bed Mobility                  Transfers Overall transfer level: Needs assistance Equipment used: 2 person hand held assist Charlaine Dalton) Transfers: Sit to/from [facility] Sit to Stand: +2 physical assistance;Mod assist;Min assist Stand pivot transfers: Min assist;+2 physical assistance (With the [facility])       General transfer comment: Pt required 2 person mod A to stand with hand held and was unable to pivot to chair. With Stedy pt stood with 2 person min A and was pivoted to chair using the stedy.  Ambulation/Gait                Stairs            Wheelchair Mobility    Modified Rankin (Stroke Patients Only)       Balance Overall balance assessment: Needs assistance Sitting-balance support: No upper extremity supported;Feet supported Sitting balance-Leahy Scale: Fair     Standing balance support: Bilateral upper extremity supported Standing balance-Leahy Scale: Poor Standing balance comment: 2 person mod A to stand with flexed hips and trunk.                              Pertinent Vitals/Pain Pain Assessment: No/denies pain    Home Living Family/patient expects to be discharged to:: Private residence Living Arrangements: Alone Available Help at Discharge: Family;Available 24 hours/day Type of Home: House Home Access: Stairs to enter Entrance Stairs-Rails: Can reach both Entrance Stairs-Number of Steps: 5 Home Layout: One level Home Equipment: Walker - standard;Cane - single point;Shower seat;Grab bars - tub/shower      Prior Pension scheme manager        Extremity/Trunk Assessment   Upper Extremity Assessment: Defer to OT evaluation           Lower Extremity Assessment: Generalized weakness         Communication   Communication: No difficulties  Cognition Arousal/Alertness: Awake/alert Behavior During Therapy: WFL for tasks assessed/performed Overall Cognitive Status: History of cognitive impairments - at baseline                      General Comments      Exercises        Assessment/Plan    PT Assessment Patient needs continued PT services  PT Diagnosis Difficulty walking;Generalized weakness   PT Problem List Decreased strength;Decreased activity tolerance;Decreased balance;Decreased mobility;Decreased knowledge of  use of DME  PT Treatment Interventions DME instruction;Gait training;Functional mobility training;Therapeutic activities;Therapeutic exercise;Balance training;Patient/family education   PT Goals (Current goals can be found in the Care Plan section) Acute Rehab PT Goals Patient Stated Goal: Pt didn't state PT Goal Formulation: With patient Time For Goal Achievement: 12/04/13 Potential to Achieve Goals: Fair    Frequency Min 2X/week   Barriers to discharge        Co-evaluation               End of Session Equipment Utilized During Treatment: Other (comment) Charlaine Dalton) Activity Tolerance: Patient limited by fatigue Patient  left: in chair;with call bell/phone within reach;with family/visitor present Nurse Communication: Mobility status;Need for lift equipment (nurse present)         Time: 8828-0034 PT Time Calculation (min): 13 min   Charges:   PT Evaluation $Initial PT Evaluation Tier I: 1 Procedure PT Treatments $Gait Training: 8-22 mins   PT G Codes:          Mckynlee Luse Dec 19, 2013, 2:22 PM  [facility] PT [phone]

## 2013-11-20 NOTE — Clinical Social Work Note (Signed)
Clinical Social Work Department BRIEF PSYCHOSOCIAL ASSESSMENT 11/20/2013  Patient:  Peggy Espinoza, Peggy Espinoza     Account Number:  [id number]     Admit date:  11/17/2013  Clinical Social Worker:  Myles Lipps  Date/Time:  11/20/2013 04:00 PM  Referred by:  Physician  Date Referred:  11/20/2013 Referred for  SNF Placement   Other Referral:   Interview type:  Family Other interview type:   Spoke with patient daughter over the phone and son in law at bedside    PSYCHOSOCIAL DATA Living Status:  ALONE Admitted from facility:   Level of care:   Primary support name:  Audree Camel  [phone] Primary support relationship to patient:  CHILD, ADULT Degree of support available:   Strong    CURRENT CONCERNS Current Concerns  Post-Acute Placement   Other Concerns:    SOCIAL WORK ASSESSMENT / PLAN Clinical Social Worker met with patient and patient son in law at bedside and spoke with patient daughter over the phone to offer support and discuss patient plans at discharge.  With patient permission, CSW spoke with patient daughter and son in law who state that patient has been living home alone.  Patient does not like to further discuss reality, however patient family is aware that patient will need a higher level of care at discharge. Patient daughter states that patient has been to [facility] in the past and did extremely well.  Patient family feels that if patient were able to return to their short term rehab wing in a private room, she would make meaningful gains.  CSW spoke with admissions coordinator at [facility] who states that they will offer a bed and patient will be able to go to a private room on the short term rehab hall.  CSW to update patient family at bedside and initiate insurance authorization once clinicals are available to submit.  CSW remains available for support and to facilitate patient discharge needs once medically ready.   Assessment/plan status:  Psychosocial  Support/Ongoing Assessment of Needs Other assessment/ plan:   Information/referral to community resources:   Holiday representative to submit for insurance authorization and was a patient advocate with facility regarding placement in short term rehab with a private room.    PATIENT'S/FAMILY'S RESPONSE TO PLAN OF CARE: Patient alert and oriented x3, however with severe periods of confusion.  Patient did provide permission to further communicate with her family regarding discharge planning needs.  Patient family offers a good support system to patient and are realistic regarding patient long term needs.  Patient daughter verbalized understanding of CSW role and appreciation for support and concern.

## 2013-11-20 NOTE — Progress Notes (Addendum)
INITIAL NUTRITION ASSESSMENT  DOCUMENTATION CODES Per approved criteria  -Not Applicable   INTERVENTION: Boost Plus po TID, each supplement provides 360 kcal and 14 grams of protein RD to follow for nutrition care plan  NUTRITION DIAGNOSIS: Inadequate oral intake related to limited appetite as evidenced by family report  Goal: Pt to meet >/= 90% of their estimated nutrition needs   Monitor:  PO & supplemental intake, goals of care, weight, labs, I/O's  Reason for Assessment: Malnutrition Screening Tool Report  80 y.o. female  Admitting Dx: Acute respiratory failure with hypoxia  ASSESSMENT: [name] with h/o lung cancer dx last year, mild dementia lives alone comes in with daughter having noted to be more confused than normal. She completed her xrt for her lung cancer, but only tolerated one dose of chemo and has not been on anything in over 6 months. dtr reports to be that "her cancer is cured", and that is what family has been telling the patient for over 6 months. On arrival to ED, pt was hypoxic in the 70s. Has no oxygen requirements at home.   Patient sleeping upon RD visit; son-in-law at bedside who reports patient has had a decreased appetite, however, this AM for breakfast did fairly well; PO intake variable at 25-75% per flowsheet records; per wt readings, pt has had a 6% weight loss since beginning of June 2015 (not significant for time frame); son-in-law reports pt drinks Boost supplements at home; amenable to RD ordering.  Palliative Care Team notes reviewed.  Overall prognosis poor.  No muscle or subcutaneous fat depletion noticed.  Height: Ht Readings from Last 1 Encounters:  11/18/13 5\' 4"  (1.626 m)    Weight: Wt Readings from Last 1 Encounters:  11/20/13 147 lb (66.679 kg)    Ideal Body Weight: 120 lb  % Ideal Body Weight: 122%  Wt Readings from Last 10 Encounters:  11/20/13 147 lb (66.679 kg)  08/30/13 156 lb (70.761 kg)  05/16/13 157 lb 4.8 oz  (71.351 kg)  04/12/13 152 lb 14.4 oz (69.355 kg)  04/06/13 154 lb 11.2 oz (70.171 kg)  03/30/13 151 lb 11.2 oz (68.811 kg)  03/23/13 149 lb 9.6 oz (67.858 kg)  03/16/13 152 lb 1.6 oz (68.992 kg)  03/09/13 150 lb (68.04 kg)  03/08/13 157 lb (71.215 kg)    Usual Body Weight: 156 lb  % Usual Body Weight: 94%  BMI:  Body mass index is 25.22 kg/(m^2).  Estimated Nutritional Needs: Kcal: 1500-1700 Protein: 70-80 gm Fluid: 1.5-1.7 L  Skin: Intact  Diet Order: Cardiac  EDUCATION NEEDS: -No education needs identified at this time   Intake/Output Summary (Last 24 hours) at 11/20/13 1402 Last data filed at 11/20/13 1125  Gross per 24 hour  Intake    120 ml  Output    250 ml  Net   -130 ml   Labs:   Recent Labs Lab 11/18/13 0045  NA 134*  K 4.3  CL 86*  CO2 37*  BUN 13  CREATININE 0.58  CALCIUM 9.6  GLUCOSE 132*    CBG (last 3)   Recent Labs  11/18/13 0545 11/18/13 1157 11/18/13 1650  GLUCAP 152* 160* 157*    Scheduled Meds: . atorvastatin  10 mg Oral q1800  . Chlorhexidine Gluconate Cloth  6 each Topical Q0600  . donepezil  5 mg Oral QHS  . ezetimibe  5 mg Oral Daily  . furosemide  20 mg Oral Daily  . levothyroxine  125 mcg Oral QAC  breakfast  . mirtazapine  15 mg Oral QHS  . mupirocin ointment  1 application Nasal BID  . pantoprazole  80 mg Oral Daily  . senna-docusate  1 tablet Oral BID  . sodium chloride  3 mL Intravenous Q12H  . sodium chloride  3 mL Intravenous Q12H    Continuous Infusions:   Past Medical History  Diagnosis Date  . HTN (hypertension)   . Hyperlipidemia   . Thyroid disease   . Diabetes mellitus   . Anxiety   . History of stress test 11/2012    [facility]   . Hypothyroidism   . Dementia     pt. states daughter got her started on Aricept   . GERD (gastroesophageal reflux disease)   . Arthritis     knees, legs, back  . Bladder incontinence     wears pad always   . Lung cancer   .  History of radiation therapy 01/08/13-04/12/13    70Gy total  . Hx of radiation therapy     Past Surgical History  Procedure Laterality Date  . Arm fracture surgery    . Tonsillectomy    . Appendectomy    . Abdominal hysterectomy    . Lipoma removal    . Eye surgery    . Cholecystectomy    . Cataract extraction    . Fracture surgery      plate in R hand - long time ago  . Carpal tunnel release      R arm  . Video bronchoscopy N/A 12/15/2012    Procedure: VIDEO BRONCHOSCOPY;  Surgeon: Melrose Nakayama, MD;  Location: [place];  Service: Thoracic;  Laterality: N/A;    Arthur Holms, RD, LDN Pager #: [phone] After-Hours Pager #: [phone]

## 2013-11-20 NOTE — Progress Notes (Signed)
CAlled daughter to update on CTA- no answer, so left message Peggy Espinoza

## 2013-11-20 NOTE — Clinical Social Work Placement (Addendum)
Clinical Social Work Department CLINICAL SOCIAL WORK PLACEMENT NOTE 11/20/2013  Patient:  Peggy Espinoza, Peggy Espinoza  Account Number:  [id number] Admit date:  11/17/2013  Clinical Social Worker:  Barbette Or, LCSW  Date/time:  11/20/2013 04:00 PM  Clinical Social Work is seeking post-discharge placement for this patient at the following level of care:   [facility]   (*CSW will update this form in Epic as items are completed)   11/20/2013  Patient/family provided with [place] Department of Clinical Social Work's list of facilities offering this level of care within the geographic area requested by the patient (or if unable, by the patient's family).  11/20/2013  Patient/family informed of their freedom to choose among providers that offer the needed level of care, that participate in Medicare, Medicaid or managed care program needed by the patient, have an available bed and are willing to accept the patient.  11/20/2013  Patient/family informed of [facility]' ownership interest in [facility], as well as of the fact that they are under no obligation to receive care at this facility.  PASARR submitted to EDS on  PASARR number received on   FL2 transmitted to all facilities in geographic area requested by pt/family on  11/20/2013 FL2 transmitted to all facilities within larger geographic area on   Patient informed that his/her managed care company has contracts with or will negotiate with  certain facilities, including the following:     Patient/family informed of bed offers received:  11/20/2013 Patient chooses bed at [facility] Physician recommends and patient chooses bed at    Patient to be transferred to [facility] on   Patient to be transferred to facility by Ambulance - [facility] Patient and family notified of transfer on 11/22/2013 Name of family member notified:  [name] (patient daughter at bedside)  The following physician  request were entered in Epic:   Additional Comments: 11/20/13 Patient with pre-existing PASARR

## 2013-11-20 NOTE — Progress Notes (Signed)
Patient QJ:JHERD A Espinoza      DOB: 23-Jun-1929      EYC:[id number]   Palliative Medicine Team at [facility] Progress Note    Subjective: Drowsy this AM.  Family feels like her confusion is getting better but continues to wax and wane.  Needed valium last night. Reported eating well this morning. Still no BM. Still hypoxic after thora.     Filed Vitals:   11/20/13 1015  BP:   Pulse: 125  Temp:   Resp:    Physical exam: General: Drowsy after valium last night,NAD, on O2 via [place]  HEENT: , sclera anicteric  Chest: RRR  CVS: decreased breath sounds on right but improved after thora Abdomen: soft, NT, ND  Ext:no lower ext edema  Skin: warm/dry   CBC    Component Value Date/Time   WBC 6.7 11/18/2013 0045   WBC 7.1 01/08/2013 1059   RBC 3.79* 11/18/2013 0045   RBC 3.79 01/08/2013 1059   HGB 11.0* 11/18/2013 0045   HGB 10.7* 01/08/2013 1059   HCT 33.8* 11/18/2013 0045   HCT 33.2* 01/08/2013 1059   PLT 393 11/18/2013 0045   PLT 347 01/08/2013 1059   MCV 89.2 11/18/2013 0045   MCV 87.6 01/08/2013 1059   MCH 29.0 11/18/2013 0045   MCH 28.2 01/08/2013 1059   MCHC 32.5 11/18/2013 0045   MCHC 32.2 01/08/2013 1059   RDW 12.9 11/18/2013 0045   RDW 13.6 01/08/2013 1059   LYMPHSABS 0.9 11/18/2013 0045   LYMPHSABS 2.1 01/08/2013 1059   MONOABS 0.6 11/18/2013 0045   MONOABS 0.5 01/08/2013 1059   EOSABS 0.1 11/18/2013 0045   EOSABS 0.1 01/08/2013 1059   BASOSABS 0.0 11/18/2013 0045   BASOSABS 0.0 01/08/2013 1059    CMP     Component Value Date/Time   NA 134* 11/18/2013 0045   NA 140 01/08/2013 1100   K 4.3 11/18/2013 0045   K 4.9 01/08/2013 1100   CL 86* 11/18/2013 0045   CO2 37* 11/18/2013 0045   CO2 31* 01/08/2013 1100   GLUCOSE 132* 11/18/2013 0045   GLUCOSE 131 01/08/2013 1100   BUN 13 11/18/2013 0045   BUN 13.2 01/08/2013 1100   CREATININE 0.58 11/18/2013 0045   CREATININE 0.7 01/08/2013 1100   CALCIUM 9.6 11/18/2013 0045   CALCIUM 10.1 01/08/2013 1100   PROT 7.9 11/18/2013 0045    PROT 7.8 01/08/2013 1100   ALBUMIN 3.7 11/18/2013 0045   ALBUMIN 3.3* 01/08/2013 1100   AST 20 11/18/2013 0045   AST 16 01/08/2013 1100   ALT 12 11/18/2013 0045   ALT 11 01/08/2013 1100   ALKPHOS 121* 11/18/2013 0045   ALKPHOS 120 01/08/2013 1100   BILITOT 0.3 11/18/2013 0045   BILITOT 0.29 01/08/2013 1100   GFRNONAA 84* 11/18/2013 0045   GFRAA >90 11/18/2013 0045     Assessment and plan: 80 yo female with PMHx of HTN, DM, HLD, mild dementia vs cognitive impairment, NSCLC (SCC) dx in 11/2012. Presented with right pleural effusion.   1. Code Status: DNR   2. Goals of Care:  See Initial consult note. Daughter hopeful that she will qualify for rehab stay after this. Will need to figure out long-term effusion management as this is highly likely to recur and this planning may need to be continued as outpatient. If going to nursing home, would recommend outpatient Palliative Medicine consultation.    3. Symptom Management:  1. Pleural Effusion-Suspect that this is likely malignant effusion given her history. Agree with  thoracentesis and sending labs. MPE usually relates to prognosis of less than 6 months (especially if not pursuing cancer directed therapy). High risk to reccur in next 30 days if malignant and she may need pleurex cath down the road if re-accumulates rapidly. Will likely need to be followed as outpatient. There is a chance for auto-pleurodesis with pleurex (though less than with talc). Other option would be Talc pleurodesis, but I am not sure how well she would tolerate this with her age and daughters stated desire to focus on quality of life over quantity.  -exudative based on LDH. Awaiting cytology (which can have false negatives). Likely MPE 2. Hypoxia/Tachycardia/Delirium- surprising that hypoxia especially did not improve after thora. Discussed with Dr Eliseo Squires. I feel like it would be reasonable to eval further with CTA to r/o clot and or PNA that could be hidden on CXR 2/2 large  effusion.  3. Anorexia- improving with mirtazapine 4. Nausea- mirtazapine. PRN zofran. Improving                   5.   Constipation- On doc/senna.  Will add PRN miralax.    4. Psychosocial/Spiritual:Lives at home alone in [place]. Only living child is her daughter who lives in [place]. She is well supported by family. Strong faith background through entire family.    Doran Clay D.O. Palliative Medicine Team at [facility]  Pager: [phone] Team Phone: [phone]

## 2013-11-20 NOTE — Progress Notes (Signed)
PROGRESS NOTE  Peggy Espinoza BMW:[id number] DOB: 1929/07/10 DOA: 11/18/2013 PCP: Leonides Grills, MD  Assessment/Plan: Acute respiratory failure with hypoxia due to pleural effusion-  -s/p thoracentesis- await cytology; appears to be -x ray shows persistent pleural fluid despite -patient with very mild dementia -still tachy and hypoxic after thoracentesis- will get CTA to r/o out PE/PNA  Malignant neoplasm of upper lobe, bronchus or lung: last follow up with Dr. Lisbeth Renshaw: CT scan of the chest. This showed some improvement in the size of the right sided lung mass. Stable mediastinal lymph node present. I discussed this with her. I discussed once again my recommendation for the patient to be seen by medical oncology. However, the patient's daughter indicated again that the patient did not wish to do so at this time. We therefore discussed continued followup which will include a CT scan of the chest in 3 months. She indicated that the patient understands that she may need to see medical oncology at some point with progression of her stage III non-small cell lung cancer. Spoke at length with daughter- does not want oncology consult as patient did not tolerated chemo-  - per daughter mom thinks her cancer is cured.   -palliative care consult  Anemia in neoplastic disease   persistent Pleural effusion on right  Poor overall prognosis  Code Status: DNR Family Communication: daughter Disposition Plan: SNF??   Consultants:  IR  Palliative care  Procedures:  HPI/Subjective: Still hypoxic and tachycardic Having episodes of confusion at night   Objective: Filed Vitals:   11/20/13 1015  BP:   Pulse: 125  Temp:   Resp:     Intake/Output Summary (Last 24 hours) at 11/20/13 1038 Last data filed at 11/20/13 0700  Gross per 24 hour  Intake    120 ml  Output      0 ml  Net    120 ml   Filed Weights   11/18/13 0531 11/19/13 0552 11/20/13 0300  Weight: 69.31 kg (152 lb 12.8 oz)  68.8 kg (151 lb 10.8 oz) 66.679 kg (147 lb)    Exam:   General:  Elderly, NAD  Cardiovascular: tachy  Respiratory: diminished breath sounds  Abdomen: +BS, soft  Musculoskeletal: moves all 4 ext   Data Reviewed: Basic Metabolic Panel:  Recent Labs Lab 11/18/13 0045  NA 134*  K 4.3  CL 86*  CO2 37*  GLUCOSE 132*  BUN 13  CREATININE 0.58  CALCIUM 9.6   Liver Function Tests:  Recent Labs Lab 11/18/13 0045  AST 20  ALT 12  ALKPHOS 121*  BILITOT 0.3  PROT 7.9  ALBUMIN 3.7   No results found for this basename: LIPASE, AMYLASE,  in the last 168 hours No results found for this basename: AMMONIA,  in the last 168 hours CBC:  Recent Labs Lab 11/18/13 0045  WBC 6.7  NEUTROABS 5.1  HGB 11.0*  HCT 33.8*  MCV 89.2  PLT 393   Cardiac Enzymes:  Recent Labs Lab 11/18/13 0045  TROPONINI <0.30   BNP (last 3 results)  Recent Labs  11/18/13 0045  PROBNP 289.1   CBG:  Recent Labs Lab 11/18/13 0545 11/18/13 1157 11/18/13 1650  GLUCAP 152* 160* 157*    Recent Results (from the past 240 hour(s))  MRSA PCR SCREENING     Status: Abnormal   Collection Time    11/18/13  5:50 AM      Result Value Ref Range Status   MRSA by PCR POSITIVE (*) NEGATIVE Final  Comment:            The GeneXpert MRSA Assay (FDA     approved for NASAL specimens     only), is one component of a     comprehensive MRSA colonization     surveillance program. It is not     intended to diagnose MRSA     infection nor to guide or     monitor treatment for     MRSA infections.     RESULT CALLED TO, READ BACK BY AND VERIFIED WITH:     WASHINGTON,T RN 0710 11/18/13 MITCHELL,L     Studies: Dg Chest 1 View  11/19/2013   CLINICAL DATA:  Post thoracentesis  EXAM: CHEST - 1 VIEW  COMPARISON:  11/18/2013  FINDINGS: Enlargement of cardiac silhouette.  Large RIGHT pleural effusion, slightly decreased in the previous exam post thoracentesis.  No definite pneumothorax.  Minimal atelectasis  at LEFT base.  LEFT lung otherwise clear.  Bones demineralized.  IMPRESSION: No pneumothorax following RIGHT thoracentesis.  Persistent large RIGHT pleural effusion though decreased from previous exam.   Electronically Signed   By: Lavonia Dana M.D.   On: 11/19/2013 12:58   US Thoracentesis Asp Pleural Space W/img Guide  11/19/2013   CLINICAL DATA:  Lung cancer. Right-sided pleural effusion. Request diagnostic and therapeutic thoracentesis.  EXAM: ULTRASOUND GUIDED RIGHT THORACENTESIS  COMPARISON:  None.  PROCEDURE: An ultrasound guided thoracentesis was thoroughly discussed with the patient and questions answered. The benefits, risks, alternatives and complications were also discussed. The patient understands and wishes to proceed with the procedure. Written consent was obtained.  Ultrasound was performed to localize and mark an adequate pocket of fluid in the right chest. The area was then prepped and draped in the normal sterile fashion. 1% Lidocaine was used for local anesthesia. Under ultrasound guidance a 19 gauge Yueh catheter was introduced. Thoracentesis was performed. The catheter was removed and a dressing applied.  Complications: Patient experienced cough and discomfort. The procedure was stopped at this point.  FINDINGS: A total of approximately 1.2 L of clear yellow fluid was removed. A fluid sample well assent for laboratory analysis.  IMPRESSION: Successful ultrasound guided right thoracentesis yielding 1.2 L of pleural fluid.  Read by: Ascencion Dike PA-C   Electronically Signed   By: Sandi Mariscal M.D.   On: 11/19/2013 12:42    Scheduled Meds: . atorvastatin  10 mg Oral q1800  . Chlorhexidine Gluconate Cloth  6 each Topical Q0600  . donepezil  5 mg Oral QHS  . ezetimibe  5 mg Oral Daily  . furosemide  20 mg Oral Daily  . levothyroxine  125 mcg Oral QAC breakfast  . mirtazapine  15 mg Oral QHS  . mupirocin ointment  1 application Nasal BID  . pantoprazole  80 mg Oral Daily  .  senna-docusate  1 tablet Oral BID  . sodium chloride  3 mL Intravenous Q12H  . sodium chloride  3 mL Intravenous Q12H   Continuous Infusions:  Antibiotics Given (last 72 hours)   None      Principal Problem:   Acute respiratory failure with hypoxia Active Problems:   Malignant neoplasm of upper lobe, bronchus or lung   Anemia in neoplastic disease   Encephalopathy acute   Pleural effusion on right   Pleural effusion, right    Time spent: 25 min    Turner Baillie, [facility] Pager [phone]. If 7PM-7AM, please contact night-coverage at [email or web address], password [facility] 11/20/2013, 10:38  AM  LOS: 2 days

## 2013-11-20 NOTE — Progress Notes (Signed)
OT Cancellation Note  Patient Details Name: Peggy Espinoza MRN: [id number] DOB: May 16, 1929   Cancelled Treatment:    Reason Eval/Treat Not Completed: Patient at procedure or test/ unavailable - will reattempt.  Darlina Rumpf [place], OTR/L [phone]  11/20/2013, 3:15 PM

## 2013-11-21 ENCOUNTER — Encounter (HOSPITAL_COMMUNITY): Payer: Self-pay | Admitting: Internal Medicine

## 2013-11-21 DIAGNOSIS — J189 Pneumonia, unspecified organism: Secondary | ICD-10-CM | POA: Diagnosis present

## 2013-11-21 DIAGNOSIS — Z515 Encounter for palliative care: Secondary | ICD-10-CM

## 2013-11-21 DIAGNOSIS — Z66 Do not resuscitate: Secondary | ICD-10-CM | POA: Diagnosis present

## 2013-11-21 DIAGNOSIS — F0390 Unspecified dementia without behavioral disturbance: Secondary | ICD-10-CM | POA: Diagnosis present

## 2013-11-21 LAB — GLUCOSE, CAPILLARY: GLUCOSE-CAPILLARY: 169 mg/dL — AB (ref 70–99)

## 2013-11-21 NOTE — Progress Notes (Signed)
Palliative Care Team at [facility] Note   SUBJECTIVE: Improved today in general. More talkative, eating better. Asking when she can go home.   Interval Events: Goals of care meeting 11/18/2013  OBJECTIVE: Vital Signs: BP 111/73  Pulse 101  Temp(Src) 97.2 F (36.2 C) (Oral)  Resp 20  Ht 5\' 4"  (1.626 m)  Wt 69.264 kg (152 lb 11.2 oz)  BMI 26.20 kg/m2  SpO2 97%   Intake and Output: 08/25 0701 - 08/26 0700 In: 120 [P.O.:120] Out: 250 [Urine:250]  Physical Exam: General: Vital signs reviewed and noted.  in no acute distress; alert, appropriate and cooperative throughout examination.  Head: Normocephalic, atraumatic.  Lungs:  +scattered wheezing  Heart: Tachycardic  Abdomen:  BS normoactive. Soft, Nondistended, non-tender.  No masses or organomegaly.  Extremities: trace edema.    Allergies  Allergen Reactions  . Bactrim [Sulfamethoxazole-Trimethoprim] Hives  . Ciprofloxacin Hives  . Penicillins Swelling    Per MD, not throat swelling  . Welchol [Colesevelam Hcl] Other (See Comments)    Body aches  . Zocor [Simvastatin] Other (See Comments)    Muscle aches    Medications: Scheduled Meds:  . atorvastatin  10 mg Oral q1800  . Chlorhexidine Gluconate Cloth  6 each Topical Q0600  . donepezil  5 mg Oral QHS  . ezetimibe  5 mg Oral Daily  . furosemide  20 mg Oral Daily  . lactose free nutrition  237 mL Oral TID WC  . levothyroxine  125 mcg Oral QAC breakfast  . mirtazapine  15 mg Oral QHS  . mupirocin ointment  1 application Nasal BID  . pantoprazole  80 mg Oral Daily  . senna-docusate  1 tablet Oral BID  . sodium chloride  3 mL Intravenous Q12H  . sodium chloride  3 mL Intravenous Q12H    Continuous Infusions:    PRN Meds: sodium chloride, acetaminophen, bisacodyl, diazepam, naproxen, ondansetron (ZOFRAN) IV, ondansetron, polyethylene glycol, sodium chloride  Stool Softner: yes, PRN  Palliative Performance Scale: 40 %   Labs: CBC    Component  Value Date/Time   WBC 6.7 11/18/2013 0045   WBC 7.1 01/08/2013 1059   RBC 3.79* 11/18/2013 0045   RBC 3.79 01/08/2013 1059   HGB 11.0* 11/18/2013 0045   HGB 10.7* 01/08/2013 1059   HCT 33.8* 11/18/2013 0045   HCT 33.2* 01/08/2013 1059   PLT 393 11/18/2013 0045   PLT 347 01/08/2013 1059   MCV 89.2 11/18/2013 0045   MCV 87.6 01/08/2013 1059   MCH 29.0 11/18/2013 0045   MCH 28.2 01/08/2013 1059   MCHC 32.5 11/18/2013 0045   MCHC 32.2 01/08/2013 1059   RDW 12.9 11/18/2013 0045   RDW 13.6 01/08/2013 1059   LYMPHSABS 0.9 11/18/2013 0045   LYMPHSABS 2.1 01/08/2013 1059   MONOABS 0.6 11/18/2013 0045   MONOABS 0.5 01/08/2013 1059   EOSABS 0.1 11/18/2013 0045   EOSABS 0.1 01/08/2013 1059   BASOSABS 0.0 11/18/2013 0045   BASOSABS 0.0 01/08/2013 1059    CMET     Component Value Date/Time   NA 134* 11/18/2013 0045   NA 140 01/08/2013 1100   K 4.3 11/18/2013 0045   K 4.9 01/08/2013 1100   CL 86* 11/18/2013 0045   CO2 37* 11/18/2013 0045   CO2 31* 01/08/2013 1100   GLUCOSE 132* 11/18/2013 0045   GLUCOSE 131 01/08/2013 1100   BUN 13 11/18/2013 0045   BUN 13.2 01/08/2013 1100   CREATININE 0.58 11/18/2013 0045   CREATININE 0.7  01/08/2013 1100   CALCIUM 9.6 11/18/2013 0045   CALCIUM 10.1 01/08/2013 1100   PROT 7.9 11/18/2013 0045   PROT 7.8 01/08/2013 1100   ALBUMIN 3.7 11/18/2013 0045   ALBUMIN 3.3* 01/08/2013 1100   AST 20 11/18/2013 0045   AST 16 01/08/2013 1100   ALT 12 11/18/2013 0045   ALT 11 01/08/2013 1100   ALKPHOS 121* 11/18/2013 0045   ALKPHOS 120 01/08/2013 1100   BILITOT 0.3 11/18/2013 0045   BILITOT 0.29 01/08/2013 1100   GFRNONAA 84* 11/18/2013 0045   GFRAA >90 11/18/2013 0045     Imaging: CTA Lungs: large effusion persists, worsening of lung ca  ASSESSMENT/ PLAN: I spoke with patient and also with her daughter outside of the room regarding results of CT, goals of care and medical decision making in light of her poor prognosis. Suprisingly, Rudi remains much less symptomatic than  her CTA would suggest-she does not complain of dyspnea, and mostly has her usual joint pains from OA, she complains of weakness in her legs. She has no memory of being told she has lung cancer and tells me she does not know anything about her condition- this is probably a combination of short term memory loss or delirium and denial/coping mechanism or she may also have occult brain involvement which wouldn't be unusual given the size and extent of her tumor.  Overall her goals and her daughter's goals for her mother are comfort, quality and for her to be in a safe environment. She has dramatically improved today which makes this more challenging in some ways in terms of determining an appropriate discharge plan- her lung cancer is worse and I expect she will have a dramatic quick decline- my concern is if she goes to SNF (for rehab) she will be re-admitted or sent back to ED and spend her remaining days rehabing instead of with full comfort -I have provided education to her daughter on initiating hospice services in the event of a decline if they choose rehab- she does not have medicaid and cannot pay Out of pocket for SNF so that she can get hospice services.   Her daughter is going to consider the following options:  1. Home with Hospice- barrier is getting consistent ATC from family/safety of this plan 2. SNF for rehab with palliative services to follow and transition to Hospice when she declines with a MOST form that says do not re-hospitalize/comfort care. 3. If she declines prior to discharge she may need a [place] for this plan is that she may have >2 weeks and currently does not have a symptom to manage aggressively although I anticipate this will change.  Will check in tomorrow -daughter is going to discuss options with her husband and let us know.   Time In: 1230 Time Out: 1:40 Total Time Spent with Patient: 70 minutes Total Overall Time: 70 minutes   Greater than 50%  of  this time was spent counseling and coordinating care related to the above assessment and plan.   Acquanetta Chain, DO  11/21/2013, 1:56 PM  Please contact Palliative Medicine Team phone at [phone] for questions and concerns.

## 2013-11-21 NOTE — Progress Notes (Signed)
PROGRESS NOTE  Peggy Espinoza YIR:[id number] DOB: 13-Jul-1929 DOA: 11/18/2013 PCP: Leonides Grills, MD 80 yo female with h/o lung cancer dx last year, mild dementia lives alone comes in with daughter having noted to be more confused than normal. Pt denies any pain, no sob, no cough. No swelling in her legs. Appetite is good and no wt loss. She completed her xrt for her lung cancer, but only tolerated one dose of chemo and has not been on anything in over 6 months. dtr reports to be that "her cancer is cured", and that is what family has been telling the patient for over 6 months. On arrival to ED, pt was hypoxic in the 70s. Has no oxygen requirements at home.  S/p thoracentesis with minimal improvement.  Family does not want chest tube or chemo.    Assessment/Plan: Acute respiratory failure with hypoxia due to pleural effusion-  -s/p thoracentesis- await cytology; appears to be -x ray shows persistent pleural fluid despite -patient with very mild dementia -CTA shows progression of cancer  Malignant neoplasm of upper lobe, bronchus or lung: l No further treatment  Anemia in neoplastic disease   persistent Pleural effusion on right  Poor overall prognosis  Code Status: DNR Family Communication: daughter Disposition Plan: SNF with palliative services?   Consultants:  IR  Palliative care  Procedures:  HPI/Subjective: No SOB, no CP Feeling better   Objective: Filed Vitals:   11/21/13 0556  BP: 111/73  Pulse: 101  Temp: 97.2 F (36.2 C)  Resp: 20    Intake/Output Summary (Last 24 hours) at 11/21/13 1346 Last data filed at 11/21/13 0931  Gross per 24 hour  Intake    240 ml  Output      0 ml  Net    240 ml   Filed Weights   11/19/13 0552 11/20/13 0300 11/21/13 0556  Weight: 68.8 kg (151 lb 10.8 oz) 66.679 kg (147 lb) 69.264 kg (152 lb 11.2 oz)    Exam:   General:  Elderly, NAD  Cardiovascular: tachy  Respiratory: diminished breath sounds  Abdomen: +BS,  soft  Musculoskeletal: moves all 4 ext   Data Reviewed: Basic Metabolic Panel:  Recent Labs Lab 11/18/13 0045  NA 134*  K 4.3  CL 86*  CO2 37*  GLUCOSE 132*  BUN 13  CREATININE 0.58  CALCIUM 9.6   Liver Function Tests:  Recent Labs Lab 11/18/13 0045  AST 20  ALT 12  ALKPHOS 121*  BILITOT 0.3  PROT 7.9  ALBUMIN 3.7   No results found for this basename: LIPASE, AMYLASE,  in the last 168 hours No results found for this basename: AMMONIA,  in the last 168 hours CBC:  Recent Labs Lab 11/18/13 0045  WBC 6.7  NEUTROABS 5.1  HGB 11.0*  HCT 33.8*  MCV 89.2  PLT 393   Cardiac Enzymes:  Recent Labs Lab 11/18/13 0045  TROPONINI <0.30   BNP (last 3 results)  Recent Labs  11/18/13 0045  PROBNP 289.1   CBG:  Recent Labs Lab 11/18/13 0545 11/18/13 1157 11/18/13 1650 11/20/13 2332  GLUCAP 152* 160* 157* 169*    Recent Results (from the past 240 hour(s))  MRSA PCR SCREENING     Status: Abnormal   Collection Time    11/18/13  5:50 AM      Result Value Ref Range Status   MRSA by PCR POSITIVE (*) NEGATIVE Final   Comment:            The  GeneXpert MRSA Assay (FDA     approved for NASAL specimens     only), is one component of a     comprehensive MRSA colonization     surveillance program. It is not     intended to diagnose MRSA     infection nor to guide or     monitor treatment for     MRSA infections.     RESULT CALLED TO, READ BACK BY AND VERIFIED WITH:     WASHINGTON,T RN 0710 11/18/13 MITCHELL,L  BODY FLUID CULTURE     Status: None   Collection Time    11/19/13 12:41 PM      Result Value Ref Range Status   Specimen Description PLEURAL FLUID RIGHT   Final   Special Requests 60ML FLUID   Final   Gram Stain     Final   Value: RARE WBC PRESENT, PREDOMINANTLY MONONUCLEAR     NO ORGANISMS SEEN     Performed at [facility]   Culture     Final   Value: NO GROWTH 2 DAYS     Performed at [facility]   Report Status PENDING    Incomplete     Studies: Ct Angio Chest Pe W/cm &/or Wo Cm  11/20/2013   CLINICAL DATA:  Hypoxic after thoracentesis. History of lung cancer.  EXAM: CT ANGIOGRAPHY CHEST WITH CONTRAST  TECHNIQUE: Multidetector CT imaging of the chest was performed using the standard protocol during bolus administration of intravenous contrast. Multiplanar CT image reconstructions and MIPs were obtained to evaluate the vascular anatomy.  CONTRAST:  115mL OMNIPAQUE IOHEXOL 350 MG/ML SOLN  COMPARISON:  08/29/2013 as well as chest x-ray 11/19/2013.  FINDINGS: Examination demonstrates evidence of patient's large right pleural effusion with associated compressive atelectasis. Patient's known right middle lobe lung cancer is obscured by the effusion and atelectasis. There is nodular airspace opacification over the right upper lobe likely a combination of metastatic disease and postobstructive changes. Right middle lobe bronchi are obstructed proximally likely from direct spread of tumor. There is also abrupt cut off of the right middle lobe artery likely involved by tumor thrombus. The remainder of the pulmonary arterial system is within normal. The left lung demonstrates mild linear density over the medial left lower lobe compatible with atelectasis.  Heart is normal size. There is mild calcified plaque over the coronary arteries. There is calcified plaque involving the thoracic aorta. There is a moderate size hiatal hernia unchanged. Remaining mediastinal structures are unremarkable.  Images through the upper abdomen are unchanged. There are mild degenerative changes of the spine.  Review of the MIP images confirms the above findings.  IMPRESSION: Large right pleural effusion with associated compressive atelectasis in the right base and right middle lobe. Effusion and atelectasis obscure known right middle lobe lung cancer which likely involves the right middle lobe bronchi and middle lobar pulmonary artery. Nodular airspace process  over the right upper lobe likely metastatic disease and postobstructive changes.  Linear atelectasis medial left lower lobe.  Moderate size hiatal hernia unchanged.  Minimal atherosclerotic coronary artery disease.   Electronically Signed   By: Marin Olp M.D.   On: 11/20/2013 16:10    Scheduled Meds: . atorvastatin  10 mg Oral q1800  . Chlorhexidine Gluconate Cloth  6 each Topical Q0600  . donepezil  5 mg Oral QHS  . ezetimibe  5 mg Oral Daily  . furosemide  20 mg Oral Daily  . lactose free nutrition  237  mL Oral TID WC  . levothyroxine  125 mcg Oral QAC breakfast  . mirtazapine  15 mg Oral QHS  . mupirocin ointment  1 application Nasal BID  . pantoprazole  80 mg Oral Daily  . senna-docusate  1 tablet Oral BID  . sodium chloride  3 mL Intravenous Q12H  . sodium chloride  3 mL Intravenous Q12H   Continuous Infusions:  Antibiotics Given (last 72 hours)   None      Principal Problem:   Acute respiratory failure with hypoxia Active Problems:   Malignant neoplasm of upper lobe, bronchus or lung   Anemia in neoplastic disease   Encephalopathy acute   Pleural effusion on right   Pleural effusion, right    Time spent: 25 min    Dennie Moltz, [place] Pager [phone]. If 7PM-7AM, please contact night-coverage at [email or web address], password [facility] 11/21/2013, 1:46 PM  LOS: 3 days

## 2013-11-21 NOTE — Evaluation (Signed)
Occupational Therapy Evaluation Patient Details Name: Peggy Espinoza MRN: [id number] DOB: 1929-12-20 Today's Date: 11/21/2013    History of Present Illness Pt adm with acute respiratory failure with hypoxia due to pleural effusion. Pt with recent hx of lung CA and effusion possibly malignant.   Clinical Impression   Patient evaluated by Occupational Therapy with no further acute OT needs identified. All education has been completed and the patient has no further questions. Pt currently requires mod A for BADLs due to generalized weakness and cardiopulmonary status.  She currently is confused.  She will need SNF level rehab at discharge.  02 sats 91% on 4L with HR 126. See below for any follow-up Occupational Therapy or equipment needs. OT is signing off. Thank you for this referral.      Follow Up Recommendations  SNF    Equipment Recommendations  None recommended by OT    Recommendations for Other Services       Precautions / Restrictions Precautions Precautions: Fall      Mobility Bed Mobility                  Transfers Overall transfer level: Needs assistance Equipment used: 1 person hand held assist Transfers: Sit to/from Stand Sit to Stand: Mod assist         General transfer comment: Pt with posterior bias    Balance Overall balance assessment: Needs assistance Sitting-balance support: Feet supported Sitting balance-Leahy Scale: Fair     Standing balance support: Bilateral upper extremity supported Standing balance-Leahy Scale: Poor                              ADL Overall ADL's : Needs assistance/impaired Eating/Feeding: Set up;Sitting   Grooming: Wash/dry hands;Wash/dry face;Oral care;Brushing hair;Set up;Sitting   Upper Body Bathing: Minimal assitance;Sitting   Lower Body Bathing: Moderate assistance;Sit to/from stand   Upper Body Dressing : Minimal assistance;Sitting   Lower Body Dressing: Moderate assistance;Sit to/from  stand   Toilet Transfer: Moderate assistance;Stand-pivot;BSC   Toileting- Clothing Manipulation and Hygiene: Maximal assistance;Sit to/from stand       Functional mobility during ADLs: Moderate assistance General ADL Comments: Pt self distracts during BADL activities - requires cues to stay on task.  She fatigues quickly      Vision                     Perception     Praxis      Pertinent Vitals/Pain Pain Assessment: No/denies pain     Hand Dominance Right   Extremity/Trunk Assessment Upper Extremity Assessment Upper Extremity Assessment: Generalized weakness   Lower Extremity Assessment Lower Extremity Assessment: Defer to PT evaluation   Cervical / Trunk Assessment Cervical / Trunk Assessment: Normal   Communication Communication Communication: No difficulties   Cognition Arousal/Alertness: Awake/alert Behavior During Therapy: WFL for tasks assessed/performed Overall Cognitive Status: History of cognitive impairments - at baseline                     General Comments       Exercises       Shoulder Instructions      Home Living Family/patient expects to be discharged to:: Skilled nursing facility Living Arrangements: Alone Available Help at Discharge: Family;Available 24 hours/day Type of Home: House Home Access: Stairs to enter [employer] of Steps: 5 Entrance Stairs-Rails: Can reach both Home Layout: One level  Home Equipment: Walker - standard;Cane - single point;Shower seat;Grab bars - tub/shower   Additional Comments: per granddaugther was requiring more assist with household tasks and even some personal care for several weeks.      Prior Functioning/Environment Level of Independence: Independent        Comments: Pt reports she was independent    OT Diagnosis: Generalized weakness;Cognitive deficits   OT Problem List: Decreased strength;Decreased activity tolerance;Impaired balance (sitting  and/or standing);Decreased cognition;Decreased safety awareness;Decreased knowledge of use of DME or AE;Cardiopulmonary status limiting activity   OT Treatment/Interventions: Self-care/ADL training;Therapeutic exercise;DME and/or AE instruction;Energy conservation;Therapeutic activities;Cognitive remediation/compensation;Patient/family education;Balance training    OT Goals(Current goals can be found in the care plan section) Acute Rehab OT Goals Patient Stated Goal: to go home  OT Frequency: Min 2X/week   Barriers to D/C: Decreased caregiver support          Co-evaluation              End of Session Nurse Communication: Mobility status  Activity Tolerance: Patient limited by fatigue Patient left: in chair;with call bell/phone within reach;with chair alarm set   Time: 9872-1587 OT Time Calculation (min): 10 min Charges:  OT General Charges $OT Visit: 1 Procedure OT Evaluation $Initial OT Evaluation Tier I: 1 Procedure G-Codes:    Gumaro Brightbill, Ellard Artis M November 29, 2013, 2:28 PM

## 2013-11-21 NOTE — Progress Notes (Signed)
OT Cancellation Note  Patient Details Name: NASIAH POLINSKY MRN: [id number] DOB: February 25, 1930   Cancelled Treatment:    Reason Eval/Treat Not Completed: Medical issues which prohibited therapy - results of CTA noted, spoke with MD; will defer OT at this time and initiate if pt/family decide to pursue SNF vs. Hospice.  Darlina Rumpf Eldred, OTR/L [phone]  11/21/2013, 11:58 AM

## 2013-11-21 NOTE — Clinical Social Work Note (Signed)
Clinical Social Worker continuing to follow patient and family for support and discharge planning needs.  CSW has spoken with Palliative MD and Attending MD who state that patient will likely to discharge to SNF.  CSW has submitted clinicals to [facility] for insurance authorization - await response.  CSW confirmed with Rober Minion that bed remains available.  CSW remains available for support and to facilitate patient discharge needs.  Barbette Or, [place]

## 2013-11-22 LAB — BODY FLUID CULTURE: Culture: NO GROWTH

## 2013-11-22 MED ORDER — DIAZEPAM 2 MG PO TABS: 1.0000 mg | ORAL_TABLET | Freq: Every evening | ORAL | Status: AC

## 2013-11-22 MED ORDER — IPRATROPIUM-ALBUTEROL 0.5-2.5 (3) MG/3ML IN SOLN: 3.0000 mL | RESPIRATORY_TRACT | Status: AC | PRN

## 2013-11-22 MED ORDER — ACETAMINOPHEN 325 MG PO TABS: 650.0000 mg | ORAL_TABLET | Freq: Three times a day (TID) | ORAL | Status: AC

## 2013-11-22 MED ORDER — DIAZEPAM 2 MG PO TABS
2.0000 mg | ORAL_TABLET | Freq: Once | ORAL | Status: DC
  Filled 2013-11-22: qty 1

## 2013-11-22 MED ORDER — MORPHINE SULFATE (CONCENTRATE) 10 MG /0.5 ML PO SOLN: 10.0000 mg | ORAL | Status: AC | PRN

## 2013-11-22 MED ORDER — MORPHINE SULFATE (CONCENTRATE) 10 MG /0.5 ML PO SOLN: 10.0000 mg | ORAL | Status: DC | PRN

## 2013-11-22 MED ORDER — ACETAMINOPHEN 325 MG PO TABS: 650.0000 mg | ORAL_TABLET | Freq: Three times a day (TID) | ORAL | Status: DC

## 2013-11-22 NOTE — Discharge Summary (Addendum)
Physician Discharge Summary  Peggy Espinoza DTO:[id number] DOB: 18-Oct-1929 DOA: 11/18/2013  PCP: Leonides Grills, MD  Admit date: 11/18/2013 Discharge date: 11/22/2013  Time spent: Less than 30 minutes  Recommendations for Outpatient Follow-up:  1. Recommend palliative care consultation and followup at SNF. 2. Oxygen via nasal cannula at 4 L per minute continuously and titrate to comfort. 3. MD at SNF  Discharge Diagnoses:  Principal Problem:   Acute respiratory failure with hypoxia Active Problems:   Non-small cell carcinoma of lung   Anemia in neoplastic disease   Encephalopathy acute   Pleural effusion, right   Dementia   DNR (do not resuscitate)   Postobstructive pneumonia   Discharge Condition: Improved & Stable  Diet recommendation: Regular diet  Filed Weights   11/20/13 0300 11/21/13 0556 11/22/13 0737  Weight: 66.679 kg (147 lb) 69.264 kg (152 lb 11.2 oz) 65.5 kg (144 lb 6.4 oz)    History of present illness:  80 yo female with h/o lung cancer dx last year, mild dementia lives alone comes in with daughter having noted to be more confused than normal. Pt denies any pain, no sob, no cough. No swelling in her legs. Appetite is good and no wt loss. She completed her xrt for her lung cancer, but only tolerated one dose of chemo and has not been on anything in over 6 months. dtr reports to be that "her cancer is cured", and that is what family has been telling the patient for over 6 months. On arrival to ED, pt was hypoxic in the 70s. Has no oxygen requirements at home.    Hospital Course:   1. Acute respiratory failure with hypoxia: Most likely secondary to malignant pleural effusion. Status post thoracentesis with minimal improvement. Palliative care was consulted and patient has been transitioned to comfort care. 2. Lung cancer with malignant right pleural effusion: Management as above. 3. Anemia: Secondary to chronic disease. 4. Sundowning/p.m. delirium/intermittent  agitation: palliative care M.D. Has seen the patient today and recommended low dose Valium at around 8 PM daily without when necessary's. 5. Arthritic pain: Scheduled 3 times a day Tylenol. 6. Dyspnea: Continue when necessary nebulizations. Continue Roxanol for acute onset of dyspnea or if she has rapid airway obstruction from tumor hemorrhage or airway obstruction. 7. [place]ia  Consultations:  Palliative care medicine  Interventional radiology  Procedures:  Ultrasound-guided thoracentesis    Discharge Exam:  Complaints:  Pleasantly confused. No history available from patient.  Filed Vitals:   11/21/13 1952 11/22/13 0726 11/22/13 0737 11/22/13 1320  BP: 97/65 113/68  118/78  Pulse: 118 118  122  Temp: 98.4 F (36.9 C) 98.4 F (36.9 C)  97.8 F (36.6 C)  TempSrc: Oral Oral  Oral  Resp: 18 18  18   Height:      Weight:   65.5 kg (144 lb 6.4 oz)   SpO2: 98% 93%  97%    General exam: Pleasant elderly female lying comfortably in bed. Respiratory system: Reduced breath sounds bilaterally, right > left. No increased work of breathing. Cardiovascular system: S1 & S2 heard, RRR. No JVD, murmurs, gallops, clicks or pedal edema. Gastrointestinal system: Abdomen is nondistended, soft and nontender. Normal bowel sounds heard. Central nervous system: Alert and oriented only to self. No focal neurological deficits. Extremities: Symmetric 5 x 5 power.  Discharge Instructions      Discharge Instructions   Call MD for:  difficulty breathing, headache or visual disturbances    Complete by:  As directed  Call MD for:  severe uncontrolled pain    Complete by:  As directed      Diet general    Complete by:  As directed      Increase activity slowly    Complete by:  As directed             Medication List    STOP taking these medications       ESTER-C Tabs     ezetimibe 10 MG tablet  Commonly known as:  ZETIA     ferrous sulfate 325 (65 FE) MG tablet      fluticasone 50 MCG/ACT nasal spray  Commonly known as:  FLONASE     metFORMIN 500 MG tablet  Commonly known as:  GLUCOPHAGE     multivitamin tablet     naproxen sodium 220 MG tablet  Commonly known as:  ANAPROX     olmesartan-hydrochlorothiazide 20-12.5 MG per tablet  Commonly known as:  BENICAR HCT     rosuvastatin 5 MG tablet  Commonly known as:  CRESTOR     Vitamin D 2000 UNITS tablet      TAKE these medications       acetaminophen 325 MG tablet  Commonly known as:  TYLENOL  Take 2 tablets (650 mg total) by mouth 3 (three) times daily.     diazepam 2 MG tablet  Commonly known as:  VALIUM  Take 0.5 tablets (1 mg total) by mouth every evening. At 8 pm daily.     donepezil 5 MG tablet  Commonly known as:  ARICEPT  Take 5 mg by mouth at bedtime.     esomeprazole 40 MG capsule  Commonly known as:  NEXIUM  Take 40 mg by mouth daily before breakfast.     furosemide 20 MG tablet  Commonly known as:  LASIX  Take 20 mg by mouth daily.     ipratropium-albuterol 0.5-2.5 (3) MG/3ML Soln  Commonly known as:  DUONEB  Take 3 mLs by nebulization every 4 (four) hours as needed (Dyspnea or wheezing.).     levothyroxine 125 MCG tablet  Commonly known as:  SYNTHROID, LEVOTHROID  Take 125 mcg by mouth daily before breakfast.     morphine CONCENTRATE 10 mg / 0.5 ml concentrated solution  Take 0.5 mLs (10 mg total) by mouth every 2 (two) hours as needed for moderate pain, severe pain, anxiety or shortness of breath.          The results of significant diagnostics from this hospitalization (including imaging, microbiology, ancillary and laboratory) are listed below for reference.    Significant Diagnostic Studies: Dg Chest 1 View  11/19/2013   CLINICAL DATA:  Post thoracentesis  EXAM: CHEST - 1 VIEW  COMPARISON:  11/18/2013  FINDINGS: Enlargement of cardiac silhouette.  Large RIGHT pleural effusion, slightly decreased in the previous exam post thoracentesis.  No definite  pneumothorax.  Minimal atelectasis at LEFT base.  LEFT lung otherwise clear.  Bones demineralized.  IMPRESSION: No pneumothorax following RIGHT thoracentesis.  Persistent large RIGHT pleural effusion though decreased from previous exam.   Electronically Signed   By: Lavonia Dana M.D.   On: 11/19/2013 12:58   Ct Angio Chest Pe W/cm &/or Wo Cm  11/20/2013   CLINICAL DATA:  Hypoxic after thoracentesis. History of lung cancer.  EXAM: CT ANGIOGRAPHY CHEST WITH CONTRAST  TECHNIQUE: Multidetector CT imaging of the chest was performed using the standard protocol during bolus administration of intravenous contrast. Multiplanar CT image reconstructions and MIPs  were obtained to evaluate the vascular anatomy.  CONTRAST:  157mL OMNIPAQUE IOHEXOL 350 MG/ML SOLN  COMPARISON:  08/29/2013 as well as chest x-ray 11/19/2013.  FINDINGS: Examination demonstrates evidence of patient's large right pleural effusion with associated compressive atelectasis. Patient's known right middle lobe lung cancer is obscured by the effusion and atelectasis. There is nodular airspace opacification over the right upper lobe likely a combination of metastatic disease and postobstructive changes. Right middle lobe bronchi are obstructed proximally likely from direct spread of tumor. There is also abrupt cut off of the right middle lobe artery likely involved by tumor thrombus. The remainder of the pulmonary arterial system is within normal. The left lung demonstrates mild linear density over the medial left lower lobe compatible with atelectasis.  Heart is normal size. There is mild calcified plaque over the coronary arteries. There is calcified plaque involving the thoracic aorta. There is a moderate size hiatal hernia unchanged. Remaining mediastinal structures are unremarkable.  Images through the upper abdomen are unchanged. There are mild degenerative changes of the spine.  Review of the MIP images confirms the above findings.  IMPRESSION: Large  right pleural effusion with associated compressive atelectasis in the right base and right middle lobe. Effusion and atelectasis obscure known right middle lobe lung cancer which likely involves the right middle lobe bronchi and middle lobar pulmonary artery. Nodular airspace process over the right upper lobe likely metastatic disease and postobstructive changes.  Linear atelectasis medial left lower lobe.  Moderate size hiatal hernia unchanged.  Minimal atherosclerotic coronary artery disease.   Electronically Signed   By: Marin Olp M.D.   On: 11/20/2013 16:10   Dg Chest Port 1 View  11/18/2013   CLINICAL DATA:  80 year old female with cough congestion shortness of breath and right lung cancer. Initial encounter.  EXAM: PORTABLE CHEST - 1 VIEW  COMPARISON:  08/29/2013 chest CT and earlier.  FINDINGS: Portable AP upright view at 0033 hrs. There is new subtotal opacification of the right hemi thorax, which most resembles a large pleural effusion. This obscures the underlying known right lung mass. The left lung appears stable. Visualized tracheal air column is within normal limits.  IMPRESSION: New large right pleural effusion, obscuring the known right lung mass.   Electronically Signed   By: Lars Pinks M.D.   On: 11/18/2013 01:19   US Thoracentesis Asp Pleural Space W/img Guide  11/19/2013   CLINICAL DATA:  Lung cancer. Right-sided pleural effusion. Request diagnostic and therapeutic thoracentesis.  EXAM: ULTRASOUND GUIDED RIGHT THORACENTESIS  COMPARISON:  None.  PROCEDURE: An ultrasound guided thoracentesis was thoroughly discussed with the patient and questions answered. The benefits, risks, alternatives and complications were also discussed. The patient understands and wishes to proceed with the procedure. Written consent was obtained.  Ultrasound was performed to localize and mark an adequate pocket of fluid in the right chest. The area was then prepped and draped in the normal sterile fashion. 1%  Lidocaine was used for local anesthesia. Under ultrasound guidance a 19 gauge Yueh catheter was introduced. Thoracentesis was performed. The catheter was removed and a dressing applied.  Complications: Patient experienced cough and discomfort. The procedure was stopped at this point.  FINDINGS: A total of approximately 1.2 L of clear yellow fluid was removed. A fluid sample well assent for laboratory analysis.  IMPRESSION: Successful ultrasound guided right thoracentesis yielding 1.2 L of pleural fluid.  Read by: Ascencion Dike PA-C   Electronically Signed   By: Eldridge Abrahams.D.  On: 11/19/2013 12:42    Microbiology: Recent Results (from the past 240 hour(s))  MRSA PCR SCREENING     Status: Abnormal   Collection Time    11/18/13  5:50 AM      Result Value Ref Range Status   MRSA by PCR POSITIVE (*) NEGATIVE Final   Comment:            The GeneXpert MRSA Assay (FDA     approved for NASAL specimens     only), is one component of a     comprehensive MRSA colonization     surveillance program. It is not     intended to diagnose MRSA     infection nor to guide or     monitor treatment for     MRSA infections.     RESULT CALLED TO, READ BACK BY AND VERIFIED WITH:     WASHINGTON,T RN [id number] 11/18/13 MITCHELL,L  BODY FLUID CULTURE     Status: None   Collection Time    11/19/13 12:41 PM      Result Value Ref Range Status   Specimen Description PLEURAL FLUID RIGHT   Final   Special Requests 60ML FLUID   Final   Gram Stain     Final   Value: RARE WBC PRESENT, PREDOMINANTLY MONONUCLEAR     NO ORGANISMS SEEN     Performed at [facility]   Culture     Final   Value: NO GROWTH 3 DAYS     Performed at [facility]   Report Status 11/22/2013 FINAL   Final     Labs: Basic Metabolic Panel:  Recent Labs Lab 11/18/13 0045  NA 134*  K 4.3  CL 86*  CO2 37*  GLUCOSE 132*  BUN 13  CREATININE 0.58  CALCIUM 9.6   Liver Function Tests:  Recent Labs Lab 11/18/13 0045  AST  20  ALT 12  ALKPHOS 121*  BILITOT 0.3  PROT 7.9  ALBUMIN 3.7   No results found for this basename: LIPASE, AMYLASE,  in the last 168 hours No results found for this basename: AMMONIA,  in the last 168 hours CBC:  Recent Labs Lab 11/18/13 0045  WBC 6.7  NEUTROABS 5.1  HGB 11.0*  HCT 33.8*  MCV 89.2  PLT 393   Cardiac Enzymes:  Recent Labs Lab 11/18/13 0045  TROPONINI <0.30   BNP: BNP (last 3 results)  Recent Labs  11/18/13 0045  PROBNP 289.1   CBG:  Recent Labs Lab 11/18/13 0545 11/18/13 1157 11/18/13 1650 11/20/13 2332  GLUCAP 152* 160* 157* 169*    Left message for patient's daughter Ms. Audree Camel.   Signed:  Vernell Leep, MD, FACP, FHM. [facility] Pager [phone]  If 7PM-7AM, please contact night-coverage [email or web address] Password [email or web address] 11/22/2013, 1:29 PM

## 2013-11-22 NOTE — Progress Notes (Signed)
Palliative Medicine Team Progress Note  Ms. Liston is slightly more agitated this AM. She is ready to get OOB. Her son in law is at bedside and tells me she is really confused, asking me if there is something I can give her to calm down. She was anxiouis to get OOB, and had to use the bedside commode. I spoke with her about rehab as well as with her SIL. Plan is for her to go to [facility] and if she declines to get hospice involved in her care. Will complete MOST and leave in the chart. Her daughter will need to sign this form when she arrives to the hospital.  Symptom Management:  1. Sundowning/PM delirium/intermittent agitation: Mobilize as tolerated, give a SCHEDULED low dose of valium around 8PM for sleep and agitation- avoid PRN dosing as she may not get this at SNF and usually with PRN they are given very late in evening after symptoms are much worse which causes daytime sedation.  2. Pain, arthritis: Scheduled TID Tylenol-she has low level chronic pain- again avoid PRN dosing when possible at SNF.  3. Dyspnea: She should continue her nebulizer treatments. I have ordered PRN Roxanol for acute onset dyspnea or if she has rapid airway obstruction from tumor hemorrhage or airway obstruction- should be used for significant distress or decline.  Prognosis is <6 months, she is very hospice appropriate, but will elect to use her medicare rehab benefit for SNF level care.   Stable for discharge to SNF, please request a referral to Palliative Care Services at discharge with either [facility] or [facility].  Lane Hacker, DO Palliative Medicine  Time: 10AM-10:25AM 25 minutes Greater than 50%  of this time was spent counseling and coordinating care related to the above assessment and plan.

## 2013-11-22 NOTE — Clinical Social Work Note (Signed)
Clinical Social Worker facilitated patient discharge including contacting patient family and facility to confirm patient discharge plans.  Clinical information faxed to facility and family agreeable with plan.  Insurance authorization received [id number]).  CSW arranged ambulance transport via [facility] to [facility].  Ambulance authorization received ([id number]).  RN to call report prior to discharge.  Clinical Social Worker will sign off for now as social work intervention is no longer needed. Please consult [place] again if new need arises.  Barbette Or, [place]

## 2013-11-23 ENCOUNTER — Non-Acute Institutional Stay (SKILLED_NURSING_FACILITY): Payer: Medicare Other | Admitting: Internal Medicine

## 2013-11-23 DIAGNOSIS — J90 Pleural effusion, not elsewhere classified: Secondary | ICD-10-CM

## 2013-11-23 DIAGNOSIS — C3490 Malignant neoplasm of unspecified part of unspecified bronchus or lung: Secondary | ICD-10-CM

## 2013-11-23 DIAGNOSIS — M170 Bilateral primary osteoarthritis of knee: Secondary | ICD-10-CM

## 2013-11-23 DIAGNOSIS — J9601 Acute respiratory failure with hypoxia: Secondary | ICD-10-CM

## 2013-11-23 DIAGNOSIS — F0390 Unspecified dementia without behavioral disturbance: Secondary | ICD-10-CM

## 2013-11-23 DIAGNOSIS — D630 Anemia in neoplastic disease: Secondary | ICD-10-CM

## 2013-11-23 DIAGNOSIS — M1710 Unilateral primary osteoarthritis, unspecified knee: Secondary | ICD-10-CM

## 2013-11-23 DIAGNOSIS — C3491 Malignant neoplasm of unspecified part of right bronchus or lung: Secondary | ICD-10-CM

## 2013-11-23 DIAGNOSIS — J9600 Acute respiratory failure, unspecified whether with hypoxia or hypercapnia: Secondary | ICD-10-CM

## 2013-11-23 NOTE — Progress Notes (Signed)
MRN: [id number] Name: Peggy Espinoza  Sex: female Age: 80 y.o. DOB: 1929/10/23  [place] #: [facility] Facility/Room: 106 Level Of Care: SNF Provider: Inocencio Homes D Emergency Contacts: Extended Emergency Contact Information Primary Emergency Contact: Marcy Salvo, [place] [address] [place] Phone: [phone] Mobile Phone: [phone] Relation: Daughter Secondary Emergency Contact: Whitney Post [place] Phone: [phone] Relation: Relative  Code Status: DNR  Allergies: Bactrim; Ciprofloxacin; Penicillins; Welchol; and Zocor  Chief Complaint  Patient presents with  . New Admit To SNF    HPI: Patient is 80 y.o. female who is admitted to SNF s/p resp failure 2/2 malignant pleural effusion.Pt is Pallative care - the word hospice scares her.  Past Medical History  Diagnosis Date  . HTN (hypertension)   . Hyperlipidemia   . Thyroid disease   . Diabetes mellitus   . Anxiety   . History of stress test 11/2012    [facility]   . Hypothyroidism   . Dementia     pt. states daughter got her started on Aricept   . GERD (gastroesophageal reflux disease)   . Arthritis     knees, legs, back  . Bladder incontinence     wears pad always   . Lung cancer   . History of radiation therapy 01/08/13-04/12/13    70Gy total  . Hx of radiation therapy   . Shoulder pain 01/26/2011  . Shoulder bursitis 10/26/2012  . PNA (pneumonia) 01/18/2013  . Pleural effusion on right 11/18/2013  . Malignant neoplasm of upper lobe, bronchus or lung 01/01/2013  . Joint pain 08/01/2012  . Disorders of bursae and tendons in shoulder region, unspecified 01/09/2013  . COLLES' FRACTURE, RIGHT WRIST 12/03/2008    Qualifier: Diagnosis of  By: Aline Brochure MD, Dorothyann Peng    . Benign paroxysmal positional vertigo 02/09/2013  . Anemia 01/18/2013    Past Surgical History  Procedure Laterality Date  . Arm fracture surgery    . Tonsillectomy     . Appendectomy    . Abdominal hysterectomy    . Lipoma removal    . Eye surgery    . Cholecystectomy    . Cataract extraction    . Fracture surgery      plate in R hand - long time ago  . Carpal tunnel release      R arm  . Video bronchoscopy N/A 12/15/2012    Procedure: VIDEO BRONCHOSCOPY;  Surgeon: Melrose Nakayama, MD;  Location: [facility];  Service: Thoracic;  Laterality: N/A;      Medication List       This list is accurate as of: 11/23/13 11:59 PM.  Always use your most recent med list.               acetaminophen 325 MG tablet  Commonly known as:  TYLENOL  Take 2 tablets (650 mg total) by mouth 3 (three) times daily.     diazepam 2 MG tablet  Commonly known as:  VALIUM  Take 0.5 tablets (1 mg total) by mouth every evening. At 8 pm daily.     donepezil 5 MG tablet  Commonly known as:  ARICEPT  Take 5 mg by mouth at bedtime.     esomeprazole 40 MG capsule  Commonly known as:  NEXIUM  Take 40 mg by mouth daily before breakfast.     furosemide 20 MG tablet  Commonly  known as:  LASIX  Take 20 mg by mouth daily.     ipratropium-albuterol 0.5-2.5 (3) MG/3ML Soln  Commonly known as:  DUONEB  Take 3 mLs by nebulization every 4 (four) hours as needed (Dyspnea or wheezing.).     levothyroxine 125 MCG tablet  Commonly known as:  SYNTHROID, LEVOTHROID  Take 125 mcg by mouth daily before breakfast.     morphine CONCENTRATE 10 mg / 0.5 ml concentrated solution  Take 0.5 mLs (10 mg total) by mouth every 2 (two) hours as needed for moderate pain, severe pain, anxiety or shortness of breath.        No orders of the defined types were placed in this encounter.     There is no immunization history on file for this patient.  History  Substance Use Topics  . Smoking status: Former Smoker -- 1.00 packs/day for 30 years    Types: Cigarettes    Start date: 03/29/1985  . Smokeless tobacco: Never Used  . Alcohol Use: No    Family history is noncontributory     Review of Systems  DATA OBTAINED: from patient, daughter GENERAL: Feels well no fevers, fatigue, appetite changes SKIN: No itching, rash or wounds EYES: No eye pain, redness, discharge EARS: No earache, tinnitus, change in hearing NOSE: No congestion, drainage or bleeding  MOUTH/THROAT: No mouth or tooth pain, No sore throat RESPIRATORY: No cough, wheezing, SOB CARDIAC: No chest pain, palpitations, lower extremity edema  GI: No abdominal pain, No N/V/D or constipation, No heartburn or reflux  GU: No dysuria, frequency or urgency, or incontinence  MUSCULOSKELETAL: knee pai  For which she takes aleve NEUROLOGIC: No headache, dizziness or focal weakness PSYCHIATRIC: No overt anxiety or sadness. Sleeps well; she thinks HOSPICE means death occuring very soon so prefer to see pallative.   Filed Vitals:   11/23/13 1251  BP: 117/76  Pulse: 107  Temp: 98.1 F (36.7 C)  Resp: 18    Physical Exam  GENERAL APPEARANCE: Alert, conversant. Appropriately groomed. No acute distress.  SKIN: No diaphoresis rash HEAD: Normocephalic, atraumatic  EYES: Conjunctiva/lids clear. Pupils round, reactive. EOMs intact.  EARS: External exam WNL, canals clear. Hearing grossly normal.  NOSE: No deformity or discharge.  MOUTH/THROAT: Lips w/o lesions  RESPIRATORY: Breathing is even, unlabored. Lung sounds are clear   CARDIOVASCULAR: Heart RRR no murmurs, rubs or gallops. trace peripheral edema.   GASTROINTESTINAL: Abdomen is soft, non-tender, not distended w/ normal bowel sounds GENITOURINARY: Bladder non tender, not distended  MUSCULOSKELETAL: No abnormal joints or musculature NEUROLOGIC: Cranial nerves 2-12 grossly intact. Moves all extremities no tremor. PSYCHIATRIC: Mood and affect appropriate to situation, no behavioral issues  Patient Active Problem List   Diagnosis Date Noted  . Dementia 11/21/2013  . DNR (do not resuscitate) 11/21/2013  . Postobstructive pneumonia 11/21/2013  .  Encephalopathy acute 11/18/2013  . Acute respiratory failure with hypoxia 11/18/2013  . Pleural effusion, right 11/18/2013  . Hyperglycemia 02/09/2013  . Anemia in neoplastic disease 02/09/2013  . Hypomagnesemia 01/19/2013  . Hypokalemia 01/18/2013  . Non-small cell carcinoma of lung 12/28/2012  . OA (osteoarthritis) of knee 10/06/2011  . Rotator cuff tear 04/07/2011  . KNEE, ARTHRITIS, DEGEN./OSTEO 06/27/2007    CBC    Component Value Date/Time   WBC 6.7 11/18/2013 0045   WBC 7.1 01/08/2013 1059   RBC 3.79* 11/18/2013 0045   RBC 3.79 01/08/2013 1059   HGB 11.0* 11/18/2013 0045   HGB 10.7* 01/08/2013 1059   HCT 33.8* 11/18/2013  0045   HCT 33.2* 01/08/2013 1059   PLT 393 11/18/2013 0045   PLT 347 01/08/2013 1059   MCV 89.2 11/18/2013 0045   MCV 87.6 01/08/2013 1059   LYMPHSABS 0.9 11/18/2013 0045   LYMPHSABS 2.1 01/08/2013 1059   MONOABS 0.6 11/18/2013 0045   MONOABS 0.5 01/08/2013 1059   EOSABS 0.1 11/18/2013 0045   EOSABS 0.1 01/08/2013 1059   BASOSABS 0.0 11/18/2013 0045   BASOSABS 0.0 01/08/2013 1059    CMP     Component Value Date/Time   NA 134* 11/18/2013 0045   NA 140 01/08/2013 1100   K 4.3 11/18/2013 0045   K 4.9 01/08/2013 1100   CL 86* 11/18/2013 0045   CO2 37* 11/18/2013 0045   CO2 31* 01/08/2013 1100   GLUCOSE 132* 11/18/2013 0045   GLUCOSE 131 01/08/2013 1100   BUN 13 11/18/2013 0045   BUN 13.2 01/08/2013 1100   CREATININE 0.58 11/18/2013 0045   CREATININE 0.7 01/08/2013 1100   CALCIUM 9.6 11/18/2013 0045   CALCIUM 10.1 01/08/2013 1100   PROT 7.9 11/18/2013 0045   PROT 7.8 01/08/2013 1100   ALBUMIN 3.7 11/18/2013 0045   ALBUMIN 3.3* 01/08/2013 1100   AST 20 11/18/2013 0045   AST 16 01/08/2013 1100   ALT 12 11/18/2013 0045   ALT 11 01/08/2013 1100   ALKPHOS 121* 11/18/2013 0045   ALKPHOS 120 01/08/2013 1100   BILITOT 0.3 11/18/2013 0045   BILITOT 0.29 01/08/2013 1100   GFRNONAA 84* 11/18/2013 0045   GFRAA >90 11/18/2013 0045    Assessment and Plan  Acute  respiratory failure with hypoxia 2/2 MALIGNANT EFFUSION, s/p thoracentesis  Pleural effusion, right S/p thoracentesis  Non-small cell carcinoma of lung Recurrent -pt seen by pallative care in hospital and transitioned to comfort care; daughter not sure why some meds were kept and some stopped and she had many questions which I answered  Dementia pt kept on aricept to prevent possible decompensation; per daughter valium used for sundowning is making her sleep all the time; i halved the dose, then it wasn't available so changed to klonopin 0.5 mg qHS  OA (osteoarthritis) of knee Pt has pain daily and does very well with aleve 220 mg 2 po q am per daughter so I restarted her for comfort  Anemia in neoplastic disease D/c Hb 11    Hennie Duos, MD

## 2013-11-24 ENCOUNTER — Encounter: Payer: Self-pay | Admitting: Internal Medicine

## 2013-11-24 NOTE — Assessment & Plan Note (Signed)
S/p thoracentesis

## 2013-11-24 NOTE — Assessment & Plan Note (Signed)
Recurrent -pt seen by pallative care in hospital and transitioned to comfort care; daughter not sure why some meds were kept and some stopped and she had many questions which I answered

## 2013-11-24 NOTE — Assessment & Plan Note (Signed)
2/2 MALIGNANT EFFUSION, s/p thoracentesis

## 2013-11-24 NOTE — Assessment & Plan Note (Signed)
D/c Hb 11

## 2013-11-24 NOTE — Assessment & Plan Note (Addendum)
pt kept on aricept to prevent possible decompensation; per daughter valium used for sundowning is making her sleep all the time; i halved the dose, then it wasn't available so changed to klonopin 0.5 mg qHS

## 2013-11-24 NOTE — Assessment & Plan Note (Signed)
Pt has pain daily and does very well with aleve 220 mg 2 po q am per daughter so I restarted her for comfort

## 2013-11-26 ENCOUNTER — Other Ambulatory Visit: Payer: Self-pay | Admitting: *Deleted

## 2013-11-26 MED ORDER — CLONAZEPAM 0.5 MG PO TABS: ORAL_TABLET | ORAL | Status: AC

## 2013-11-26 NOTE — Telephone Encounter (Signed)
[place]

## 2014-01-03 ENCOUNTER — Ambulatory Visit: Payer: Medicare Other

## 2014-01-03 ENCOUNTER — Ambulatory Visit (HOSPITAL_COMMUNITY): Payer: Medicare Other

## 2014-01-10 ENCOUNTER — Ambulatory Visit: Payer: Medicare Other | Admitting: Radiation Oncology

## 2014-01-27 DEATH — deceased

## 2014-03-18 IMAGING — CR DG CHEST 1V PORT
1 series · 1 of 1 positions shown · non-contrast
Comparison: [DATE]/[id number]

CLINICAL DATA: Status post central line placement

EXAM:
PORTABLE CHEST - 1 VIEW

[AP]
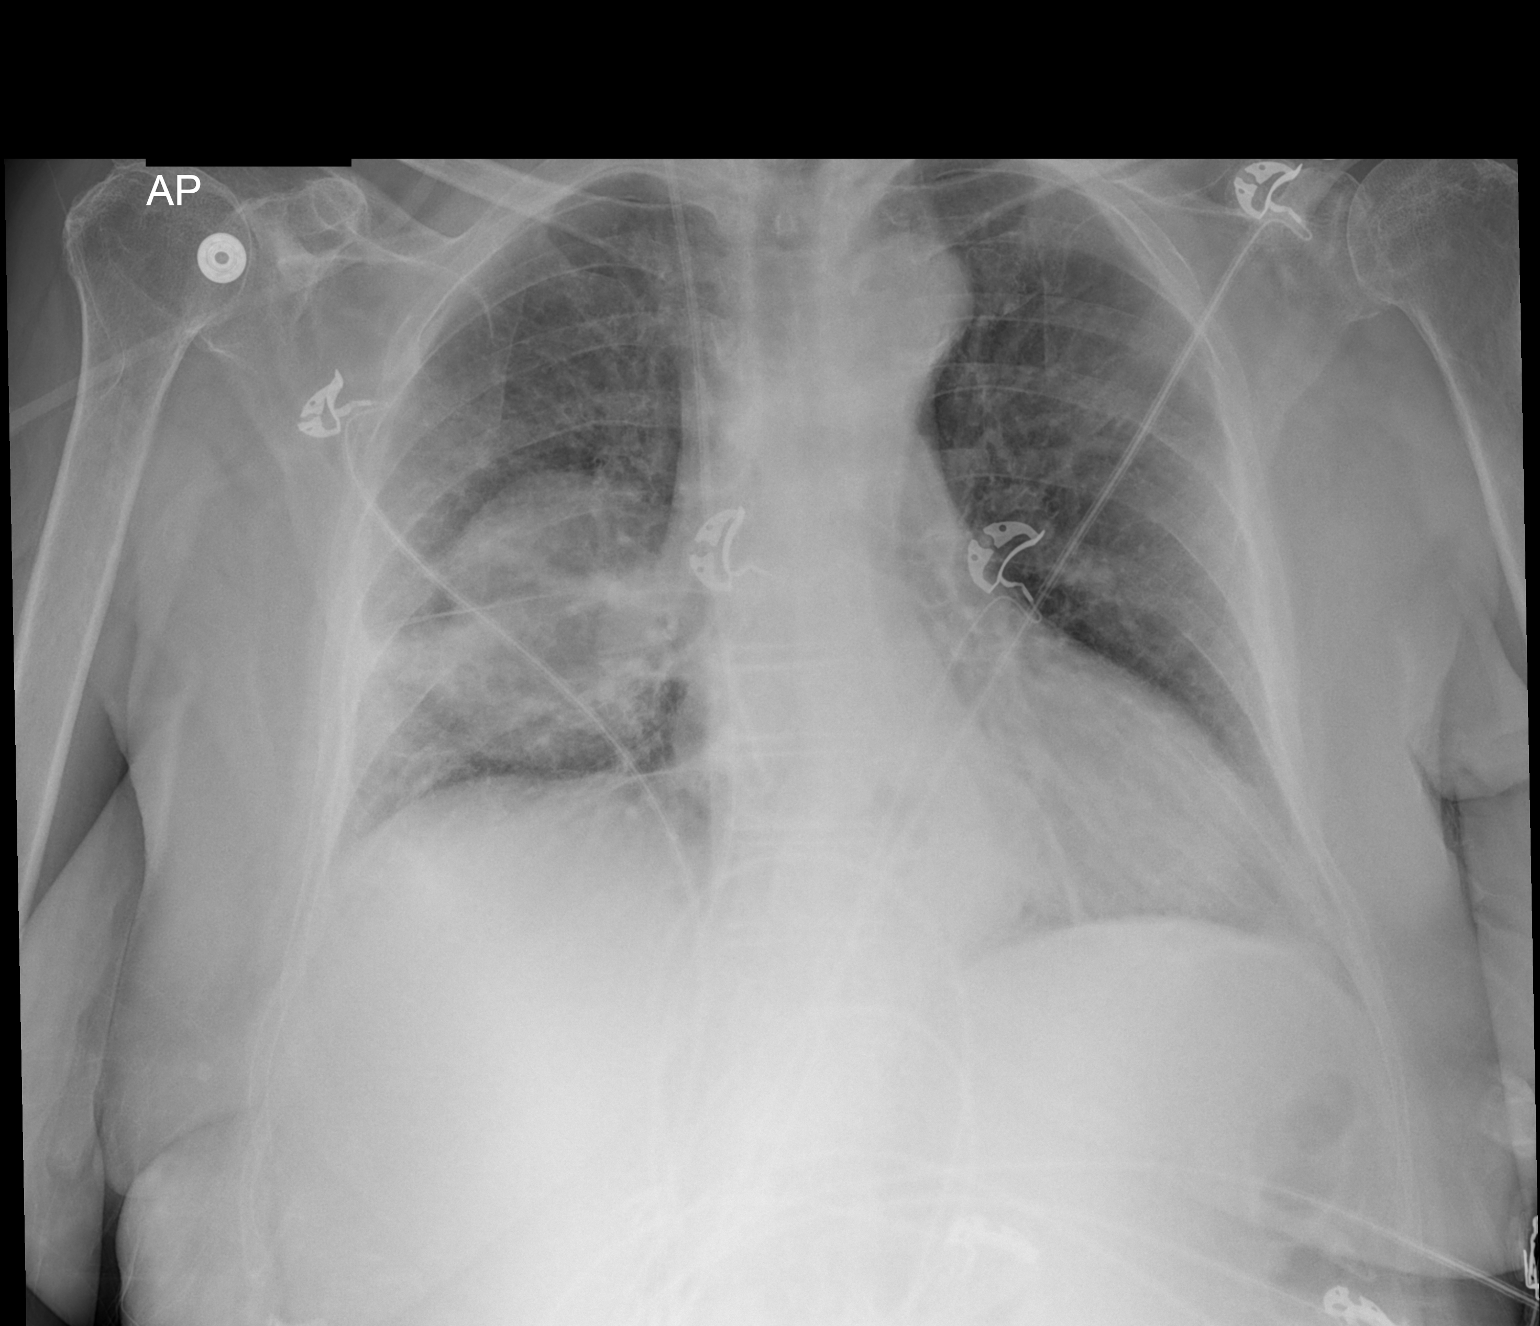

[1 of 1 positions shown; findings below may reference images not displayed]

FINDINGS: The right jugular line has now been placed. The catheter tip is
noted in the rib mid right atrium. No pneumothorax is seen. A
persistent right-sided lung mass is seen. No new focal abnormality
is noted.
IMPRESSION: No evidence of pneumothorax following central line placement.

## 2014-10-30 IMAGING — CT CT CHEST W/ CM
2 of 4 series · 15 of 36 positions shown, 18 images · IV contrast (omnipaque)
Comparison: Chest CT from 05/25/2013 and 11/01/2012. PET-CT from
11/16/2012.

CLINICAL DATA: Lung cancer.

EXAM:
CT CHEST WITH CONTRAST
TECHNIQUE: Multidetector CT imaging of the chest was performed during
intravenous contrast administration.
CONTRAST:  80mL OMNIPAQUE IOHEXOL 300 MG/ML  SOLN

[Series 2: chest with st · axial · 0.64mm/px · z∈[-404,-164]mm · 12 of 58 slices shown, 15 images]
[im 5/58  mediastinal]
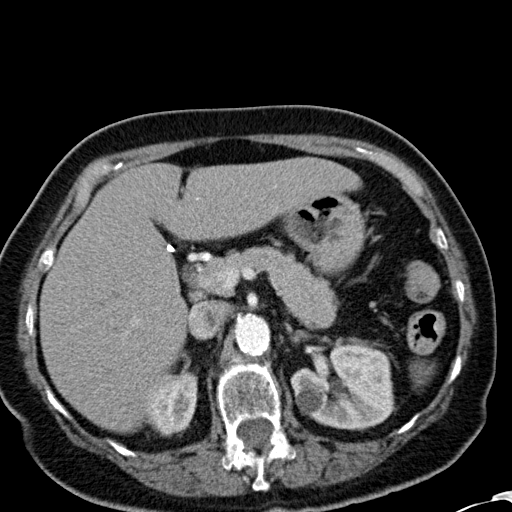
[im 5/58  lung]
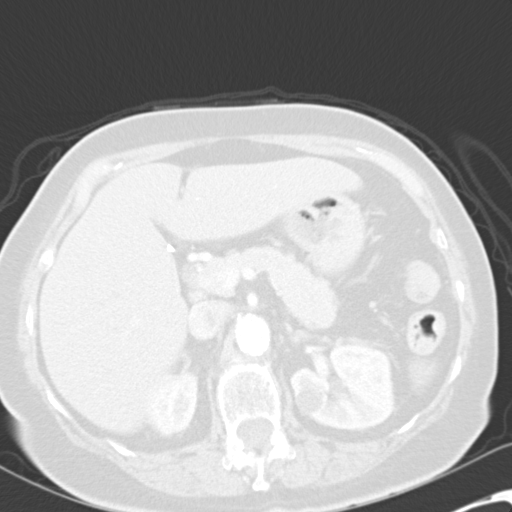
[im 9/58  lung]
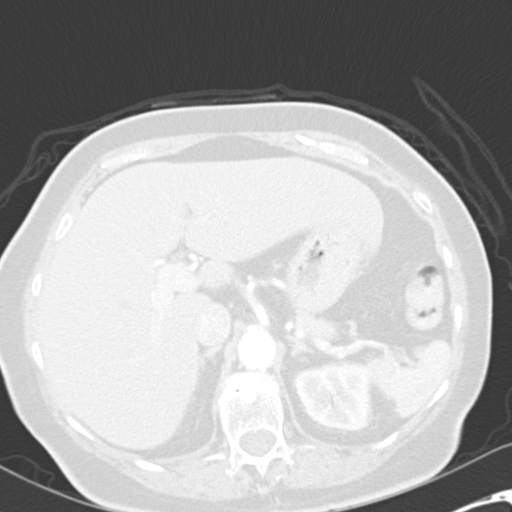
[im 13/58  lung]
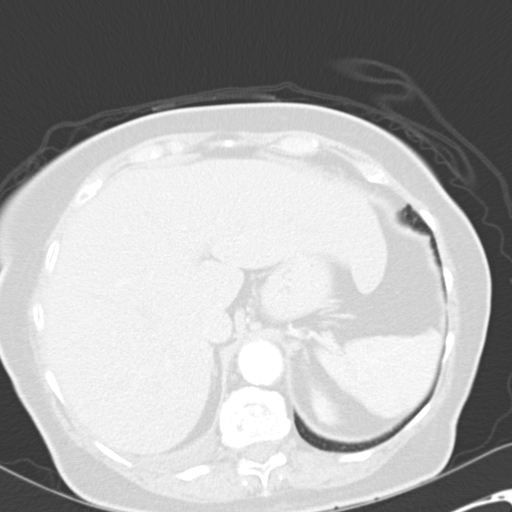
[im 17/58  lung]
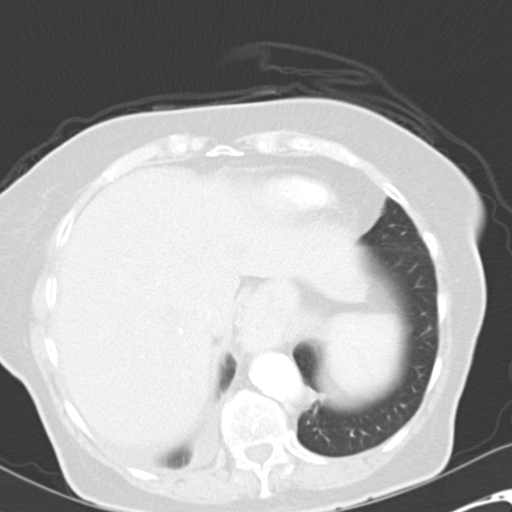
[im 21/58  mediastinal]
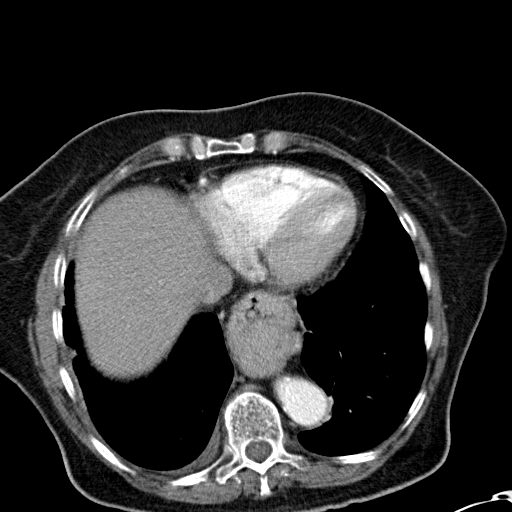
[im 21/58  lung]
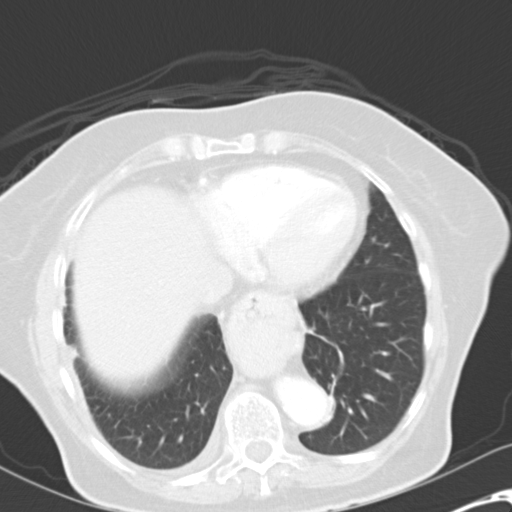
[im 25/58  lung]
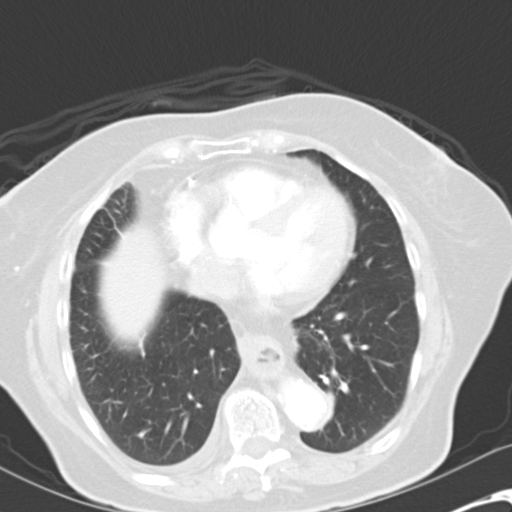
[im 33/58  lung]
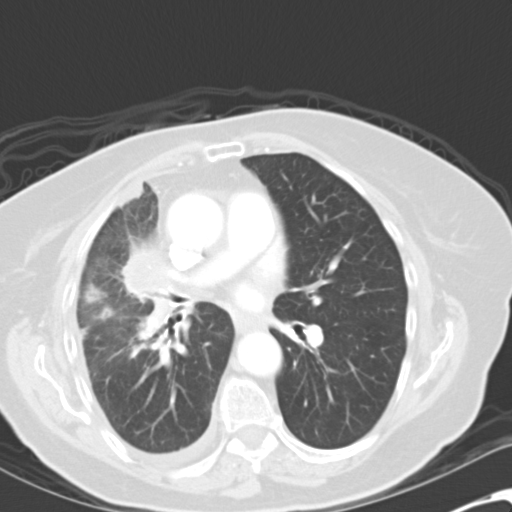
[im 37/58  lung]
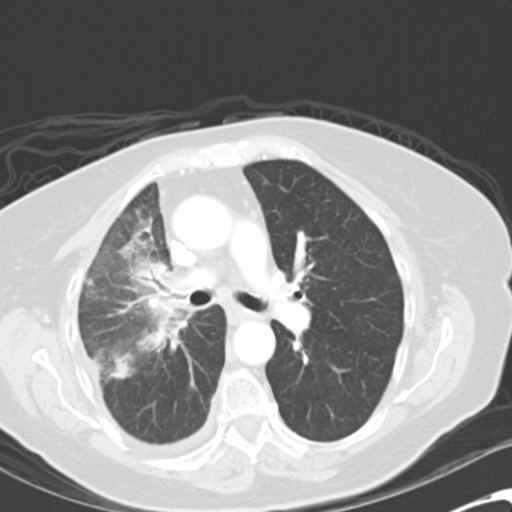
[im 41/58  mediastinal]
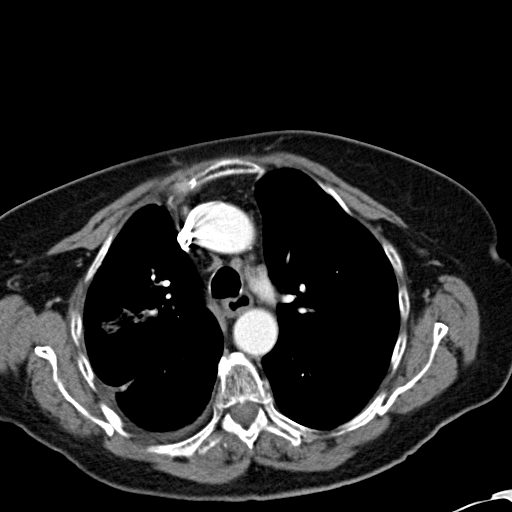
[im 41/58  lung]
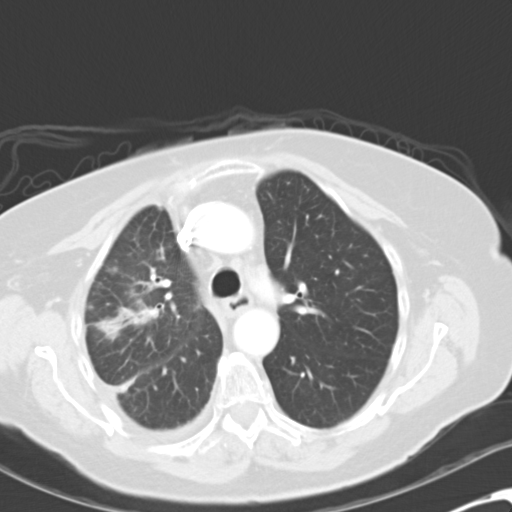
[im 45/58  lung]
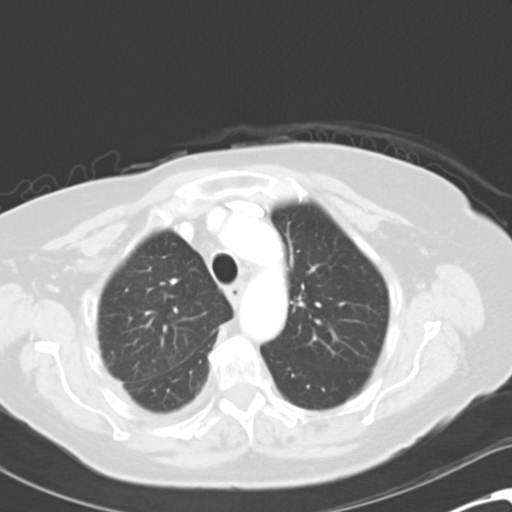
[im 49/58  lung]
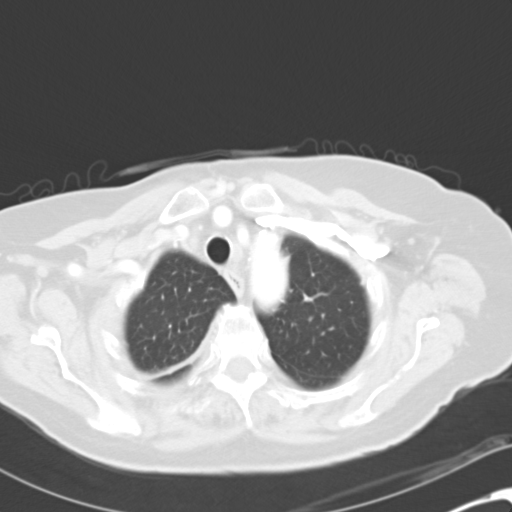
[im 53/58  lung]
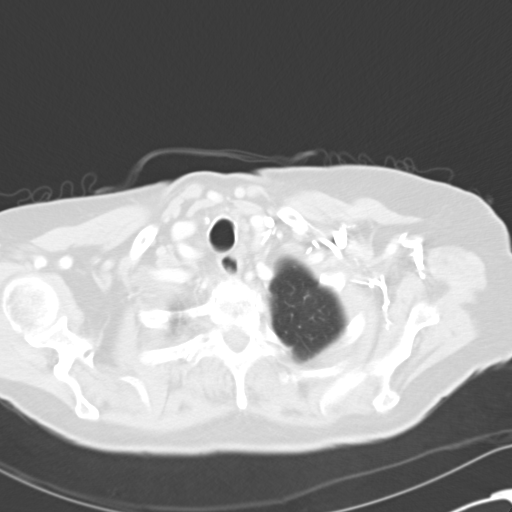

[Series 602: <mpr thick range> · coronal · 0.64mm/px · 3 of 84 slices shown]
[im 17/84  lung]
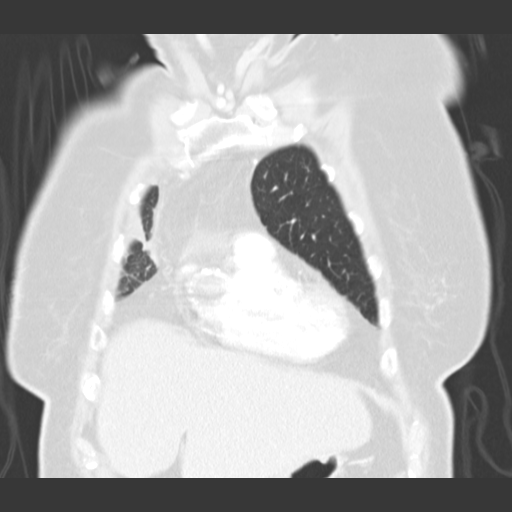
[im 34/84  lung]
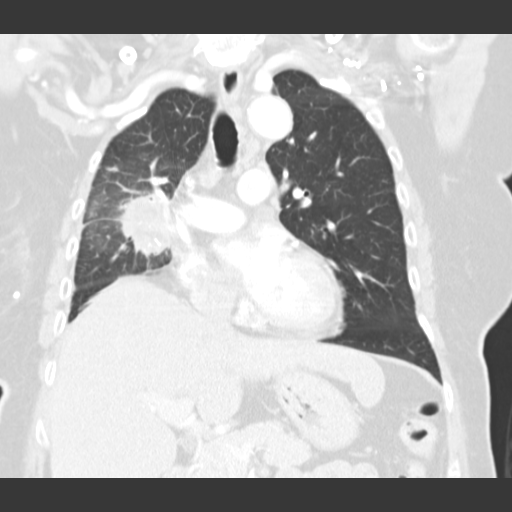
[im 50/84  lung]
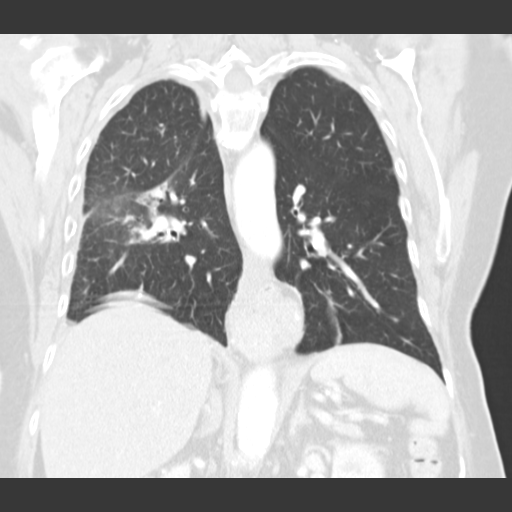

[15 of 36 positions shown; findings below may reference images not displayed]

FINDINGS: Soft tissue / Mediastinum: There is no axillary lymphadenopathy.
Upper normal left paratracheal lymph nodes are not changed in the
interval. The 8 mm index subcarinal lymph node measured on the
previous study has increased slightly to 10 mm in short axis on
today's exam. 10 mm short axis subcarinal lymph node on today's
study was 7 mm in short axis previously.

2.8 x 2.2 cm irregular soft tissue nodule just anterior to the right
hilum has increased in size in the interval. This area was measured
on lung windows previously but in the interval since the prior study
there has been development of perilesional airspace disease on the
current exam making reliable reproducible measurement on lung
windows difficult. For that reason, I have remeasured the lesion on
soft tissue windows on the previous exam and it measures 2.2 x
cm on that study.

No left hilar lymphadenopathy. The heart size is normal. Coronary
artery calcification is noted. Thoracic esophagus is unremarkable.
There is a small hiatal hernia.

Lungs / Pleura: Interval progression of patchy airspace disease
around the right lung lesion with areas of more peripheral nodular
airspace disease in the right lung today.

Subsegmental atelectasis or linear scarring in left lower lobe is
unchanged. Small right pleural effusion has progressed in the
interval.

Bones: Bone windows reveal no worrisome lytic or sclerotic osseous
lesions.

Upper Abdomen:  Unremarkable.
IMPRESSION: Interval increase in patchy and nodular airspace disease around the
known right lung mass. This makes comparative measurement on lung
windows today (the lesion was measured using lung windows
previously) unreliable. As such, the lesion has been measured today
and remeasured previously using soft tissue windows. The mass has
progressed in the interval measuring 2.8 x 2.2 cm today compared to
2.2 x 1.8 cm previously.

Increase in airspace disease around lesion may be related to tumor
spread or postobstructive changes.

Index 8 mm AP window lymph node measured on the previous study is
now 10 mm.

Slight increase in size of the small right pleural effusion.

## 2014-12-04 ENCOUNTER — Encounter (INDEPENDENT_AMBULATORY_CARE_PROVIDER_SITE_OTHER): Payer: Self-pay | Admitting: *Deleted

## 2016-04-02 ENCOUNTER — Other Ambulatory Visit: Payer: Self-pay | Admitting: Nurse Practitioner
